# Patient Record
Sex: Male | Born: 1950
Health system: Southern US, Community
[De-identification: ages and names within clinical notes are randomized; demographics above are authoritative.]

## PROBLEM LIST (undated history)

## (undated) DIAGNOSIS — C801 Malignant (primary) neoplasm, unspecified: Secondary | ICD-10-CM

## (undated) DIAGNOSIS — K6289 Other specified diseases of anus and rectum: Secondary | ICD-10-CM

## (undated) DIAGNOSIS — IMO0001 Reserved for inherently not codable concepts without codable children: Secondary | ICD-10-CM

## (undated) HISTORY — DX: Malignant (primary) neoplasm, unspecified: C80.1

## (undated) HISTORY — DX: Other specified diseases of anus and rectum: K62.89

## (undated) HISTORY — PX: PROSTATECTOMY: SHX69

## (undated) HISTORY — DX: Reserved for inherently not codable concepts without codable children: IMO0001

## (undated) HISTORY — PX: COLONOSCOPY: SHX174

---

## 2001-06-25 ENCOUNTER — Ambulatory Visit (HOSPITAL_COMMUNITY): Admission: RE | Admit: 2001-06-25 | Discharge: 2001-06-25 | Payer: Self-pay | Admitting: Internal Medicine

## 2001-06-25 ENCOUNTER — Encounter: Payer: Self-pay | Admitting: Internal Medicine

## 2001-08-06 ENCOUNTER — Encounter: Admission: RE | Admit: 2001-08-06 | Discharge: 2001-09-24 | Payer: Self-pay | Admitting: Orthopaedic Surgery

## 2003-05-07 ENCOUNTER — Emergency Department (HOSPITAL_COMMUNITY): Admission: EM | Admit: 2003-05-07 | Discharge: 2003-05-07 | Payer: Self-pay | Admitting: Emergency Medicine

## 2003-08-09 ENCOUNTER — Emergency Department (HOSPITAL_COMMUNITY): Admission: EM | Admit: 2003-08-09 | Discharge: 2003-08-09 | Payer: Self-pay | Admitting: Emergency Medicine

## 2005-05-05 ENCOUNTER — Ambulatory Visit: Payer: Self-pay | Admitting: Internal Medicine

## 2005-05-24 ENCOUNTER — Encounter (INDEPENDENT_AMBULATORY_CARE_PROVIDER_SITE_OTHER): Payer: Self-pay | Admitting: Internal Medicine

## 2005-06-02 ENCOUNTER — Ambulatory Visit: Payer: Self-pay | Admitting: Internal Medicine

## 2005-06-02 ENCOUNTER — Ambulatory Visit (HOSPITAL_COMMUNITY): Admission: RE | Admit: 2005-06-02 | Discharge: 2005-06-02 | Payer: Self-pay | Admitting: Internal Medicine

## 2006-09-05 ENCOUNTER — Ambulatory Visit: Payer: Self-pay | Admitting: Family Medicine

## 2006-10-23 ENCOUNTER — Ambulatory Visit: Payer: Self-pay | Admitting: Family Medicine

## 2007-06-11 ENCOUNTER — Ambulatory Visit (HOSPITAL_COMMUNITY): Admission: RE | Admit: 2007-06-11 | Discharge: 2007-06-11 | Payer: Self-pay | Admitting: Family Medicine

## 2007-10-28 ENCOUNTER — Ambulatory Visit: Payer: Self-pay | Admitting: Gastroenterology

## 2007-10-28 DIAGNOSIS — R1310 Dysphagia, unspecified: Secondary | ICD-10-CM | POA: Insufficient documentation

## 2007-10-29 ENCOUNTER — Ambulatory Visit: Payer: Self-pay | Admitting: Gastroenterology

## 2007-11-29 ENCOUNTER — Ambulatory Visit: Payer: Self-pay | Admitting: Gastroenterology

## 2007-11-29 DIAGNOSIS — K219 Gastro-esophageal reflux disease without esophagitis: Secondary | ICD-10-CM | POA: Insufficient documentation

## 2008-01-02 ENCOUNTER — Telehealth: Payer: Self-pay | Admitting: Gastroenterology

## 2008-03-24 ENCOUNTER — Telehealth: Payer: Self-pay | Admitting: Gastroenterology

## 2008-03-24 ENCOUNTER — Ambulatory Visit: Payer: Self-pay | Admitting: Gastroenterology

## 2008-03-24 ENCOUNTER — Ambulatory Visit (HOSPITAL_COMMUNITY): Admission: RE | Admit: 2008-03-24 | Discharge: 2008-03-24 | Payer: Self-pay | Admitting: Gastroenterology

## 2008-03-25 ENCOUNTER — Telehealth: Payer: Self-pay | Admitting: Gastroenterology

## 2008-03-31 ENCOUNTER — Ambulatory Visit (HOSPITAL_COMMUNITY): Admission: RE | Admit: 2008-03-31 | Discharge: 2008-03-31 | Payer: Self-pay | Admitting: Gastroenterology

## 2008-04-13 ENCOUNTER — Ambulatory Visit: Payer: Self-pay | Admitting: Gastroenterology

## 2008-06-01 ENCOUNTER — Ambulatory Visit: Payer: Self-pay | Admitting: Internal Medicine

## 2008-06-01 DIAGNOSIS — D126 Benign neoplasm of colon, unspecified: Secondary | ICD-10-CM | POA: Insufficient documentation

## 2008-06-01 DIAGNOSIS — M1990 Unspecified osteoarthritis, unspecified site: Secondary | ICD-10-CM | POA: Insufficient documentation

## 2008-06-01 DIAGNOSIS — J309 Allergic rhinitis, unspecified: Secondary | ICD-10-CM | POA: Insufficient documentation

## 2008-06-01 DIAGNOSIS — J189 Pneumonia, unspecified organism: Secondary | ICD-10-CM | POA: Insufficient documentation

## 2008-06-01 DIAGNOSIS — H9319 Tinnitus, unspecified ear: Secondary | ICD-10-CM | POA: Insufficient documentation

## 2008-06-01 LAB — CONVERTED CEMR LAB
Basophils Absolute: 0 10*3/uL (ref 0.0–0.1)
Basophils Relative: 0.6 % (ref 0.0–3.0)
CO2: 31 meq/L (ref 19–32)
Calcium: 8.9 mg/dL (ref 8.4–10.5)
Cholesterol: 159 mg/dL (ref 0–200)
Creatinine, Ser: 0.8 mg/dL (ref 0.4–1.5)
Eosinophils Absolute: 0.1 10*3/uL (ref 0.0–0.7)
GFR calc non Af Amer: 106 mL/min
HCT: 41.6 % (ref 39.0–52.0)
Hemoglobin: 14.1 g/dL (ref 13.0–17.0)
MCHC: 33.9 g/dL (ref 30.0–36.0)
MCV: 90.1 fL (ref 78.0–100.0)
Neutro Abs: 2.2 10*3/uL (ref 1.4–7.7)
PSA: 4.3 ng/mL — ABNORMAL HIGH (ref 0.10–4.00)
RBC: 4.62 M/uL (ref 4.22–5.81)

## 2008-06-03 ENCOUNTER — Encounter: Payer: Self-pay | Admitting: Internal Medicine

## 2008-07-06 ENCOUNTER — Ambulatory Visit: Payer: Self-pay | Admitting: Internal Medicine

## 2008-07-06 DIAGNOSIS — R972 Elevated prostate specific antigen [PSA]: Secondary | ICD-10-CM | POA: Insufficient documentation

## 2008-09-04 ENCOUNTER — Ambulatory Visit: Payer: Self-pay | Admitting: Endocrinology

## 2008-09-10 ENCOUNTER — Telehealth: Payer: Self-pay | Admitting: Endocrinology

## 2008-09-10 ENCOUNTER — Ambulatory Visit (HOSPITAL_COMMUNITY): Admission: RE | Admit: 2008-09-10 | Discharge: 2008-09-10 | Payer: Self-pay | Admitting: Endocrinology

## 2008-09-10 ENCOUNTER — Ambulatory Visit: Payer: Self-pay | Admitting: Endocrinology

## 2008-09-10 DIAGNOSIS — M549 Dorsalgia, unspecified: Secondary | ICD-10-CM | POA: Insufficient documentation

## 2009-03-22 ENCOUNTER — Ambulatory Visit: Payer: Self-pay | Admitting: Internal Medicine

## 2009-03-22 LAB — CONVERTED CEMR LAB
Anti Nuclear Antibody(ANA): NEGATIVE
PSA: 5.14 ng/mL — ABNORMAL HIGH (ref 0.10–4.00)
TSH: 1.48 microintl units/mL (ref 0.35–5.50)

## 2009-03-23 ENCOUNTER — Telehealth: Payer: Self-pay | Admitting: Internal Medicine

## 2009-05-07 ENCOUNTER — Encounter: Payer: Self-pay | Admitting: Internal Medicine

## 2009-06-14 ENCOUNTER — Encounter: Payer: Self-pay | Admitting: Internal Medicine

## 2009-07-14 ENCOUNTER — Encounter: Payer: Self-pay | Admitting: Internal Medicine

## 2009-08-05 ENCOUNTER — Encounter: Payer: Self-pay | Admitting: Internal Medicine

## 2009-09-21 DIAGNOSIS — C801 Malignant (primary) neoplasm, unspecified: Secondary | ICD-10-CM

## 2009-09-21 HISTORY — DX: Malignant (primary) neoplasm, unspecified: C80.1

## 2009-10-07 ENCOUNTER — Emergency Department (HOSPITAL_COMMUNITY)
Admission: EM | Admit: 2009-10-07 | Discharge: 2009-10-07 | Payer: Self-pay | Source: Home / Self Care | Admitting: Emergency Medicine

## 2010-05-19 ENCOUNTER — Encounter (INDEPENDENT_AMBULATORY_CARE_PROVIDER_SITE_OTHER): Payer: Self-pay | Admitting: *Deleted

## 2010-07-06 ENCOUNTER — Ambulatory Visit: Payer: Self-pay | Admitting: Internal Medicine

## 2010-08-04 ENCOUNTER — Ambulatory Visit (HOSPITAL_COMMUNITY)
Admission: RE | Admit: 2010-08-04 | Discharge: 2010-08-04 | Payer: Self-pay | Source: Home / Self Care | Attending: Internal Medicine | Admitting: Internal Medicine

## 2010-08-04 ENCOUNTER — Ambulatory Visit: Admit: 2010-08-04 | Payer: Self-pay | Admitting: Internal Medicine

## 2010-08-04 HISTORY — PX: COLONOSCOPY: SHX174

## 2010-08-14 ENCOUNTER — Encounter: Payer: Self-pay | Admitting: Endocrinology

## 2010-08-22 NOTE — Op Note (Signed)
  NAMEKERIN, CECCHI              ACCOUNT NO.:  [id number]  MEDICAL RECORD NO.:  [id number]          PATIENT TYPE:  AMB  LOCATION:  DAY                           FACILITY:  [facility]  PHYSICIAN:  Lionel December, M.D.    DATE OF BIRTH:  1951-06-03  DATE OF PROCEDURE:  08/04/2010 DATE OF DISCHARGE:  08/04/2010                              OPERATIVE REPORT   PROCEDURE:  Colonoscopy.  INDICATION:  David Pham is a 60 year old Caucasian male who has recurrent hematochezia felt to be secondary to hemorrhoid.  He denies diarrhea and/or constipation.  His last colonoscopy was about 6 years ago.  FAMILY HISTORY:  Negative for colorectal carcinoma.  PERSONAL HISTORY:  Positive for prostate CA for which he had radical prostatectomy in March of last year and is doing well.  Procedure risks were reviewed with the patient and informed consent was obtained.  MEDICATIONS FOR CONSCIOUS SEDATION: 1. Demerol 50 mg IV. 2. Versed 6 mg IV in divided dose.FINDINGS:  Procedure performed in endoscopy suite.  The patient's vital signs and O2 sats were monitored during the procedure and remained stable.  The patient was placed in left lateral recumbent position and rectal examination performed.  No abnormality noted on external or digital exam.  Pentax videoscope was placed through rectum and advanced under vision into sigmoid colon beyond.  Preparation was excellent. Scope was passed into cecum which was identified by appendiceal orifice and ileocecal valve.  Pictures were taken for the record.  As the scope was withdrawn, colonic mucosa was carefully examined and was normal throughout.  Rectal mucosa similarly was normal.  Scope was retroflexed to examine anorectal junction and small to moderate size hemorrhoids noted below the dentate line.  Endoscope was straightened and withdrawn. Withdrawal time was 9 minutes.  The patient tolerated the procedure well.  FINAL DIAGNOSIS:  External hemorrhoids, otherwise  normal colonoscopy.  RECOMMENDATIONS: 1. High-fiber diet plus fiber supplement 3-4 g daily. 2. Mycolog II cream to be applied to perianal area/anal canal twice a     day for 2 weeks thereafter p.r.n.  Prescription given for 60 g.  If     patient's hematochezia persist, he may need further intervention.  Thank you very much.     Lionel December, M.D.     NR/MEDQ  D:  08/04/2010  T:  08/05/2010  Job:  [id number]  cc:   Lorin Picket A. Gerda Diss, MD Fax: [phone]  Electronically Signed by Lionel December M.D. on 08/22/2010 07:18:49 AM

## 2010-08-23 NOTE — Letter (Signed)
Summary: Recall, Screening Colonoscopy Only  [facility]  [address]   [place], [place] [address]   Phone: [phone]  Fax: [phone]    May 19, 2010  CAEDON BOND [address] [place], [place]  [address] 1950-08-08   Dear Mr. GEIMAN,   Our records indicate it is time to schedule your colonoscopy.    Please call our office at [phone] and ask for the nurse.   Thank you,    Hendricks Limes, LPN Cloria Spring, LPN  [facility] Ph: [phone]   Fax: [phone]   Appended Document: Recall, Screening Colonoscopy Only Patient called an said he will be following Dr Karilyn Cota

## 2010-08-23 NOTE — Letter (Signed)
Summary: [facility]   Imported By: Sherian Rein 08/09/2009 13:53:11  _____________________________________________________________________  External Attachment:    Type:   Image     Comment:   External Document

## 2010-08-23 NOTE — Letter (Signed)
Summary: Office Visit / [facility]   Office Visit / [facility]   Imported By: Lennie Odor 07/26/2009 14:10:58  _____________________________________________________________________  External Attachment:    Type:   Image     Comment:   External Document

## 2010-12-06 NOTE — Assessment & Plan Note (Signed)
[facility]                         GASTROENTEROLOGY OFFICE NOTE   JOSPEH, MANGEL                     MRN:          [id number]  DATE:10/28/2007                            DOB:          1950/11/05    REASON FOR CONSULTATION:  Cough and dysphagia.   Mr. Birdwell is a 60 year old white male referred through the courtesy of  Dr. Andi Devon for evaluation.  Mr. Liew complains with a constant need  to clear his throat.  He has had burning and indigestion which is  greatly improved with Nexium.  He does have dysphagia to solids and  liquids where he describes solids taking a long time to go down.  He  also apparently has a history of colon polyps and was last examined 3  years ago.   PAST MEDICAL HISTORY:  Unremarkable.   FAMILY HISTORY:  Noncontributory.   MEDICATIONS:  Include Nexium, baby aspirin, Afrin spray.    He is allergic to PENICILLIN.   He does not smoke.  He drinks rarely.  He is single, is a Naval architect.   REVIEW OF SYSTEMS:  Positive for joint pains and some tinnitus and  muscle cramps.   PHYSICAL EXAMINATION:  Pulse 72.  Blood pressure 122/74.  Weight 167.  HEENT:  EOMI.  PERRLA.  Sclerae are anicteric.  Conjunctivae are pink.  NECK:  Supple without thyromegaly, adenopathy or carotid bruits.  CHEST:  Clear to auscultation and percussion without adventitious  sounds.  CARDIAC:  Regular rhythm; normal S1 S2.  There are no murmurs, gallops  or rubs.  ABDOMEN:  Bowel sounds are normoactive.  Abdomen is soft, nontender and  nondistended.  There are no abdominal masses, tenderness, splenic  enlargement or hepatomegaly.  EXTREMITIES:  Full range of motion.  No cyanosis, clubbing or edema.  RECTAL:  Deferred.   IMPRESSION:  1. Gastroesophageal reflux disease.  2. Throat disturbances.  This certainly could be related to      gastroesophageal reflux disease.  Postnasal drip is also a concern.  3. Dysphagia - rule out esophageal  stricture.  4. History of colon polyps.   RECOMMENDATION:  1. Continue Nexium.  2. Upper endoscopy with dilation as indicated.  3. A followup colonoscopy in approximately 2 years.     Barbette Hair. Arlyce Dice, MD,FACG  Electronically Signed    RDK/MedQ  DD: 10/28/2007  DT: 10/28/2007  Job #: [id number]   cc:   Gabriel Earing, M.D.

## 2010-12-06 NOTE — Assessment & Plan Note (Signed)
[facility]                                 ON-CALL NOTE   MALIKIAH, DEBARR                       MRN:          [id number]  DATE:03/25/2008                            DOB:          Aug 21, 1950    Mr. Pruss called tonight stating he has abdominal bloating.  He  underwent a esophageal dilatation by myself yesterday.  He has no  abdominal pain.  He has minimal dysphagia.   I instructed him to take Mylanta-II or Maalox plus, and to call the  office in 3-4 days if he is not improved.     Barbette Hair. Arlyce Dice, MD,FACG  Electronically Signed    RDK/MedQ  DD: 03/25/2008  DT: 03/26/2008  Job #: [id number]

## 2010-12-06 NOTE — Letter (Signed)
October 28, 2007    David Pham   RE:  ZAMERE, PASTERNAK  MRN:  [id number]  /  DOB:  12/02/1950   Dear [name]:   It is my pleasure to have treated you recently as a new patient in my  office.  I appreciate your confidence and the opportunity to participate  in your care.   Since I do have a busy inpatient endoscopy schedule and office schedule,  my office hours vary weekly.  I am, however, available for emergency  calls every day through my office.  If I cannot promptly meet an urgent  office appointment, another one of our gastroenterologists will be able  to assist you.   My well-trained staff are prepared to help you at all times.  For  emergencies after office hours, a physician from our gastroenterology  section is always available through my 24-hour answering service.   While you are under my care, I encourage discussion of your questions  and concerns, and I will be happy to return your calls as soon as I am  available.   Once again, I welcome you as a new patient and I look forward to a happy  and healthy relationship.    Sincerely,      Barbette Hair. Arlyce Dice, MD,FACG  Electronically Signed   RDK/MedQ  DD: 10/28/2007  DT: 10/28/2007  Job #: [id number]

## 2010-12-06 NOTE — Letter (Signed)
October 28, 2007    Isaiah Serge   RE:  SAMAJ, WESSELLS  MRN:  [id number]  /  DOB:  03-05-1951   Dear Mr. Hart Rochester:   It is my pleasure to have treated you recently as a new patient in my  office.  I appreciate your confidence and the opportunity to participate  in your care.   Since I do have a busy inpatient endoscopy schedule and office schedule,  my office hours vary weekly.  I am, however, available for emergency  calls every day through my office.  If I cannot promptly meet an urgent  office appointment, another one of our gastroenterologists will be able  to assist you.   My well-trained staff are prepared to help you at all times.  For  emergencies after office hours, a physician from our gastroenterology  section is always available through my 24-hour answering service.   While you are under my care, I encourage discussion of your questions  and concerns, and I will be happy to return your calls as soon as I am  available.   Once again, I welcome you as a new patient and I look forward to a happy  and healthy relationship.    Sincerely,      Barbette Hair. Arlyce Dice, MD,FACG  Electronically Signed   RDK/MedQ  DD: 10/28/2007  DT: 10/28/2007  Job #: [id number]

## 2010-12-06 NOTE — Letter (Signed)
October 28, 2007    Gabriel Earing, M.D.  [address]  [place], [place] [address]   RE:  David Pham, David Pham  MRN:  [id number]  /  DOB:  1951/02/15   Dear Dr. Andi Devon:   Upon your kind referral, I had the pleasure of evaluating your patient  and I am pleased to offer my findings.  I saw David Pham in the  office today.  Enclosed is a copy of my progress note that details my  findings and recommendations.   Thank you for the opportunity to participate in your patient's care.    Sincerely,      Barbette Hair. Arlyce Dice, MD,FACG  Electronically Signed    RDK/MedQ  DD: 10/28/2007  DT: 10/28/2007  Job #: [id number]

## 2010-12-09 NOTE — Op Note (Signed)
David Pham, David Pham              ACCOUNT NO.:  [id number]   MEDICAL RECORD NO.:  [id number]          PATIENT TYPE:  AMB   LOCATION:  DAY                           FACILITY:  [facility]   PHYSICIAN:  Lionel December, M.D.    DATE OF BIRTH:  03-Jul-1951   DATE OF PROCEDURE:  06/02/2005  DATE OF DISCHARGE:                                 OPERATIVE REPORT   PROCEDURE:  Colonoscopy.   INDICATIONS:  David Pham is a 60 year old Caucasian male with intermittent  rectal pain who also has history of colonic adenomas. Last exam was in July  2001. He is undergoing both surveillance and diagnostic colonoscopy.  Procedure risks were reviewed with the patient, and informed consent was  obtained.   PREMEDICATION:  Demerol 25 mg IV, Versed 4 mg IV.   FINDINGS:  Procedure performed in endoscopy suite. The patient's vital signs  and O2 saturation were monitored during the procedure and remained stable.  The patient was placed in left lateral position and rectal examination  performed. No abnormality noted on external or digital exam. Olympus  videoscope was placed in rectum and advanced under vision into sigmoid colon  and beyond. Preparation was satisfactory. She had stool in some areas which  had to be washed away with sterile water. Scope was passed into cecum which  was identified by ileocecal valve and appendiceal orifice. Pictures taken  for the record. As the scope was withdrawn, colonic mucosa was carefully  examined. There was a small polyp at rectosigmoid junction which was ablated  via cold biopsy. Rectal mucosa was normal. Scope was retroflexed to examine  anorectal junction, and small hemorrhoids were noted below the dentate line.  Endoscope was straightened and withdrawn. The patient tolerated the  procedure well.   FINAL DIAGNOSES:  1.  Small polyp ablated via cold biopsy from rectosigmoid junction.  2.  Small external hemorrhoids.  3.  I suspect he may be experiencing rectal spasms.   RECOMMENDATIONS:  1.  High-fiber diet.  2.  FiberChoice chew 2 tablets q.d.  3.  Levsin SL t.i.d. p.r.n. pain.  4.  I will be contacting the patient with biopsy results and further      recommendations.      Lionel December, M.D.  Electronically Signed     NR/MEDQ  D:  06/02/2005  T:  06/02/2005  Job:  [id number]   cc:   Wende Crease, M.D.  [place], [place]

## 2010-12-09 NOTE — Consult Note (Signed)
David Pham, David Pham              ACCOUNT NO.:  [id number]   MEDICAL RECORD NO.:  [id number]          PATIENT TYPE:  AMB   LOCATION:                                FACILITY:  [facility]   PHYSICIAN:  Lionel December, M.D.    DATE OF BIRTH:  06/14/1951   DATE OF CONSULTATION:  05/05/2005  DATE OF DISCHARGE:                                   CONSULTATION   CHIEF COMPLAINT:  Rectal pain, history of colon polyps.   REQUESTING PHYSICIAN:  Dr. Doyne Keel.   HISTORY OF PRESENT ILLNESS:  The patient is a 60 year old Caucasian  gentleman who presents today for further evaluation of the above stated  symptoms. He has a history of small colonic adenomas removed by Dr. Karilyn Cota  in July 2001. At that time, he also had an EGD which revealed mild changes  of reflux esophagitis and gastritis. Helicobacter pylori was positive, and  he was treated. He also had a normal terminal ileoscopy. Over the last  couple of months, he has had some problems with rectal pain. He was treated  for prostatitis, and the patient seemed to get some better, but he continues  to have mild symptoms. He has had some hematochezia which he feels is  related to hemorrhoids. His bowel movements are regular. No abdominal pain,  nausea or vomiting. Heartburn controlled on Nexium. No dysphagia,  odynophagia.   CURRENT MEDICATIONS:  1.  Tylenol 1 to 2 p.r.n.  2.  Multivitamin.  3.  Vitamin E daily.  4.  Aspirin 81 mg daily.  5.  Nexium 40 mg daily.   ALLERGIES:  PENICILLIN.   PAST MEDICAL HISTORY:  1.  Sinusitis.  2.  Gastroesophageal reflux disease. He gives a history of ulcers. Last EGD      and colonoscopy as outlined above in 2001.   FAMILY HISTORY:  Negative for colorectal cancer or chronic GI illnesses.   SOCIAL HISTORY:  He is divorced and has two sons. He is employed with [employer] services. He does not smoke, but he does chew tobacco. He drinks beer  every couple of days, usually one or two at a time. He does have a  history  of heavy alcohol use in the past.   REVIEW OF SYSTEMS:  GASTROINTESTINAL:  See HPI for GI. CONSTITUTIONAL:  No  weight loss. CARDIOPULMONARY:  No chest pain or shortness of breath.   PHYSICAL EXAMINATION:  VITAL SIGNS:  Weight 164, height 5 feet 11.  Temperature 97.7, blood pressure 124/82, pulse 84.  GENERAL:  Pleasant, well-developed, well-nourished, Caucasian male in no  acute distress.  SKIN:  Warm and dry. No jaundice.  HEENT:  Pupils are equal, round, and reactive to light. Conjunctivae are  pink. Sclerae are nonicteric. Oropharyngeal mucosa moist and pink. No  lesions, erythema, or exudate. No lymphadenopathy or thyromegaly.  LUNGS:  Clear to auscultation.  CARDIAC:  Reveals regular rate and rhythm. Normal S1 and S2. No murmurs,  rubs, or gallops.  ABDOMEN:  Positive bowel sounds, soft, nontender, nondistended. No  organomegaly or masses. No rebound tenderness or guarding. No abdominal  bruits  or hernias.  EXTREMITIES:  No edema.  RECTAL:  Deferred.   IMPRESSION:  The patient is a 60 year old Caucasian gentleman with history  of rectal pain, intermittent hematochezia possibly due to benign anorectal  source, history of colonic adenomas who presents today for possible  colonoscopy. It has been five years since his last exam, and it would be  reasonable at this time given his rectal pain to proceed with colonoscopy  for further evaluation as well surveillance purposes. Gastroesophageal  reflux disease, well controlled on Nexium.   PLAN:  1.  Colonoscopy in the near future.  2.  He is to continue Nexium for his acid reflux disease.   I would like to thank Dr. Doyne Keel for allowing [place] to take part in the care  of this patient.      Tana Coast, P.A.      Lionel December, M.D.  Electronically Signed    LL/MEDQ  D:  05/05/2005  T:  05/05/2005  Job:  [id number]

## 2011-03-15 ENCOUNTER — Encounter (INDEPENDENT_AMBULATORY_CARE_PROVIDER_SITE_OTHER): Payer: Self-pay

## 2011-05-15 ENCOUNTER — Ambulatory Visit (HOSPITAL_COMMUNITY)
Admission: RE | Admit: 2011-05-15 | Discharge: 2011-05-15 | Disposition: A | Discharge status: Home / Self Care | Payer: Managed Care, Other (non HMO) | Source: Ambulatory Visit | Attending: Family Medicine | Admitting: Family Medicine

## 2011-05-15 ENCOUNTER — Other Ambulatory Visit: Payer: Self-pay | Admitting: Family Medicine

## 2011-05-15 DIAGNOSIS — R059 Cough, unspecified: Secondary | ICD-10-CM

## 2011-05-15 DIAGNOSIS — R05 Cough: Secondary | ICD-10-CM | POA: Insufficient documentation

## 2013-05-24 ENCOUNTER — Encounter: Payer: Self-pay | Admitting: *Deleted

## 2013-05-27 ENCOUNTER — Ambulatory Visit (INDEPENDENT_AMBULATORY_CARE_PROVIDER_SITE_OTHER): Payer: Managed Care, Other (non HMO) | Admitting: Family Medicine

## 2013-05-27 ENCOUNTER — Encounter: Payer: Self-pay | Admitting: Family Medicine

## 2013-05-27 VITALS — BP 120/80 | Ht 68.0 in | Wt 172.0 lb

## 2013-05-27 DIAGNOSIS — D2360 Other benign neoplasm of skin of unspecified upper limb, including shoulder: Secondary | ICD-10-CM

## 2013-05-27 DIAGNOSIS — D2362 Other benign neoplasm of skin of left upper limb, including shoulder: Secondary | ICD-10-CM

## 2013-05-27 DIAGNOSIS — R5381 Other malaise: Secondary | ICD-10-CM

## 2013-05-27 DIAGNOSIS — M79609 Pain in unspecified limb: Secondary | ICD-10-CM

## 2013-05-27 DIAGNOSIS — M79605 Pain in left leg: Secondary | ICD-10-CM

## 2013-05-27 DIAGNOSIS — M79604 Pain in right leg: Secondary | ICD-10-CM

## 2013-05-27 DIAGNOSIS — Z79899 Other long term (current) drug therapy: Secondary | ICD-10-CM

## 2013-05-27 DIAGNOSIS — E785 Hyperlipidemia, unspecified: Secondary | ICD-10-CM

## 2013-05-27 MED ORDER — MELOXICAM 15 MG PO TABS: 15.0000 mg | ORAL_TABLET | Freq: Every day | ORAL | Status: DC

## 2013-05-27 NOTE — Progress Notes (Signed)
  Subjective:    Patient ID: David Pham, male    DOB: 1951-01-25, 62 y.o.   MRN: [id number]  Leg Pain  The incident occurred more than 1 week ago. There was no injury mechanism. The pain is present in the left leg and right leg. The quality of the pain is described as aching. The pain is at a severity of 4/10. The pain is moderate. The pain has been intermittent since onset. He reports no foreign bodies present. Nothing aggravates the symptoms. He has tried acetaminophen for the symptoms. The treatment provided no relief.   Patient states that he has a wart on his left arm. It has been present for about 1-2 months.   Patient also has had surgery for kidney stones in May or June of this year.   Patient denies any injury. Nothing he knows his caused it.  Review of Systems He relates bilateral lower leg pain around the lower calf around the ankles around the feet denies any injury denies any other particular troubles.    Objective:   Physical Exam Lungs are clear hearts regular lower spine normal pulses present in both feet calves nontender knees are normal       Assessment & Plan:  I doubt claudication. I believe there is a possibility that there could be some underlying spinal stenosis present that could be causing some of this issue. I do not feel that the patient needs any type of intervention currently other than trying anti-inflammatory for the next month if he's not doing better with that then consider MRI of the lumbar spine especially if symptoms are worse  Labs ordered followup in 3-4 weeks  Patient also has a abnormal area on his arm that I believe isa acanthoma, needs dermatology referral

## 2013-06-17 LAB — CBC WITH DIFFERENTIAL/PLATELET
Basophils Absolute: 0 10*3/uL (ref 0.0–0.1)
Lymphocytes Relative: 26 % (ref 12–46)
Lymphs Abs: 1.5 10*3/uL (ref 0.7–4.0)
Neutro Abs: 3.6 10*3/uL (ref 1.7–7.7)
Neutrophils Relative %: 64 % (ref 43–77)
Platelets: 197 10*3/uL (ref 150–400)
RBC: 4.5 MIL/uL (ref 4.22–5.81)
RDW: 14 % (ref 11.5–15.5)
WBC: 5.7 10*3/uL (ref 4.0–10.5)

## 2013-06-18 LAB — LIPID PANEL
HDL: 35 mg/dL — ABNORMAL LOW (ref >39)
LDL Cholesterol: 81 mg/dL (ref 0–99)
Total CHOL/HDL Ratio: 4.6 Ratio

## 2013-06-18 LAB — BASIC METABOLIC PANEL
CO2: 27 mEq/L (ref 19–32)
Calcium: 9 mg/dL (ref 8.4–10.5)
Glucose, Bld: 87 mg/dL (ref 70–99)
Potassium: 3.7 mEq/L (ref 3.5–5.3)
Sodium: 141 mEq/L (ref 135–145)

## 2013-06-18 LAB — TSH: TSH: 1.649 u[IU]/mL (ref 0.350–4.500)

## 2013-06-24 ENCOUNTER — Encounter: Payer: Self-pay | Admitting: Family Medicine

## 2013-06-24 ENCOUNTER — Ambulatory Visit (INDEPENDENT_AMBULATORY_CARE_PROVIDER_SITE_OTHER): Payer: Managed Care, Other (non HMO) | Admitting: Family Medicine

## 2013-06-24 VITALS — BP 132/82 | Ht 68.0 in | Wt 173.0 lb

## 2013-06-24 DIAGNOSIS — E785 Hyperlipidemia, unspecified: Secondary | ICD-10-CM

## 2013-06-24 DIAGNOSIS — M79605 Pain in left leg: Secondary | ICD-10-CM

## 2013-06-24 DIAGNOSIS — M79609 Pain in unspecified limb: Secondary | ICD-10-CM

## 2013-06-24 NOTE — Progress Notes (Signed)
   Subjective:    Patient ID: David Pham, male    DOB: 09-15-1950, 62 y.o.   MRN: [id number]  HPI Patient arrives for a follow up on leg pain and to discuss lab results. Patient stated he was not fasting when he had cholesterol test drawn.   25 minutes spent discussing cholesterol. Also has leg pain comes and goes and that he could go ahead with the Mobic to help out with with. He also had his lab work discussed with him today in detail. Patient denies rectal bleeding chest tightness pressure pain shortness breath. Review of Systems      Objective:   Physical Exam Patient had a small spot on the left tonsil he states he is seen in ENT in the past for this he wanted to make sure it still looked okay. It looks sebaceous but no sign of any type of tumor. It's approximately 3 mm x 3 mm on the left tonsillar region. I told him that we would recheck this again when he follows up in 6 months if he gets bigger or causes any pain followup sooner.  Lungs clear heart regular pulse normal blood pressure good abdomen soft       Assessment & Plan:  Tonsillar area will recheck in 6 months Triglycerides somewhat elevated because he was not fasting when he had this checked otherwise cholesterol looked good Metabolic 7 lipid kidney functions look good blood pressure good Followup 6 months He may use MOBIC for leg pain prepped a 3-4 weeks if any systemic effects from the Mobic stop it

## 2013-10-27 ENCOUNTER — Ambulatory Visit (INDEPENDENT_AMBULATORY_CARE_PROVIDER_SITE_OTHER): Payer: Managed Care, Other (non HMO) | Admitting: Family Medicine

## 2013-10-27 ENCOUNTER — Encounter: Payer: Self-pay | Admitting: Family Medicine

## 2013-10-27 VITALS — BP 132/92 | Ht 68.0 in | Wt 172.0 lb

## 2013-10-27 DIAGNOSIS — D229 Melanocytic nevi, unspecified: Secondary | ICD-10-CM

## 2013-10-27 DIAGNOSIS — D239 Other benign neoplasm of skin, unspecified: Secondary | ICD-10-CM

## 2013-10-27 DIAGNOSIS — Z0000 Encounter for general adult medical examination without abnormal findings: Secondary | ICD-10-CM

## 2013-10-27 DIAGNOSIS — J309 Allergic rhinitis, unspecified: Secondary | ICD-10-CM

## 2013-10-27 NOTE — Progress Notes (Signed)
   Subjective:    Patient ID: David Pham, male    DOB: 10-22-1950, 63 y.o.   MRN: [id number]  HPI Patient is here today for a wellness exam.   Patient has been dealing with sinus issues as well. The patient comes in today for a wellness visit.  A review of their health history was completed.  A review of medications was also completed. Any necessary refills were discussed. Sensible healthy diet was discussed. Importance of minimizing excessive salt and carbohydrates was also discussed. Safety was stressed including driving, activities at work and at home where applicable. Importance of regular physical activity for overall health was discussed. Preventative measures appropriate for age were discussed. Time was spent with the patient discussing any concerns they have about their well-being.    Review of Systems  Constitutional: Negative for fever, activity change and appetite change.  HENT: Negative for congestion and rhinorrhea.   Eyes: Negative for discharge.  Respiratory: Positive for cough. Negative for wheezing.   Cardiovascular: Negative for chest pain.  Gastrointestinal: Negative for vomiting, abdominal pain and blood in stool.  Genitourinary: Negative for frequency and difficulty urinating.  Musculoskeletal: Negative for neck pain.  Skin: Negative for rash.  Allergic/Immunologic: Negative for environmental allergies and food allergies.  Neurological: Negative for weakness and headaches.  Psychiatric/Behavioral: Negative for agitation.       Objective:   Physical Exam  Constitutional: He appears well-developed and well-nourished.  HENT:  Head: Normocephalic and atraumatic.  Right Ear: External ear normal.  Left Ear: External ear normal.  Nose: Nose normal.  Mouth/Throat: Oropharynx is clear and moist.  Eyes: EOM are normal. Pupils are equal, round, and reactive to light.  Neck: Normal range of motion. Neck supple. No thyromegaly present.  Cardiovascular:  Normal rate, regular rhythm and normal heart sounds.   No murmur heard. Pulmonary/Chest: Effort normal and breath sounds normal. No respiratory distress. He has no wheezes.  Abdominal: Soft. Bowel sounds are normal. He exhibits no distension and no mass. There is no tenderness.  Genitourinary: Penis normal.  Musculoskeletal: Normal range of motion. He exhibits no edema.  Lymphadenopathy:    He has no cervical adenopathy.  Neurological: He is alert. He exhibits normal muscle tone.  Skin: Skin is warm and dry. No erythema.  Psychiatric: He has a normal mood and affect. His behavior is normal. Judgment normal.   Oral exam thoroughly done no masses or growths sore eructations were seen patient has been having some postnasal drip with some coughing. He will be using allergy medicines along with omeprazole to see feeding keep it under control. No need for any tests currently.  Patient also has abnormal mole on the right posterior back I recommend that he sees dermatology to have this removed.     Assessment & Plan:  Abnormal mole on the right side of his back I recommend he sees Dr. Nevada Crane for removal of this. Wellness exam completed dietary measures safety measures all discussed. Laboratory work ordered awaiting results Up-to-date on colonoscopy currently Followed by urologist had prostatectomy in 2011.

## 2013-10-30 ENCOUNTER — Encounter: Payer: Self-pay | Admitting: Family Medicine

## 2013-10-30 LAB — HEPATIC FUNCTION PANEL
ALT: 17 U/L (ref 0–53)
AST: 16 U/L (ref 0–37)
Albumin: 3.8 g/dL (ref 3.5–5.2)
Alkaline Phosphatase: 59 U/L (ref 39–117)
BILIRUBIN DIRECT: 0.1 mg/dL (ref 0.0–0.3)
Indirect Bilirubin: 0.6 mg/dL (ref 0.2–1.2)
Total Bilirubin: 0.7 mg/dL (ref 0.2–1.2)
Total Protein: 6.3 g/dL (ref 6.0–8.3)

## 2013-10-30 LAB — LIPID PANEL
Cholesterol: 154 mg/dL (ref 0–200)
HDL: 37 mg/dL — ABNORMAL LOW (ref >39)
LDL Cholesterol: 95 mg/dL (ref 0–99)
Total CHOL/HDL Ratio: 4.2 Ratio
Triglycerides: 109 mg/dL (ref <150)
VLDL: 22 mg/dL (ref 0–40)

## 2013-10-30 LAB — BASIC METABOLIC PANEL
BUN: 11 mg/dL (ref 6–23)
CHLORIDE: 104 meq/L (ref 96–112)
CO2: 27 mEq/L (ref 19–32)
Calcium: 8.9 mg/dL (ref 8.4–10.5)
Creat: 0.85 mg/dL (ref 0.50–1.35)
Glucose, Bld: 87 mg/dL (ref 70–99)
POTASSIUM: 4 meq/L (ref 3.5–5.3)
Sodium: 141 mEq/L (ref 135–145)

## 2013-11-04 ENCOUNTER — Encounter: Payer: Self-pay | Admitting: Family Medicine

## 2014-04-07 ENCOUNTER — Ambulatory Visit: Payer: Managed Care, Other (non HMO) | Admitting: Family Medicine

## 2014-05-11 ENCOUNTER — Encounter: Payer: Self-pay | Admitting: Family Medicine

## 2014-05-11 ENCOUNTER — Ambulatory Visit (INDEPENDENT_AMBULATORY_CARE_PROVIDER_SITE_OTHER): Payer: Managed Care, Other (non HMO) | Admitting: Family Medicine

## 2014-05-11 VITALS — BP 140/90 | Ht 68.0 in | Wt 167.0 lb

## 2014-05-11 DIAGNOSIS — M545 Low back pain, unspecified: Secondary | ICD-10-CM

## 2014-05-11 DIAGNOSIS — Z23 Encounter for immunization: Secondary | ICD-10-CM

## 2014-05-11 DIAGNOSIS — Z9079 Acquired absence of other genital organ(s): Secondary | ICD-10-CM

## 2014-05-11 DIAGNOSIS — Z9889 Other specified postprocedural states: Secondary | ICD-10-CM

## 2014-05-11 NOTE — Progress Notes (Signed)
   Subjective:    Patient ID: David Pham, male    DOB: Oct 28, 1950, 63 y.o.   MRN: [id number]  HPI Patient is here today for chronic low back pain. 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 Patient states he has intermittent stiffness in his lower back. Patient denies radiation down the legs.  Patient would also like the flu shot.   Having sinus issues as well.  Denies high fever chills sweats denies severe headaches nausea or vomiting   Review of Systems  Constitutional: Negative for fever and activity change.  HENT: Positive for congestion and rhinorrhea. Negative for ear pain.   Eyes: Negative for discharge.  Respiratory: Positive for cough. Negative for wheezing.   Cardiovascular: Negative for chest pain.       Objective:   Physical Exam Lungs are clear heart reveals normal neck no masses eardrums normal throat normal sinus normal lumbar area no tenderness negative straight leg raise       Assessment & Plan:  Back pain-musculoskeletal-I find no evidence of any type of herniated disc I don't recommend imaging. I recommended this patient do stretching exercises may use anti-inflammatory when necessary I recommended Aleve when necessary  Mild sinus issues viral no antibiotics necessary flu shot today

## 2014-10-22 ENCOUNTER — Ambulatory Visit (INDEPENDENT_AMBULATORY_CARE_PROVIDER_SITE_OTHER): Payer: Managed Care, Other (non HMO) | Admitting: Family Medicine

## 2014-10-22 ENCOUNTER — Encounter: Payer: Self-pay | Admitting: Family Medicine

## 2014-10-22 VITALS — BP 132/86 | Ht 68.0 in | Wt 167.8 lb

## 2014-10-22 DIAGNOSIS — M7542 Impingement syndrome of left shoulder: Secondary | ICD-10-CM | POA: Diagnosis not present

## 2014-10-22 MED ORDER — METHYLPREDNISOLONE ACETATE 40 MG/ML IJ SUSP: 40.0000 mg | Freq: Once | INTRAMUSCULAR | Status: DC

## 2014-10-22 NOTE — Progress Notes (Signed)
   Subjective:    Patient ID: David Pham, male    DOB: 1950-10-16, 64 y.o.   MRN: [id number]  HPI Patient arrives with complaint of left shoulder pain for 3 weeks.  Does a lot of pulling and lifting with job  Recalls no injury  grts a bad deep ache  Hurts with certain motions radiated to mid arm  Hx of cortisone injec in shoulder   Used tylenol prn  mostlly right handed   Review of Systems No headache no chest pain no back pain    Objective:   Physical Exam  Alert vital stable no acute distress lungs clear heart rhythm H&T normal left shoulder positive impingement sign. Positive pain with rotation  Patient was prepped draped injected with 1 mL Depo-Medrol and 2 mL Xylocaine left shoulder      Assessment & Plan:  Impression subacute shoulder pain. Patient cannot tolerate anti-inflammatories. 1 and trial of injection. To follow-up soon for checkup with Dr. Nicki Reaper

## 2014-10-29 ENCOUNTER — Encounter: Payer: Self-pay | Admitting: Family Medicine

## 2014-10-29 ENCOUNTER — Ambulatory Visit (INDEPENDENT_AMBULATORY_CARE_PROVIDER_SITE_OTHER): Payer: Managed Care, Other (non HMO) | Admitting: Family Medicine

## 2014-10-29 VITALS — BP 128/84 | Ht 68.0 in | Wt 164.0 lb

## 2014-10-29 DIAGNOSIS — Z0000 Encounter for general adult medical examination without abnormal findings: Secondary | ICD-10-CM | POA: Diagnosis not present

## 2014-10-29 DIAGNOSIS — R5383 Other fatigue: Secondary | ICD-10-CM

## 2014-10-29 DIAGNOSIS — E785 Hyperlipidemia, unspecified: Secondary | ICD-10-CM

## 2014-10-29 MED ORDER — KETOCONAZOLE 2 % EX CREA: 1.0000 "application " | TOPICAL_CREAM | Freq: Two times a day (BID) | CUTANEOUS | Status: DC | PRN

## 2014-10-29 NOTE — Progress Notes (Signed)
   Subjective:    Patient ID: David Pham, male    DOB: Aug 04, 1950, 64 y.o.   MRN: [id number]  HPI The patient comes in today for a wellness visit.  A review of their health history was completed.  A review of medications was also completed.  Any needed refills: No  Eating habits: Health conscious  Falls/  MVA accidents in past few months: No  Regular exercise: No  Specialist pt sees on regular basis: Prostate doctor  Preventative health issues were discussed.   Additional concerns: Pain, body aches  Patient is up-to-date on colonoscopy. Patient had prostate removed because prostate cancer.  Review of Systems  Constitutional: Negative for fever, activity change and appetite change.  HENT: Negative for congestion and rhinorrhea.   Eyes: Negative for discharge.  Respiratory: Negative for cough and wheezing.   Cardiovascular: Negative for chest pain.  Gastrointestinal: Negative for vomiting, abdominal pain and blood in stool.  Genitourinary: Negative for frequency and difficulty urinating.  Musculoskeletal: Negative for neck pain.  Skin: Negative for rash.  Allergic/Immunologic: Negative for environmental allergies and food allergies.  Neurological: Negative for weakness and headaches.  Psychiatric/Behavioral: Negative for agitation.       Objective:   Physical Exam  Constitutional: He appears well-developed and well-nourished.  HENT:  Head: Normocephalic and atraumatic.  Right Ear: External ear normal.  Left Ear: External ear normal.  Nose: Nose normal.  Mouth/Throat: Oropharynx is clear and moist.  Eyes: EOM are normal. Pupils are equal, round, and reactive to light.  Neck: Normal range of motion. Neck supple. No thyromegaly present.  Cardiovascular: Normal rate, regular rhythm and normal heart sounds.   No murmur heard. Pulmonary/Chest: Effort normal and breath sounds normal. No respiratory distress. He has no wheezes.  Abdominal: Soft. Bowel sounds are  normal. He exhibits no distension and no mass. There is no tenderness.  Genitourinary: Penis normal.  Musculoskeletal: Normal range of motion. He exhibits no edema.  Lymphadenopathy:    He has no cervical adenopathy.  Neurological: He is alert. He exhibits normal muscle tone.  Skin: Skin is warm and dry. No erythema.  Psychiatric: He has a normal mood and affect. His behavior is normal. Judgment normal.          Assessment & Plan:  Wellness exam-safety dietary all discussed. Importance of regular eating. Patient not having any, patients currently  He is followed by specialist for his history of prostate cancer routine lab work ordered patient to follow-up in 6 months time

## 2014-10-30 ENCOUNTER — Encounter: Payer: Self-pay | Admitting: Family Medicine

## 2014-10-30 LAB — LIPID PANEL
Chol/HDL Ratio: 3.6 ratio units (ref 0.0–5.0)
Cholesterol, Total: 162 mg/dL (ref 100–199)
HDL: 45 mg/dL (ref >39)
LDL Calculated: 99 mg/dL (ref 0–99)
Triglycerides: 92 mg/dL (ref 0–149)
VLDL CHOLESTEROL CAL: 18 mg/dL (ref 5–40)

## 2014-10-30 LAB — CBC WITH DIFFERENTIAL/PLATELET
BASOS ABS: 0 10*3/uL (ref 0.0–0.2)
Basos: 0 %
EOS ABS: 0.1 10*3/uL (ref 0.0–0.4)
Eos: 1 %
HEMATOCRIT: 42.8 % (ref 37.5–51.0)
HEMOGLOBIN: 14.7 g/dL (ref 12.6–17.7)
IMMATURE GRANS (ABS): 0 10*3/uL (ref 0.0–0.1)
IMMATURE GRANULOCYTES: 0 %
LYMPHS ABS: 1.7 10*3/uL (ref 0.7–3.1)
Lymphs: 32 %
MCH: 29.3 pg (ref 26.6–33.0)
MCHC: 34.3 g/dL (ref 31.5–35.7)
MCV: 85 fL (ref 79–97)
Monocytes Absolute: 0.5 10*3/uL (ref 0.1–0.9)
Monocytes: 10 %
NEUTROS ABS: 2.9 10*3/uL (ref 1.4–7.0)
Neutrophils Relative %: 57 %
Platelets: 222 10*3/uL (ref 150–379)
RBC: 5.02 x10E6/uL (ref 4.14–5.80)
RDW: 13.2 % (ref 12.3–15.4)
WBC: 5.2 10*3/uL (ref 3.4–10.8)

## 2014-10-30 LAB — BASIC METABOLIC PANEL
BUN/Creatinine Ratio: 12 (ref 10–22)
BUN: 11 mg/dL (ref 8–27)
CALCIUM: 9.2 mg/dL (ref 8.6–10.2)
CO2: 25 mmol/L (ref 18–29)
Chloride: 102 mmol/L (ref 97–108)
Creatinine, Ser: 0.9 mg/dL (ref 0.76–1.27)
GFR calc Af Amer: 104 mL/min/{1.73_m2} (ref >59)
GFR calc non Af Amer: 90 mL/min/{1.73_m2} (ref >59)
GLUCOSE: 94 mg/dL (ref 65–99)
POTASSIUM: 4.3 mmol/L (ref 3.5–5.2)
Sodium: 142 mmol/L (ref 134–144)

## 2014-10-30 LAB — HEPATIC FUNCTION PANEL
ALBUMIN: 4.5 g/dL (ref 3.6–4.8)
ALT: 14 IU/L (ref 0–44)
AST: 16 IU/L (ref 0–40)
Alkaline Phosphatase: 73 IU/L (ref 39–117)
Bilirubin Total: 0.9 mg/dL (ref 0.0–1.2)
Bilirubin, Direct: 0.2 mg/dL (ref 0.00–0.40)
TOTAL PROTEIN: 6.7 g/dL (ref 6.0–8.5)

## 2014-11-19 ENCOUNTER — Encounter: Payer: Self-pay | Admitting: Gastroenterology

## 2015-03-05 ENCOUNTER — Telehealth: Payer: Self-pay | Admitting: Family Medicine

## 2015-03-05 NOTE — Telephone Encounter (Signed)
Pt is requesting a cortisone shot is his shoulder. Pt was wanting it done today however we are booked. Please advise.

## 2015-03-05 NOTE — Telephone Encounter (Signed)
Patient can have a same day appointment next week with Dr. Nicki Reaper. Per Dr. Nicki Reaper

## 2015-03-08 ENCOUNTER — Encounter: Payer: Self-pay | Admitting: Family Medicine

## 2015-03-08 ENCOUNTER — Ambulatory Visit (INDEPENDENT_AMBULATORY_CARE_PROVIDER_SITE_OTHER): Payer: Managed Care, Other (non HMO) | Admitting: Family Medicine

## 2015-03-08 VITALS — BP 132/90 | Ht 68.0 in | Wt 168.0 lb

## 2015-03-08 DIAGNOSIS — S46012A Strain of muscle(s) and tendon(s) of the rotator cuff of left shoulder, initial encounter: Secondary | ICD-10-CM | POA: Diagnosis not present

## 2015-03-08 NOTE — Progress Notes (Signed)
   Subjective:    Patient ID: David Pham, male    DOB: 11-May-1951, 64 y.o.   MRN: [id number]  Shoulder Pain  The pain is present in the left shoulder. This is a new problem. Episode onset: 4 days ago. The treatment provided mild relief.  pt was mowing last Thursday and felt something pop in shoulder. Has had trouble with this shoulder in the past and has injection in it. Taking tylenol, ibuprofen and using icy hot.   Denies any obvious issues other than what happened last Thursday  Review of Systems Patient relates shoulder pain discomfort denies any numbness or tingling.    Objective:   Physical Exam  Patient with difficulty in left shoulder. Some tenderness. No atrophy is noted. Patient is able to put his arm over his head but has difficult time putting arm behind the back difficult time generating strength apprehension test negative but has difficult time raising up on left arm when directly in front of him.  With consent injection given anterior approach care was taken to carefully place in the shoulder joint aspiration done no blood, 1 mL Depo-Medrol, 1-1/2 mL lidocaine.    Assessment & Plan:  I believe this is a rotator cuff strain I doubt that it is a tear but if he is not significantly better over the next few weeks then he needs referral to orthopedist some possible MRI

## 2015-05-12 ENCOUNTER — Ambulatory Visit (INDEPENDENT_AMBULATORY_CARE_PROVIDER_SITE_OTHER): Payer: Managed Care, Other (non HMO) | Admitting: *Deleted

## 2015-05-12 DIAGNOSIS — Z23 Encounter for immunization: Secondary | ICD-10-CM

## 2015-08-13 ENCOUNTER — Encounter: Payer: Self-pay | Admitting: Family Medicine

## 2015-08-13 ENCOUNTER — Ambulatory Visit (INDEPENDENT_AMBULATORY_CARE_PROVIDER_SITE_OTHER): Payer: Managed Care, Other (non HMO) | Admitting: Family Medicine

## 2015-08-13 VITALS — BP 142/96 | Temp 98.0°F | Ht 68.0 in | Wt 166.4 lb

## 2015-08-13 DIAGNOSIS — M533 Sacrococcygeal disorders, not elsewhere classified: Secondary | ICD-10-CM

## 2015-08-13 MED ORDER — MELOXICAM 15 MG PO TABS: 15.0000 mg | ORAL_TABLET | Freq: Every day | ORAL | Status: DC

## 2015-08-13 NOTE — Patient Instructions (Signed)
If not better within 2 weeks please follow up   meloxicam one daily for the next 3 days

## 2015-08-13 NOTE — Progress Notes (Signed)
   Subjective:    Patient ID: David Pham, male    DOB: 02/05/1951, 65 y.o.   MRN: [id number]  Back Pain This is a new problem. The current episode started 1 to 4 weeks ago. The pain is present in the gluteal and lumbar spine. Quality: Dull pain. Treatments tried: Tylenol.   he relates pain in the lower sacrum area radiates to the tailbone region worse when he is sitting not as bad when he is standing relates no pain down the legs denies rectal bleeding denies hematuria denies fever chills sweats denies constipation  Patient states no other concerns this visit.  Review of Systems  Musculoskeletal: Positive for back pain.   See above    Objective:   Physical Exam  Constitutional: He appears well-nourished. No distress.  Cardiovascular: Normal rate, regular rhythm and normal heart sounds.   No murmur heard. Pulmonary/Chest: Effort normal and breath sounds normal. No respiratory distress.  Musculoskeletal: He exhibits no edema.  Lymphadenopathy:    He has no cervical adenopathy.  Neurological: He is alert.  Psychiatric: His behavior is normal.  Vitals reviewed.  prostate not present rectal exam normal no masses no growth negative straight leg raise  It is possibility that patient is having some coccygeal pain related to sitting he has slight tenderness of the coccyx we will treat with anti-inflammatory    Assessment & Plan:  Patient with interesting issue of low back pain with some radiation into the rectal area denies bleeding denies weight loss he has had prostatectomy in his PSA stays undetectable I find no evidence of any type of cancer currently. I recommend x-rays of the lumbar spine and the coccyx. In addition to this we will try anti-inflammatory on a regular basis patient will follow-up in a few weeks for a wellness exam if he is having ongoing trouble may need referral

## 2015-08-16 ENCOUNTER — Ambulatory Visit (HOSPITAL_COMMUNITY)
Admission: RE | Admit: 2015-08-16 | Discharge: 2015-08-16 | Disposition: A | Discharge status: Home / Self Care | Payer: Managed Care, Other (non HMO) | Source: Ambulatory Visit | Attending: Family Medicine | Admitting: Family Medicine

## 2015-08-16 DIAGNOSIS — M4186 Other forms of scoliosis, lumbar region: Secondary | ICD-10-CM | POA: Insufficient documentation

## 2015-08-16 DIAGNOSIS — M47897 Other spondylosis, lumbosacral region: Secondary | ICD-10-CM | POA: Insufficient documentation

## 2015-08-16 DIAGNOSIS — M533 Sacrococcygeal disorders, not elsewhere classified: Secondary | ICD-10-CM | POA: Diagnosis present

## 2015-08-16 DIAGNOSIS — M545 Low back pain: Secondary | ICD-10-CM | POA: Insufficient documentation

## 2015-08-16 DIAGNOSIS — M47896 Other spondylosis, lumbar region: Secondary | ICD-10-CM | POA: Diagnosis not present

## 2015-08-16 DIAGNOSIS — M5136 Other intervertebral disc degeneration, lumbar region: Secondary | ICD-10-CM | POA: Diagnosis not present

## 2015-09-23 ENCOUNTER — Encounter: Payer: Self-pay | Admitting: Family Medicine

## 2015-09-23 ENCOUNTER — Ambulatory Visit (INDEPENDENT_AMBULATORY_CARE_PROVIDER_SITE_OTHER): Payer: Managed Care, Other (non HMO) | Admitting: Family Medicine

## 2015-09-23 VITALS — BP 132/86 | Ht 68.0 in | Wt 164.0 lb

## 2015-09-23 DIAGNOSIS — M545 Low back pain: Secondary | ICD-10-CM | POA: Diagnosis not present

## 2015-09-23 DIAGNOSIS — Z79899 Other long term (current) drug therapy: Secondary | ICD-10-CM

## 2015-09-23 DIAGNOSIS — R5383 Other fatigue: Secondary | ICD-10-CM | POA: Diagnosis not present

## 2015-09-23 DIAGNOSIS — Z13220 Encounter for screening for lipoid disorders: Secondary | ICD-10-CM | POA: Diagnosis not present

## 2015-09-23 DIAGNOSIS — M4317 Spondylolisthesis, lumbosacral region: Secondary | ICD-10-CM

## 2015-09-23 MED ORDER — MELOXICAM 15 MG PO TABS: 15.0000 mg | ORAL_TABLET | Freq: Every day | ORAL | Status: DC

## 2015-09-23 MED ORDER — OMEPRAZOLE 20 MG PO CPDR: 20.0000 mg | DELAYED_RELEASE_CAPSULE | Freq: Every day | ORAL | Status: DC

## 2015-09-23 NOTE — Progress Notes (Signed)
   Subjective:    Patient ID: David Pham, male    DOB: 11/19/1950, 65 y.o.   MRN: [id number]  HPI Patient playing some golf intermittent low back pain with certain movements. Also was swinging the club cause some pain no radiation down the leg denied any other particular troubles. Does not wake him up at night no affect on urination or bowel movements denies any rectal bleeding denies fever chills. Up-to-date on colonoscopy. Patient arrives with c/o returning back pain after playing golf. Patient has a hx of back pain that flares at times.  Review of Systems See above    Objective:   Physical Exam Lungs clear heart regular blood pressure good Low back some mild tenderness. Lumbar region.       Assessment & Plan:  Low back pain-x-ray shows spondylolisthesis. Patient relates pain was worse with golf with the twisting he denies any pain waking him up at night no rectal bleeding.  Screening lab work and Hemoccult cards 3 for a wellness exam in early April

## 2015-09-23 NOTE — Patient Instructions (Signed)
DASH Eating Plan  DASH stands for "Dietary Approaches to Stop Hypertension." The DASH eating plan is a healthy eating plan that has been shown to reduce high blood pressure (hypertension). Additional health benefits may include reducing the risk of type 2 diabetes mellitus, heart disease, and stroke. The DASH eating plan may also help with weight loss.  WHAT DO I NEED TO KNOW ABOUT THE DASH EATING PLAN?  For the DASH eating plan, you will follow these general guidelines:  · Choose foods with a percent daily value for sodium of less than 5% (as listed on the food label).  · Use salt-free seasonings or herbs instead of table salt or sea salt.  · Check with your health care provider or pharmacist before using salt substitutes.  · Eat lower-sodium products, often labeled as "lower sodium" or "no salt added."  · Eat fresh foods.  · Eat more vegetables, fruits, and low-fat dairy products.  · Choose whole grains. Look for the word "whole" as the first word in the ingredient list.  · Choose fish and skinless chicken or turkey more often than red meat. Limit fish, poultry, and meat to 6 oz (170 g) each day.  · Limit sweets, desserts, sugars, and sugary drinks.  · Choose heart-healthy fats.  · Limit cheese to 1 oz (28 g) per day.  · Eat more home-cooked food and less restaurant, buffet, and fast food.  · Limit fried foods.  · Cook foods using methods other than frying.  · Limit canned vegetables. If you do use them, rinse them well to decrease the sodium.  · When eating at a restaurant, ask that your food be prepared with less salt, or no salt if possible.  WHAT FOODS CAN I EAT?  Seek help from a dietitian for individual calorie needs.  Grains  Whole grain or whole wheat bread. Brown rice. Whole grain or whole wheat pasta. Quinoa, bulgur, and whole grain cereals. Low-sodium cereals. Corn or whole wheat flour tortillas. Whole grain cornbread. Whole grain crackers. Low-sodium crackers.  Vegetables  Fresh or frozen vegetables  (raw, steamed, roasted, or grilled). Low-sodium or reduced-sodium tomato and vegetable juices. Low-sodium or reduced-sodium tomato sauce and paste. Low-sodium or reduced-sodium canned vegetables.   Fruits  All fresh, canned (in natural juice), or frozen fruits.  Meat and Other Protein Products  Ground beef (85% or leaner), grass-fed beef, or beef trimmed of fat. Skinless chicken or turkey. Ground chicken or turkey. Pork trimmed of fat. All fish and seafood. Eggs. Dried beans, peas, or lentils. Unsalted nuts and seeds. Unsalted canned beans.  Dairy  Low-fat dairy products, such as skim or 1% milk, 2% or reduced-fat cheeses, low-fat ricotta or cottage cheese, or plain low-fat yogurt. Low-sodium or reduced-sodium cheeses.  Fats and Oils  Tub margarines without trans fats. Light or reduced-fat mayonnaise and salad dressings (reduced sodium). Avocado. Safflower, olive, or canola oils. Natural peanut or almond butter.  Other  Unsalted popcorn and pretzels.  The items listed above may not be a complete list of recommended foods or beverages. Contact your dietitian for more options.  WHAT FOODS ARE NOT RECOMMENDED?  Grains  White bread. White pasta. White rice. Refined cornbread. Bagels and croissants. Crackers that contain trans fat.  Vegetables  Creamed or fried vegetables. Vegetables in a cheese sauce. Regular canned vegetables. Regular canned tomato sauce and paste. Regular tomato and vegetable juices.  Fruits  Dried fruits. Canned fruit in light or heavy syrup. Fruit juice.  Meat and Other Protein   Products  Fatty cuts of meat. Ribs, chicken wings, bacon, sausage, bologna, salami, chitterlings, fatback, hot dogs, bratwurst, and packaged luncheon meats. Salted nuts and seeds. Canned beans with salt.  Dairy  Whole or 2% milk, cream, half-and-half, and cream cheese. Whole-fat or sweetened yogurt. Full-fat cheeses or blue cheese. Nondairy creamers and whipped toppings. Processed cheese, cheese spreads, or cheese  curds.  Condiments  Onion and garlic salt, seasoned salt, table salt, and sea salt. Canned and packaged gravies. Worcestershire sauce. Tartar sauce. Barbecue sauce. Teriyaki sauce. Soy sauce, including reduced sodium. Steak sauce. Fish sauce. Oyster sauce. Cocktail sauce. Horseradish. Ketchup and mustard. Meat flavorings and tenderizers. Bouillon cubes. Hot sauce. Tabasco sauce. Marinades. Taco seasonings. Relishes.  Fats and Oils  Butter, stick margarine, lard, shortening, ghee, and bacon fat. Coconut, palm kernel, or palm oils. Regular salad dressings.  Other  Pickles and olives. Salted popcorn and pretzels.  The items listed above may not be a complete list of foods and beverages to avoid. Contact your dietitian for more information.  WHERE CAN I FIND MORE INFORMATION?  National Heart, [employer], [employer] Institute: www.nhlbi.nih.gov/health/health-topics/topics/dash/     This information is not intended to replace advice given to you by your health care provider. Make sure you discuss any questions you have with your health care provider.     Document Released: 06/29/2011 Document Revised: 07/31/2014 Document Reviewed: 05/14/2013  Elsevier Interactive Patient Education ©2016 Else[employer] Inc[employer]

## 2015-10-24 LAB — BASIC METABOLIC PANEL
BUN/Creatinine Ratio: 10 (ref 10–22)
BUN: 9 mg/dL (ref 8–27)
CALCIUM: 8.9 mg/dL (ref 8.6–10.2)
CHLORIDE: 103 mmol/L (ref 96–106)
CO2: 25 mmol/L (ref 18–29)
Creatinine, Ser: 0.86 mg/dL (ref 0.76–1.27)
GFR calc Af Amer: 105 mL/min/{1.73_m2} (ref >59)
GFR calc non Af Amer: 91 mL/min/{1.73_m2} (ref >59)
GLUCOSE: 93 mg/dL (ref 65–99)
Potassium: 4.3 mmol/L (ref 3.5–5.2)
Sodium: 141 mmol/L (ref 134–144)

## 2015-10-24 LAB — HEPATIC FUNCTION PANEL
ALT: 13 IU/L (ref 0–44)
AST: 20 IU/L (ref 0–40)
Albumin: 4 g/dL (ref 3.6–4.8)
Alkaline Phosphatase: 62 IU/L (ref 39–117)
Bilirubin Total: 0.9 mg/dL (ref 0.0–1.2)
Bilirubin, Direct: 0.22 mg/dL (ref 0.00–0.40)
Total Protein: 6.3 g/dL (ref 6.0–8.5)

## 2015-10-24 LAB — CBC WITH DIFFERENTIAL/PLATELET
BASOS: 0 %
Basophils Absolute: 0 10*3/uL (ref 0.0–0.2)
EOS (ABSOLUTE): 0.1 10*3/uL (ref 0.0–0.4)
EOS: 1 %
HEMATOCRIT: 42 % (ref 37.5–51.0)
HEMOGLOBIN: 14.1 g/dL (ref 12.6–17.7)
IMMATURE GRANULOCYTES: 0 %
Immature Grans (Abs): 0 10*3/uL (ref 0.0–0.1)
LYMPHS ABS: 2 10*3/uL (ref 0.7–3.1)
Lymphs: 41 %
MCH: 29.7 pg (ref 26.6–33.0)
MCHC: 33.6 g/dL (ref 31.5–35.7)
MCV: 89 fL (ref 79–97)
MONOCYTES: 10 %
Monocytes Absolute: 0.5 10*3/uL (ref 0.1–0.9)
Neutrophils Absolute: 2.3 10*3/uL (ref 1.4–7.0)
Neutrophils: 48 %
Platelets: 194 10*3/uL (ref 150–379)
RBC: 4.74 x10E6/uL (ref 4.14–5.80)
RDW: 14.1 % (ref 12.3–15.4)
WBC: 4.9 10*3/uL (ref 3.4–10.8)

## 2015-10-24 LAB — LIPID PANEL
CHOL/HDL RATIO: 3.6 ratio (ref 0.0–5.0)
Cholesterol, Total: 163 mg/dL (ref 100–199)
HDL: 45 mg/dL (ref >39)
LDL CALC: 103 mg/dL — AB (ref 0–99)
TRIGLYCERIDES: 75 mg/dL (ref 0–149)
VLDL Cholesterol Cal: 15 mg/dL (ref 5–40)

## 2015-10-24 LAB — TSH: TSH: 1.69 u[IU]/mL (ref 0.450–4.500)

## 2015-11-01 ENCOUNTER — Ambulatory Visit (INDEPENDENT_AMBULATORY_CARE_PROVIDER_SITE_OTHER): Payer: Managed Care, Other (non HMO) | Admitting: Family Medicine

## 2015-11-01 ENCOUNTER — Encounter: Payer: Self-pay | Admitting: Family Medicine

## 2015-11-01 VITALS — BP 134/80 | Ht 68.0 in | Wt 165.1 lb

## 2015-11-01 DIAGNOSIS — Z23 Encounter for immunization: Secondary | ICD-10-CM | POA: Diagnosis not present

## 2015-11-01 DIAGNOSIS — Z0000 Encounter for general adult medical examination without abnormal findings: Secondary | ICD-10-CM | POA: Diagnosis not present

## 2015-11-01 MED ORDER — PNEUMOCOCCAL 13-VAL CONJ VACC IM SUSP: 0.5000 mL | Freq: Once | INTRAMUSCULAR | Status: DC

## 2015-11-01 NOTE — Progress Notes (Signed)
   Subjective:    Patient ID: David Pham, male    DOB: 08-20-1950, 65 y.o.   MRN: [id number]  HPI AWV- Annual Wellness Visit  The patient was seen for their annual wellness visit. The patient's past medical history, surgical history, and family history were reviewed. Pertinent vaccines were reviewed ( tetanus, pneumonia, shingles, flu) The patient's medication list was reviewed and updated.  The height and weight were entered. The patient's current BMI is:25  Cognitive screening was completed. Outcome of Mini - Cog: Pass  Falls within the past 6 months:None   Current tobacco usage: Patient states uses dip sparingly. (All patients who use tobacco were given written and verbal information on quitting)  Recent listing of emergency department/hospitalizations over the past year were reviewed.  current specialist the patient sees on a regular basis: Patient states sees urologist once a year for prostate. See Chiropractor once a week.   Medicare annual wellness visit patient questionnaire was reviewed.  A written screening schedule for the patient for the next 5-10 years was given. Appropriate discussion of followup regarding next visit was discussed.  Patient has concerns of back pain. Urology- PSA less than 0.001 Recent labs drawn on 10/23/15 Dietary exercise all discussed. Safety discussed as well Lab work discussed in detail overall looks good Review of Systems  Constitutional: Negative for fever, activity change and appetite change.  HENT: Negative for congestion and rhinorrhea.   Eyes: Negative for discharge.  Respiratory: Negative for cough and wheezing.   Cardiovascular: Negative for chest pain.  Gastrointestinal: Negative for vomiting, abdominal pain and blood in stool.  Genitourinary: Negative for frequency and difficulty urinating.  Musculoskeletal: Negative for neck pain.  Skin: Negative for rash.  Allergic/Immunologic: Negative for environmental allergies and  food allergies.  Neurological: Negative for weakness and headaches.  Psychiatric/Behavioral: Negative for agitation.       Objective:   Physical Exam  Constitutional: He appears well-developed and well-nourished.  HENT:  Head: Normocephalic and atraumatic.  Right Ear: External ear normal.  Left Ear: External ear normal.  Nose: Nose normal.  Mouth/Throat: Oropharynx is clear and moist.  Eyes: EOM are normal. Pupils are equal, round, and reactive to light.  Neck: Normal range of motion. Neck supple. No thyromegaly present.  Cardiovascular: Normal rate, regular rhythm and normal heart sounds.   No murmur heard. Pulmonary/Chest: Effort normal and breath sounds normal. No respiratory distress. He has no wheezes.  Abdominal: Soft. Bowel sounds are normal. He exhibits no distension and no mass. There is no tenderness.  Genitourinary: Prostate normal and penis normal.  Musculoskeletal: Normal range of motion. He exhibits no edema.  Lymphadenopathy:    He has no cervical adenopathy.  Neurological: He is alert. He exhibits normal muscle tone.  Skin: Skin is warm and dry. No erythema.  Psychiatric: He has a normal mood and affect. His behavior is normal. Judgment normal.          Assessment & Plan:  Annual wellness exam up-to-date on colonoscopy. PSA followed by urologist. Healthy exercise watching diet discussed in detail. Patient sees chiropractor for his back pain which is perfectly fine. Stretching exercises discussed. Patient doing well on omeprazole will try every other day. Recommend follow-up within 6 months sooner if any problems

## 2015-11-01 NOTE — Patient Instructions (Addendum)
Regular walking  Watch diet  Recheck in 6 months    Aspirin and Your Heart  Aspirin is a medicine that affects the way blood clots. Aspirin can be used to help reduce the risk of blood clots, heart attacks, and other heart-related problems.  SHOULD I TAKE ASPIRIN? Your health care provider will help you determine whether it is safe and beneficial for you to take aspirin daily. Taking aspirin daily may be beneficial if you:  Have had a heart attack or chest pain.  Have undergone open heart surgery such as coronary artery bypass surgery (CABG).  Have had coronary angioplasty.  Have experienced a stroke or transient ischemic attack (TIA).  Have peripheral vascular disease (PVD).  Have chronic heart rhythm problems such as atrial fibrillation. ARE THERE ANY RISKS OF TAKING ASPIRIN DAILY? Daily use of aspirin can increase your risk of side effects. Some of these include:  Bleeding. Bleeding problems can be minor or serious. An example of a minor problem is a cut that does not stop bleeding. An example of a more serious problem is stomach bleeding or bleeding into the brain. Your risk of bleeding is increased if you are also taking non-steroidal anti-inflammatory medicine (NSAIDs).  Increased bruising.  Upset stomach.  An allergic reaction. People who have nasal polyps have an increased risk of developing an aspirin allergy. WHAT ARE SOME GUIDELINES I SHOULD FOLLOW WHEN TAKING ASPIRIN?   Take aspirin only as directed by your health care provider. Make sure you understand how much you should take and what form you should take. The two forms of aspirin are:  Non-enteric-coated. This type of aspirin does not have a coating and is absorbed quickly. Non-enteric-coated aspirin is usually recommended for people with chest pain. This type of aspirin also comes in a chewable form.  Enteric-coated. This type of aspirin has a special coating that releases the medicine very slowly.  Enteric-coated aspirin causes less stomach upset than non-enteric-coated aspirin. This type of aspirin should not be chewed or crushed.  Drink alcohol in moderation. Drinking alcohol increases your risk of bleeding. WHEN SHOULD I SEEK MEDICAL CARE?   You have unusual bleeding or bruising.  You have stomach pain.  You have an allergic reaction. Symptoms of an allergic reaction include:  Hives.  Itchy skin.  Swelling of the lips, tongue, or face.  You have ringing in your ears. WHEN SHOULD I SEEK IMMEDIATE MEDICAL CARE?   Your bowel movements are bloody, dark red, or black in color.  You vomit or cough up blood.  You have blood in your urine.  You cough, wheeze, or feel short of breath. If you have any of the following symptoms, this is an emergency. Do not wait to see if the pain will go away. Get medical help at once. Call your local emergency services (911 in the [place] Do not drive yourself to the hospital.  You have severe chest pain, especially if the pain is crushing or pressure-like and spreads to the arms, back, neck, or jaw.  You have stroke-like symptoms, such as:   Loss of vision.   Difficulty talking.   Numbness or weakness on one side of your body.   Numbness or weakness in your arm or leg.   Not thinking clearly or feeling confused.    This information is not intended to replace advice given to you by your health care provider. Make sure you discuss any questions you have with your health care provider.   Document Released:  06/22/2008 Document Revised: 07/31/2014 Document Reviewed: 10/15/2013 Elsevier Interactive Patient Education [employer].

## 2015-11-28 ENCOUNTER — Telehealth: Payer: Self-pay | Admitting: Family Medicine

## 2015-11-28 NOTE — Telephone Encounter (Signed)
Please let the patient know that I received a letter from his chiropractor Dr. Lloyd Huger to this letter the patient is not getting much relief from meloxicam. If he would like to try different anti-inflammatory I would recommend prescription strength naproxen 500 mg, 1 twice a day, #60, 4 refills. I also recommend stopping meloxicam at that point. If the medication bothers her patients stomach he needs to let us know. I also recommend if medication is not significantly assisting with this issue the next step would be referral to orthopedic back specialist to see if injections or other treatments would be beneficial. Thank you

## 2015-11-29 NOTE — Telephone Encounter (Signed)
Left message to return call 

## 2015-12-03 NOTE — Telephone Encounter (Signed)
Discussed with patient. Patient advised that Dr Nicki Reaper received a letter from his chiropractor Dr. Dabbs-according to this letter the patient is not getting much relief from meloxicam. If he would like to try different anti-inflammatory Dr Nicki Reaper would recommend prescription strength naproxen 500 mg, 1 twice a day, #60, 4 refills. Dr Nicki Reaper also recommends stopping meloxicam at that point. If the medication bothers her patients stomach he needs to let us know. Dr Nicki Reaper also recommends if medication is not significantly assisting with this issue the next step would be referral to orthopedic back specialist to see if injections or other treatments would be beneficial. Patient verbalized understanding and stated her just filled and restarted the mobic and wants to stick with that for now. Patient states he will call back if continued problems to get referral to orthopedic.

## 2015-12-16 ENCOUNTER — Encounter: Payer: Self-pay | Admitting: Family Medicine

## 2016-03-30 ENCOUNTER — Ambulatory Visit (INDEPENDENT_AMBULATORY_CARE_PROVIDER_SITE_OTHER): Payer: PPO | Admitting: Family Medicine

## 2016-03-30 ENCOUNTER — Encounter: Payer: Self-pay | Admitting: Family Medicine

## 2016-03-30 VITALS — BP 140/86 | Ht 68.0 in | Wt 166.0 lb

## 2016-03-30 DIAGNOSIS — M7582 Other shoulder lesions, left shoulder: Secondary | ICD-10-CM

## 2016-03-30 DIAGNOSIS — M778 Other enthesopathies, not elsewhere classified: Secondary | ICD-10-CM

## 2016-03-30 MED ORDER — NABUMETONE 500 MG PO TABS: 500.0000 mg | ORAL_TABLET | Freq: Every day | ORAL | 0 refills | Status: DC

## 2016-03-30 NOTE — Progress Notes (Signed)
   Subjective:    Patient ID: David Pham, male    DOB: 1950/10/21, 65 y.o.   MRN: [id number]  HPI Patient arrives with c/o left shoulder pain for a few days. Has had this also flare up in the past. This patient states he's been doing a lot of yard work. Using clippers a lot. Because of this he is having a lot of pain in his left shoulder radiates into the left mid deltoid region does not radiate down the arm denies a numbness or weakness in the arm no pain in the neck. No chest pain or tightness or shortness of breath. No nausea vomiting or diarrhea. No fevers. No prior history of this. Dr Cay Schillings-  Working with his back  Review of Systems    see above. Objective:   Physical Exam  Lungs are clear hearts regular pulse normal neck no masses there is some tenderness in the posterior upper left scapular region as well as left mid deltoid region. Reflexes good strength good. Range of motion fairly good. More than likely tendinitis and shoulder find no evidence of rotator cuff injury      Assessment & Plan:  Tendinitis, anti-inflammatory next couple weeks, and range of motion exercises sheet given as well as shown to the patient follow-up if progressive troubles follow-up of problems

## 2016-05-02 ENCOUNTER — Encounter: Payer: Self-pay | Admitting: Family Medicine

## 2016-05-02 ENCOUNTER — Ambulatory Visit (INDEPENDENT_AMBULATORY_CARE_PROVIDER_SITE_OTHER): Payer: PPO | Admitting: Family Medicine

## 2016-05-02 VITALS — BP 126/84 | Temp 98.1°F | Ht 68.0 in | Wt 166.0 lb

## 2016-05-02 DIAGNOSIS — Z23 Encounter for immunization: Secondary | ICD-10-CM

## 2016-05-02 DIAGNOSIS — K219 Gastro-esophageal reflux disease without esophagitis: Secondary | ICD-10-CM | POA: Diagnosis not present

## 2016-05-02 DIAGNOSIS — J301 Allergic rhinitis due to pollen: Secondary | ICD-10-CM

## 2016-05-02 MED ORDER — RANITIDINE HCL 300 MG PO TABS: 300.0000 mg | ORAL_TABLET | Freq: Every day | ORAL | 3 refills | Status: DC

## 2016-05-02 MED ORDER — FLUTICASONE PROPIONATE 50 MCG/ACT NA SUSP: 2.0000 | Freq: Every day | NASAL | 5 refills | Status: DC

## 2016-05-02 NOTE — Progress Notes (Signed)
   Subjective:    Patient ID: David Pham, male    DOB: 23-Feb-1951, 65 y.o.   MRN: [id number]  Sinusitis  This is a new problem. Episode onset: 2 weeks. Associated symptoms include congestion, coughing, headaches and sinus pressure. Pertinent negatives include no ear pain. Treatments tried: saline spray, loratadine.  Patient with significant head congestion drainage coughing sneezing drainage does not have any discoloration denies fever chills sweats. Wants rx for prescription nasal spray.   Needs updated refills on omeprazole. Wants a 90 day supply. Doing well on med.   Wants flu vaccine today.   Pt states BP has been running high on his last few visits. Pt has been walking a mile a day.   Pt wants to know if he should be taking asa 81mg .    Review of Systems  Constitutional: Negative for activity change and fever.  HENT: Positive for congestion, rhinorrhea and sinus pressure. Negative for ear pain.   Eyes: Negative for discharge.  Respiratory: Positive for cough. Negative for wheezing.   Cardiovascular: Negative for chest pain.  Neurological: Positive for headaches.       Objective:   Physical Exam  Constitutional: He appears well-developed.  HENT:  Head: Normocephalic.  Mouth/Throat: Oropharynx is clear and moist. No oropharyngeal exudate.  Neck: Normal range of motion.  Cardiovascular: Normal rate, regular rhythm and normal heart sounds.   No murmur heard. Pulmonary/Chest: Effort normal and breath sounds normal. He has no wheezes.  Lymphadenopathy:    He has no cervical adenopathy.  Neurological: He exhibits normal muscle tone.  Skin: Skin is warm and dry.  Nursing note and vitals reviewed.         Assessment & Plan:  Sinus related symptoms due to allergies medications recommended no antibiotics indicated if ongoing trouble notify us  Reflux under very good control stop omeprazole use Zantac instead follow-up in the spring for comprehensive checkup healthy  eating regular physical activity recommended

## 2016-07-28 ENCOUNTER — Telehealth: Payer: Self-pay | Admitting: Family Medicine

## 2016-08-01 NOTE — Telephone Encounter (Signed)
Returned patient's call - left voice mail.

## 2016-08-01 NOTE — Telephone Encounter (Signed)
Patient called back and scheduled for 08/21/16.

## 2016-08-03 ENCOUNTER — Ambulatory Visit (INDEPENDENT_AMBULATORY_CARE_PROVIDER_SITE_OTHER): Payer: Medicare HMO | Admitting: Family Medicine

## 2016-08-03 ENCOUNTER — Encounter: Payer: Self-pay | Admitting: Family Medicine

## 2016-08-03 VITALS — BP 148/96 | Ht 68.0 in | Wt 164.6 lb

## 2016-08-03 DIAGNOSIS — N529 Male erectile dysfunction, unspecified: Secondary | ICD-10-CM | POA: Diagnosis not present

## 2016-08-03 DIAGNOSIS — R030 Elevated blood-pressure reading, without diagnosis of hypertension: Secondary | ICD-10-CM

## 2016-08-03 DIAGNOSIS — Z8546 Personal history of malignant neoplasm of prostate: Secondary | ICD-10-CM | POA: Diagnosis not present

## 2016-08-03 NOTE — Patient Instructions (Addendum)
DASH Eating Plan DASH stands for "Dietary Approaches to Stop Hypertension." The DASH eating plan is a healthy eating plan that has been shown to reduce high blood pressure (hypertension). Additional health benefits may include reducing the risk of type 2 diabetes mellitus, heart disease, and stroke. The DASH eating plan may also help with weight loss. What do I need to know about the DASH eating plan? For the DASH eating plan, you will follow these general guidelines:  Choose foods with less than 150 milligrams of sodium per serving (as listed on the food label).  Use salt-free seasonings or herbs instead of table salt or sea salt.  Check with your health care provider or pharmacist before using salt substitutes.  Eat lower-sodium products. These are often labeled as "low-sodium" or "no salt added."  Eat fresh foods. Avoid eating a lot of canned foods.  Eat more vegetables, fruits, and low-fat dairy products.  Choose whole grains. Look for the word "whole" as the first word in the ingredient list.  Choose fish and skinless chicken or turkey more often than red meat. Limit fish, poultry, and meat to 6 oz (170 g) each day.  Limit sweets, desserts, sugars, and sugary drinks.  Choose heart-healthy fats.  Eat more home-cooked food and less restaurant, buffet, and fast food.  Limit fried foods.  Do not fry foods. Cook foods using methods such as baking, boiling, grilling, and broiling instead.  When eating at a restaurant, ask that your food be prepared with less salt, or no salt if possible. What foods can I eat? Seek help from a dietitian for individual calorie needs. Grains  Whole grain or whole wheat bread. Brown rice. Whole grain or whole wheat pasta. Quinoa, bulgur, and whole grain cereals. Low-sodium cereals. Corn or whole wheat flour tortillas. Whole grain cornbread. Whole grain crackers. Low-sodium crackers. Vegetables  Fresh or frozen vegetables (raw, steamed, roasted, or  grilled). Low-sodium or reduced-sodium tomato and vegetable juices. Low-sodium or reduced-sodium tomato sauce and paste. Low-sodium or reduced-sodium canned vegetables. Fruits  All fresh, canned (in natural juice), or frozen fruits. Meat and Other Protein Products  Ground beef (85% or leaner), grass-fed beef, or beef trimmed of fat. Skinless chicken or turkey. Ground chicken or turkey. Pork trimmed of fat. All fish and seafood. Eggs. Dried beans, peas, or lentils. Unsalted nuts and seeds. Unsalted canned beans. Dairy  Low-fat dairy products, such as skim or 1% milk, 2% or reduced-fat cheeses, low-fat ricotta or cottage cheese, or plain low-fat yogurt. Low-sodium or reduced-sodium cheeses. Fats and Oils  Tub margarines without trans fats. Light or reduced-fat mayonnaise and salad dressings (reduced sodium). Avocado. Safflower, olive, or canola oils. Natural peanut or almond butter. Other  Unsalted popcorn and pretzels. The items listed above may not be a complete list of recommended foods or beverages. Contact your dietitian for more options.  What foods are not recommended? Grains  White bread. White pasta. White rice. Refined cornbread. Bagels and croissants. Crackers that contain trans fat. Vegetables  Creamed or fried vegetables. Vegetables in a cheese sauce. Regular canned vegetables. Regular canned tomato sauce and paste. Regular tomato and vegetable juices. Fruits  Canned fruit in light or heavy syrup. Fruit juice. Meat and Other Protein Products  Fatty cuts of meat. Ribs, chicken wings, bacon, sausage, bologna, salami, chitterlings, fatback, hot dogs, bratwurst, and packaged luncheon meats. Salted nuts and seeds. Canned beans with salt. Dairy  Whole or 2% milk, cream, half-and-half, and cream cheese. Whole-fat or sweetened yogurt. Full-fat cheeses   or blue cheese. Nondairy creamers and whipped toppings. Processed cheese, cheese spreads, or cheese curds. Condiments  Onion and garlic  salt, seasoned salt, table salt, and sea salt. Canned and packaged gravies. Worcestershire sauce. Tartar sauce. Barbecue sauce. Teriyaki sauce. Soy sauce, including reduced sodium. Steak sauce. Fish sauce. Oyster sauce. Cocktail sauce. Horseradish. Ketchup and mustard. Meat flavorings and tenderizers. Bouillon cubes. Hot sauce. Tabasco sauce. Marinades. Taco seasonings. Relishes. Fats and Oils  Butter, stick margarine, lard, shortening, ghee, and bacon fat. Coconut, palm kernel, or palm oils. Regular salad dressings. Other  Pickles and olives. Salted popcorn and pretzels. The items listed above may not be a complete list of foods and beverages to avoid. Contact your dietitian for more information.  Where can I find more information? National Heart, Lung, and Blood Institute: travelstabloid.com This information is not intended to replace advice given to you by your health care provider. Make sure you discuss any questions you have with your health care provider. Document Released: 06/29/2011 Document Revised: 12/16/2015 Document Reviewed: 05/14/2013 Elsevier Interactive Patient Education  2017 [place]  May use Tylenol Avoid anti inflammatories

## 2016-08-03 NOTE — Progress Notes (Signed)
   Subjective:    Patient ID: David Pham, male    DOB: 1950/10/02, 66 y.o.   MRN: [id number]  HPI  Patient arrives with c/o elevated blood pressure at specialist office today. Patient also with sore neck for a week. Patient was at specialist office had blood pressure taken was high the specialist told him is very dangerous patient not having any chest pressure tightness or pain no headache no nausea or vomiting no fever chills or sweats. Has not had any problem with blood pressure before in the past does have strong family history of blood pressure. Patient does not smoke. He tries to eat somewhat healthy. Patient states he got very nervous after he was told his blood pressure was up. Review of Systems Please see above.    Objective:   Physical Exam  Lungs are clear there is no crackles heart is regular pulse normal extremities no edema skin warm dry neurologic grossly normal blood pressure is moderately elevated in both arms      Assessment & Plan:  Long discussion held regarding elevated blood pressure in the diagnosis of hypertension ideally it would be on 2 separate visits since he is asymptomatic I recommend that he does a low-salt diet walking gently for exercise plus a follow-up office visit next week if persistent elevated blood pressure then will need to go on medications  This patient relates a lot of anxiety relating to the elevated blood pressure. He is worried if it might cause any problems we discussed the possibility of starting medications but then he was even more worried that the medication would cause problems. After long discussion patient agreed that it would be reasonable to recheck him again next week for him to follow a low-salt diet in to stay mildly active he will follow-up sooner problems

## 2016-08-09 ENCOUNTER — Ambulatory Visit: Payer: Medicare HMO | Admitting: Family Medicine

## 2016-08-15 ENCOUNTER — Encounter: Payer: Self-pay | Admitting: Family Medicine

## 2016-08-15 ENCOUNTER — Ambulatory Visit (INDEPENDENT_AMBULATORY_CARE_PROVIDER_SITE_OTHER): Payer: Medicare HMO | Admitting: Family Medicine

## 2016-08-15 VITALS — BP 142/96 | Ht 68.0 in | Wt 160.2 lb

## 2016-08-15 DIAGNOSIS — I10 Essential (primary) hypertension: Secondary | ICD-10-CM | POA: Diagnosis not present

## 2016-08-15 MED ORDER — AMLODIPINE BESYLATE 2.5 MG PO TABS: 2.5000 mg | ORAL_TABLET | Freq: Every day | ORAL | 3 refills | Status: DC

## 2016-08-15 NOTE — Progress Notes (Signed)
   Subjective:    Patient ID: David Pham, male    DOB: [birth year], 66 y.o.   MRN: [id number]  HPI Patient arrives for a follow up on elevated blood pressure. Patient presents follow-up regarding elevated blood pressure he gone to a specialist office diagnosed with a very high blood pressure it concerned him sit therefore he immediately was sent to our office we checked it wasn't quite as high but still elevated we talked about lifestyle changes in activities been doing that to some degree denies any chest tightness pressure pain shortness breath  Review of Systems  Constitutional: Negative for activity change, fatigue and fever.  Respiratory: Negative for cough and shortness of breath.   Cardiovascular: Negative for chest pain and leg swelling.  Neurological: Negative for headaches.       Objective:   Physical Exam  Constitutional: He appears well-nourished. No distress.  Cardiovascular: Normal rate, regular rhythm and normal heart sounds.   No murmur heard. Pulmonary/Chest: Effort normal and breath sounds normal. No respiratory distress.  Musculoskeletal: He exhibits no edema.  Lymphadenopathy:    He has no cervical adenopathy.  Neurological: He is alert.  Psychiatric: His behavior is normal.  Vitals reviewed.    EKG has a wavering baseline but overall appears to be good     Assessment & Plan:  Chest muscle and upper back soreness stretching exercises recommended  HTN subpar control start amlodipine 2.5 mg daily patient nervous about any medicines but also very nervous about elevated blood pressure we had a long discussion he is willing to try the medication he will notify us if any problems follow-up within 4-6 weeks

## 2016-08-21 ENCOUNTER — Encounter: Payer: Self-pay | Admitting: Family Medicine

## 2016-08-22 ENCOUNTER — Encounter: Payer: Self-pay | Admitting: Family Medicine

## 2016-08-23 DIAGNOSIS — R69 Illness, unspecified: Secondary | ICD-10-CM | POA: Diagnosis not present

## 2016-09-05 ENCOUNTER — Encounter: Payer: Self-pay | Admitting: Family Medicine

## 2016-09-05 ENCOUNTER — Ambulatory Visit (INDEPENDENT_AMBULATORY_CARE_PROVIDER_SITE_OTHER): Payer: Medicare HMO | Admitting: Family Medicine

## 2016-09-05 VITALS — BP 124/86 | Ht 68.0 in | Wt 162.2 lb

## 2016-09-05 DIAGNOSIS — I10 Essential (primary) hypertension: Secondary | ICD-10-CM | POA: Diagnosis not present

## 2016-09-05 DIAGNOSIS — Z79899 Other long term (current) drug therapy: Secondary | ICD-10-CM

## 2016-09-05 MED ORDER — OMEPRAZOLE 20 MG PO CPDR: 20.0000 mg | DELAYED_RELEASE_CAPSULE | Freq: Every day | ORAL | 1 refills | Status: DC

## 2016-09-05 NOTE — Progress Notes (Signed)
   Subjective:    Patient ID: David Pham, male    DOB: 1950/08/10, 66 y.o.   MRN: [id number]  Hypertension  This is a new problem. The current episode started more than 1 month ago. There are no compliance problems.    He relates intermittent back and neck pain he denies radiation down the arms or legs. He is trying do some stretching  He has taken his blood pressure medicine regular basis denies any problem try to limit the amount of salt intake. Denies chest pressure tightness pain shortness of breath. Patient has concerns of neck, and back pain.  Review of Systems Please see above.    Objective:   Physical Exam Lungs clear no crackles heart regular pulse normal BP good extremities no edema skin warm dry  Blood pressure good but will be better once he gets into regular physical exercise and watching diet     Assessment & Plan:  Healthy diet regular physical activity plus also continue taking blood pressure medicine. If on any ongoing troubles or problems to notify us follow-up in a couple months time lab work at that time for a wellness exam

## 2016-09-28 DIAGNOSIS — Z0100 Encounter for examination of eyes and vision without abnormal findings: Secondary | ICD-10-CM | POA: Diagnosis not present

## 2016-09-28 DIAGNOSIS — H2510 Age-related nuclear cataract, unspecified eye: Secondary | ICD-10-CM | POA: Diagnosis not present

## 2016-09-28 DIAGNOSIS — I10 Essential (primary) hypertension: Secondary | ICD-10-CM | POA: Diagnosis not present

## 2016-09-28 DIAGNOSIS — H52229 Regular astigmatism, unspecified eye: Secondary | ICD-10-CM | POA: Diagnosis not present

## 2016-10-10 DIAGNOSIS — R69 Illness, unspecified: Secondary | ICD-10-CM | POA: Diagnosis not present

## 2016-10-27 ENCOUNTER — Other Ambulatory Visit: Payer: Self-pay | Admitting: Family Medicine

## 2016-10-27 DIAGNOSIS — Z79899 Other long term (current) drug therapy: Secondary | ICD-10-CM | POA: Diagnosis not present

## 2016-10-27 DIAGNOSIS — I10 Essential (primary) hypertension: Secondary | ICD-10-CM | POA: Diagnosis not present

## 2016-10-28 LAB — CBC WITH DIFFERENTIAL/PLATELET
BASOS ABS: 0 10*3/uL (ref 0.0–0.2)
Basos: 0 %
EOS (ABSOLUTE): 0.1 10*3/uL (ref 0.0–0.4)
Eos: 2 %
Hematocrit: 42.7 % (ref 37.5–51.0)
Hemoglobin: 14.3 g/dL (ref 13.0–17.7)
IMMATURE GRANS (ABS): 0 10*3/uL (ref 0.0–0.1)
Immature Granulocytes: 0 %
LYMPHS: 46 %
Lymphocytes Absolute: 2.8 10*3/uL (ref 0.7–3.1)
MCH: 29.7 pg (ref 26.6–33.0)
MCHC: 33.5 g/dL (ref 31.5–35.7)
MCV: 89 fL (ref 79–97)
MONOS ABS: 0.5 10*3/uL (ref 0.1–0.9)
Monocytes: 9 %
NEUTROS PCT: 43 %
Neutrophils Absolute: 2.7 10*3/uL (ref 1.4–7.0)
PLATELETS: 211 10*3/uL (ref 150–379)
RBC: 4.81 x10E6/uL (ref 4.14–5.80)
RDW: 14.3 % (ref 12.3–15.4)
WBC: 6.2 10*3/uL (ref 3.4–10.8)

## 2016-10-28 LAB — LIPID PANEL
Chol/HDL Ratio: 3.9 ratio (ref 0.0–5.0)
Cholesterol, Total: 165 mg/dL (ref 100–199)
HDL: 42 mg/dL (ref >39)
LDL Calculated: 101 mg/dL — ABNORMAL HIGH (ref 0–99)
TRIGLYCERIDES: 112 mg/dL (ref 0–149)
VLDL CHOLESTEROL CAL: 22 mg/dL (ref 5–40)

## 2016-10-28 LAB — BASIC METABOLIC PANEL
BUN/Creatinine Ratio: 12 (ref 10–24)
BUN: 9 mg/dL (ref 8–27)
CO2: 25 mmol/L (ref 18–29)
CREATININE: 0.77 mg/dL (ref 0.76–1.27)
Calcium: 9.3 mg/dL (ref 8.6–10.2)
Chloride: 102 mmol/L (ref 96–106)
GFR calc non Af Amer: 95 mL/min/{1.73_m2} (ref >59)
GFR, EST AFRICAN AMERICAN: 109 mL/min/{1.73_m2} (ref >59)
Glucose: 103 mg/dL — ABNORMAL HIGH (ref 65–99)
POTASSIUM: 4.9 mmol/L (ref 3.5–5.2)
SODIUM: 142 mmol/L (ref 134–144)

## 2016-10-28 LAB — HEPATIC FUNCTION PANEL
ALK PHOS: 72 IU/L (ref 39–117)
ALT: 17 IU/L (ref 0–44)
AST: 18 IU/L (ref 0–40)
Albumin: 4.4 g/dL (ref 3.6–4.8)
Bilirubin Total: 0.8 mg/dL (ref 0.0–1.2)
Bilirubin, Direct: 0.19 mg/dL (ref 0.00–0.40)
Total Protein: 6.7 g/dL (ref 6.0–8.5)

## 2016-11-01 ENCOUNTER — Encounter: Payer: PPO | Admitting: Family Medicine

## 2016-11-06 ENCOUNTER — Ambulatory Visit (INDEPENDENT_AMBULATORY_CARE_PROVIDER_SITE_OTHER): Payer: Medicare HMO | Admitting: Family Medicine

## 2016-11-06 ENCOUNTER — Encounter: Payer: Self-pay | Admitting: Family Medicine

## 2016-11-06 VITALS — BP 122/80 | Ht 68.0 in | Wt 161.8 lb

## 2016-11-06 DIAGNOSIS — Z0000 Encounter for general adult medical examination without abnormal findings: Secondary | ICD-10-CM

## 2016-11-06 DIAGNOSIS — I10 Essential (primary) hypertension: Secondary | ICD-10-CM | POA: Insufficient documentation

## 2016-11-06 DIAGNOSIS — M778 Other enthesopathies, not elsewhere classified: Secondary | ICD-10-CM | POA: Insufficient documentation

## 2016-11-06 DIAGNOSIS — M7582 Other shoulder lesions, left shoulder: Secondary | ICD-10-CM | POA: Diagnosis not present

## 2016-11-06 DIAGNOSIS — J301 Allergic rhinitis due to pollen: Secondary | ICD-10-CM | POA: Diagnosis not present

## 2016-11-06 DIAGNOSIS — Z23 Encounter for immunization: Secondary | ICD-10-CM

## 2016-11-06 MED ORDER — AMLODIPINE BESYLATE 2.5 MG PO TABS: 2.5000 mg | ORAL_TABLET | Freq: Every day | ORAL | 5 refills | Status: DC

## 2016-11-06 MED ORDER — TRIAMCINOLONE ACETONIDE 55 MCG/ACT NA AERO: 2.0000 | INHALATION_SPRAY | Freq: Every day | NASAL | 5 refills | Status: DC

## 2016-11-06 NOTE — Patient Instructions (Addendum)
Next colonoscopy 2022.

## 2016-11-06 NOTE — Progress Notes (Signed)
   Subjective:    Patient ID: David Pham, male    DOB: [birth year], 66 y.o.   MRN: [id number]  HPI  The patient comes in today for a wellness visit.    A review of their health history was completed.  A review of medications was also completed.  Any needed refills; yes  Eating habits: eating healthy  Falls/  MVA accidents in past few months: none  Regular exercise: walking  Specialist pt sees on regular basis: urologist one a year  Preventative health issues were discussed.   Additional concerns: left shoulder pain   Review of Systems  Constitutional: Negative for activity change, appetite change and fatigue.  HENT: Negative for congestion.   Respiratory: Negative for cough.   Cardiovascular: Negative for chest pain.  Gastrointestinal: Negative for abdominal pain.  Endocrine: Negative for polydipsia and polyphagia.  Neurological: Negative for weakness.  Psychiatric/Behavioral: Negative for confusion.       Objective:   Physical Exam  Constitutional: He appears well-nourished. No distress.  Cardiovascular: Normal rate, regular rhythm and normal heart sounds.   No murmur heard. Pulmonary/Chest: Effort normal and breath sounds normal. No respiratory distress.  Musculoskeletal: He exhibits no edema.  Lymphadenopathy:    He has no cervical adenopathy.  Neurological: He is alert.  Psychiatric: His behavior is normal.  Vitals reviewed.  Patient with some mild crepitus noted in the left shoulder. No laxity no weakness.  Patient with multiple skin tags under his arms he was concerned about he can have these removed at a future visit  Blood pressure doing very well with current medication tolerating it well  Complains of allergy related issues we went over treatments he could do for these.     Assessment & Plan:  Adult wellness-complete.wellness physical was conducted today. Importance of diet and exercise were discussed in detail. In addition to this a  discussion regarding safety was also covered. We also reviewed over immunizations and gave recommendations regarding current immunization needed for age. In addition to this additional areas were also touched on including: Preventative health exams needed: Colonoscopy up-to-date on colonoscopy 2022  Patient was advised yearly wellness exam  Blood pressure good control watch diet continue medication  Reflux good control may reduce Prilosec to every other day  Allergic rhinitis loratadine and Nasacort as directed  Left shoulder crepitus exercises shown and given if ongoing trouble may need orthopedic

## 2016-11-07 ENCOUNTER — Telehealth: Payer: Self-pay | Admitting: Family Medicine

## 2016-11-07 NOTE — Telephone Encounter (Signed)
Some people can have localized reactions. If it's itching intensely he can take a half a Benadryl in the evening. Cool compresses can help as well. I will be happy to see him as a courtesy visit tomorrow-he could come by so the nurse could look at it then show it to me.-There is no need to be on any specific medicines for this

## 2016-11-07 NOTE — Telephone Encounter (Signed)
Got a Pneumonia shot yesterday and says it's raised with a rash today and wanted to know if this is normal?

## 2016-11-07 NOTE — Telephone Encounter (Signed)
Spoke with patient and informed him per Dr.Scott Luking- Some people can have localized reactions. If it's itching intensely he can take a half a Benadryl in the evening. Cool compresses can help as well. Dr.Scott will be happy to see you  as a courtesy visit tomorrow. Patient verbalized understanding.

## 2017-01-01 DIAGNOSIS — R69 Illness, unspecified: Secondary | ICD-10-CM | POA: Diagnosis not present

## 2017-01-02 ENCOUNTER — Ambulatory Visit (INDEPENDENT_AMBULATORY_CARE_PROVIDER_SITE_OTHER): Payer: Medicare HMO | Admitting: Family Medicine

## 2017-01-02 ENCOUNTER — Encounter: Payer: Self-pay | Admitting: Family Medicine

## 2017-01-02 VITALS — BP 132/86 | Ht 68.0 in | Wt 161.0 lb

## 2017-01-02 DIAGNOSIS — M7542 Impingement syndrome of left shoulder: Secondary | ICD-10-CM | POA: Diagnosis not present

## 2017-01-02 NOTE — Progress Notes (Signed)
   Subjective:    Patient ID: David Pham, male    DOB: 01-05-1951, 66 y.o.   MRN: [id number]  Shoulder Pain   Pain location: left shoulder. This is a new problem. Episode onset: 3 - 4 days. He has tried acetaminophen for the symptoms. The treatment provided no relief.   Shoulder pain off and on,,no sur g after injur  Long standing Pain, hx of  injec, definitely helped in the past     Deep in the shoulder, worse with certain motions   Review of Systems No headache, no major weight loss or weight gain, no chest pain no back pain abdominal pain no change in bowel habits complete ROS otherwise negative     Objective:   Physical Exam Alert vitals stable, NAD. Blood pressure good on repeat. HEENT normal. Lungs clear. Heart regular rate and rhythm.  Positive impingement sign  Patient was prepped draped injected 1 mL Depo-Medrol 2 mL Xylocaine posterior lateral approach       Assessment & Plan:  Impression impingement left shoulder discussed, pluses and minuses an injection discussed. Patient like to proceed plan range of motion exercises discussed in encourage

## 2017-01-06 MED ORDER — METHYLPREDNISOLONE ACETATE 40 MG/ML IJ SUSP: 40.0000 mg | Freq: Once | INTRAMUSCULAR | Status: DC

## 2017-02-09 ENCOUNTER — Ambulatory Visit (INDEPENDENT_AMBULATORY_CARE_PROVIDER_SITE_OTHER): Payer: Medicare HMO | Admitting: Family Medicine

## 2017-02-09 ENCOUNTER — Encounter: Payer: Self-pay | Admitting: Family Medicine

## 2017-02-09 VITALS — BP 128/84 | Ht 68.0 in | Wt 162.4 lb

## 2017-02-09 DIAGNOSIS — R229 Localized swelling, mass and lump, unspecified: Secondary | ICD-10-CM

## 2017-02-09 DIAGNOSIS — IMO0002 Reserved for concepts with insufficient information to code with codable children: Secondary | ICD-10-CM

## 2017-02-09 NOTE — Progress Notes (Signed)
   Subjective:    Patient ID: David Pham, male    DOB: 1950-07-31, 66 y.o.   MRN: [id number]  HPI  Patient arrives with c/o knot on right leg for a few weeks.  No particular tenderness.  Recalls no injury.  Feels a nodule on his calf concerned about it. No difficulty walking  Review of Systems No headache, no major weight loss or weight gain, no chest pain no back pain abdominal pain no change in bowel habits complete ROS otherwise negative     Objective:   Physical Exam Alert vitals stable, NAD. Blood pressure good on repeat. HEENT normal. Lungs clear. Heart regular rate and rhythm. Right lateral calf small firm papular nodular homogenous 2 cm nodule superior aspect of lateral gastrocnemius       Assessment & Plan:  Impression nodule question ganglion cyst versus other etiology hoping to try to avoid major imaging expense plan ultrasound further recommendations based results

## 2017-02-12 ENCOUNTER — Ambulatory Visit (HOSPITAL_COMMUNITY)
Admission: RE | Admit: 2017-02-12 | Discharge: 2017-02-12 | Disposition: A | Discharge status: Home / Self Care | Payer: Medicare HMO | Source: Ambulatory Visit | Attending: Family Medicine | Admitting: Family Medicine

## 2017-02-12 DIAGNOSIS — M7122 Synovial cyst of popliteal space [Baker], left knee: Secondary | ICD-10-CM | POA: Diagnosis not present

## 2017-02-12 DIAGNOSIS — R229 Localized swelling, mass and lump, unspecified: Secondary | ICD-10-CM | POA: Insufficient documentation

## 2017-02-13 ENCOUNTER — Telehealth: Payer: Self-pay | Admitting: *Deleted

## 2017-02-13 NOTE — Telephone Encounter (Signed)
Patient called to get the results of his MRI. Please advise [phone]

## 2017-02-14 ENCOUNTER — Encounter: Payer: Self-pay | Admitting: Family Medicine

## 2017-02-14 NOTE — Telephone Encounter (Signed)
See result note.  

## 2017-02-14 NOTE — Addendum Note (Signed)
Addended by: Launa Grill on: 02/14/2017 08:37 AM   Modules accepted: Orders

## 2017-02-19 ENCOUNTER — Telehealth: Payer: Self-pay | Admitting: Family Medicine

## 2017-02-19 NOTE — Telephone Encounter (Signed)
Sister advised that per Dr Richardson Landry: Ultrasound reveals probable atypical Baker's cysts. As large as it is Dr Richardson Landry recommends orthopedic referral. They can assess in the and see whether he needs a very costly MRI or if they can simply just drain it. Or observe it. Sister verbalized understanding.

## 2017-02-19 NOTE — Telephone Encounter (Signed)
Notes recorded by Mikey Kirschner, MD on 02/14/2017 at 7:17 AM EDT Ultrasound reveals probable atypical Baker's cysts. As large as it is I recommend orthopedic referral. They can assess in the and see whether he needs a very costly MRI or if they can simply just drain it. Or observe it.

## 2017-02-19 NOTE — Telephone Encounter (Signed)
Patients sister is requesting a nurse to call her back in reference to the results to his recent test.  We have DPR signed.

## 2017-02-21 ENCOUNTER — Ambulatory Visit (INDEPENDENT_AMBULATORY_CARE_PROVIDER_SITE_OTHER): Payer: Medicare HMO | Admitting: Orthopaedic Surgery

## 2017-02-21 DIAGNOSIS — M25861 Other specified joint disorders, right knee: Secondary | ICD-10-CM | POA: Diagnosis not present

## 2017-02-21 DIAGNOSIS — R2241 Localized swelling, mass and lump, right lower limb: Secondary | ICD-10-CM | POA: Insufficient documentation

## 2017-02-21 MED ORDER — LIDOCAINE HCL 1 % IJ SOLN
3.0000 mL | INTRAMUSCULAR | Status: AC | PRN
  Administered 2017-02-21: 3 mL

## 2017-02-21 NOTE — Progress Notes (Signed)
Office Visit Note   Patient: David Pham           Date of Birth: [birth year]           MRN: [id number] Visit Date: 02/21/2017              Requested by: Kathyrn Drown, MD [place], [place] [address] PCP: Kathyrn Drown, MD   Assessment & Plan: Visit Diagnoses:  1. Mass of right knee   ganglion cyst  Plan: Aspirate cyst lateral aspect right knee-consistent with ganglion cyst. Asymptomatic. Office 1 month. I aspirated the distal to 2 cyst with thickened ganglionic fluid. I wouldn't be surprised if he has a lateral meniscal cyst alone is asymptomatic in the cyst tracked along the lateral aspect of the knee. I agree that these would be an unusual presentation of a popliteal cyst. It may be worthwhile to consider an MRI scan if he is at all symptomatic. He'll check back in one month.  Follow-Up Instructions: Return in about 1 month (around 03/24/2017).   Orders:  No orders of the defined types were placed in this encounter.  No orders of the defined types were placed in this encounter.     Procedures: Large Joint Inj Date/Time: 02/21/2017 2:50 PM Performed by: Garald Balding Authorized by: Garald Balding   Consent Given by:  Patient Timeout: prior to procedure the correct patient, procedure, and site was verified   Indications:  Joint swelling Location:  Knee Site:  R knee Prep: patient was prepped and draped in usual sterile fashion   Needle Size:  18 G Needle Length:  1.5 inches Approach:  Anterolateral Ultrasound Guidance: No   Fluoroscopic Guidance: No   Arthrogram: No   Medications:  3 mL lidocaine 1 % Aspiration Attempted: Yes   Aspirate amount (mL):  3 Aspirate:  Clear Patient tolerance:  Patient tolerated the procedure well with no immediate complications     Clinical Data: No additional findings.   Subjective: No chief complaint on file. David Pham 66 years old and is seen at the request of Dr. Sallee Lange for  evaluation of several masses along the lateral aspect of his right knee. David Pham relates he is not having "any pain". He likes to walk a mile or so every day and plays golf but does remember any injury or trauma. He's been having the masses for approximately 2-3 weeks.Dr Wolfgang Phoenix appropriately ordered an ultrasound demonstrating 2 complex cystic areas along the proximal  left calf. The larger appeared that it could be a Baker's cyst along was located much further lateral typical cyst. There is no compromise of his activities. He denies any specific knee pain.  HPI  Review of Systems   Objective: Vital Signs: There were no vitals taken for this visit.  Physical Exam  Ortho Exam right knee without effusion. No localized areas of tenderness. Full flexion and extension. No pain with weightbearing. 2 cystic masses are identified along the lateral aspect of the knee 1 is located between the lateral hamstring and the iliotibial band measuring about 2-3 x 3 cm a second one is little bit more posterior and distal along the fibular head. There is no neurologic deficit no tingling. There is no popliteal mass. There is absolutely no pain redness or ecchymosis.  Specialty Comments:  No specialty comments available.  Imaging: No results found.   PMFS History: Patient Active Problem List   Diagnosis Date Noted  . Mass  of right knee 02/21/2017  . HTN (hypertension) 11/06/2016  . Left shoulder tendinitis 11/06/2016  . Spondylolisthesis at L5-S1 level 09/23/2015  . History of prostatectomy 05/11/2014  . BACK PAIN 09/10/2008  . ELEVATED PROSTATE SPECIFIC ANTIGEN 07/06/2008  . COLONIC POLYPS 06/01/2008  . TINNITUS, CHRONIC, BILATERAL 06/01/2008  . Allergic rhinitis 06/01/2008  . PNEUMONIA, LEFT 06/01/2008  . DEGENERATIVE JOINT DISEASE 06/01/2008  . ESOPHAGEAL REFLUX 11/29/2007  . DYSPHAGIA UNSPECIFIED 10/28/2007   Past Medical History:  Diagnosis Date  . Cancer ([facility]) 09/2009   prostate  .  Hemorrhoids   . Incontinence of feces   . Rectal pain   . White coat hypertension     Family History  Problem Relation Age of Onset  . Cancer Sister        breast  . Hypertension Brother   . Diabetes Brother     Past Surgical History:  Procedure Laterality Date  . COLONOSCOPY  2006 and 2012  . COLONOSCOPY  08/04/2010  . PROSTATECTOMY     2011   Social History   Occupational History  . Not on file.   Social History Pham Topics  . Smoking status: Never Smoker  . Smokeless tobacco: Never Used  . Alcohol use Not on file  . Drug use: Unknown  . Sexual activity: Not on file     Garald Balding, MD   Note - This record has been created using [employer].  Chart creation errors have been sought, but may not always  have been located. Such creation errors do not reflect on  the standard of medical care.

## 2017-03-28 ENCOUNTER — Ambulatory Visit (INDEPENDENT_AMBULATORY_CARE_PROVIDER_SITE_OTHER): Payer: Medicare HMO | Admitting: Orthopaedic Surgery

## 2017-03-28 ENCOUNTER — Encounter (INDEPENDENT_AMBULATORY_CARE_PROVIDER_SITE_OTHER): Payer: Self-pay | Admitting: Orthopaedic Surgery

## 2017-03-28 VITALS — BP 138/85 | HR 71 | Resp 14 | Ht 72.0 in | Wt 165.0 lb

## 2017-03-28 DIAGNOSIS — M25861 Other specified joint disorders, right knee: Secondary | ICD-10-CM | POA: Diagnosis not present

## 2017-03-28 DIAGNOSIS — R2241 Localized swelling, mass and lump, right lower limb: Secondary | ICD-10-CM

## 2017-03-28 NOTE — Progress Notes (Signed)
Office Visit Note   Patient: David Pham           Date of Birth: April 21, 1951           MRN: [id number] Visit Date: 03/28/2017              Requested by: Kathyrn Drown, MD [address], [place] [id number] PCP: Kathyrn Drown, MD   Assessment & Plan: Visit Diagnoses:  1. Mass of right knee   ganglion cysts-asymptomatic  Plan: long discussion re benign cysts right knee as previously diagnosed. Consider MRI . After further discussion pt wants to wait  Follow-Up Instructions: Return if symptoms worsen or fail to improve.   Orders:  No orders of the defined types were placed in this encounter.  No orders of the defined types were placed in this encounter.     Procedures: No procedures performed   Clinical Data: No additional findings.   Subjective: Chief Complaint  Patient presents with  . Right Lower Leg - Mass    David Pham is a 66 y o that is here for a follow up of the Right lower leg mass. He relates he has been walking more but does not think the mass has grown.he still plays golf and other activities   Able to perform all activities without restriction. No pain. Masses right knee smaller since aspiration last month. HPI  Review of Systems  Constitutional: Negative for fatigue.  HENT: Negative for hearing loss.   Respiratory: Negative for apnea, chest tightness and shortness of breath.   Cardiovascular: Positive for leg swelling. Negative for chest pain and palpitations.  Gastrointestinal: Negative for blood in stool, constipation and diarrhea.  Genitourinary: Negative for difficulty urinating.  Musculoskeletal: Negative for arthralgias, back pain, joint swelling, myalgias, neck pain and neck stiffness.  Neurological: Negative for weakness, numbness and headaches.  Hematological: Does not bruise/bleed easily.  Psychiatric/Behavioral: Negative for sleep disturbance. The patient is not nervous/anxious.      Objective: Vital Signs: BP  138/85   Pulse 71   Resp 14   Ht 6' (1.829 m)   Wt 165 lb (74.8 kg)   BMI 22.38 kg/m   Physical Exam  Ortho Examright knee cysts still present later and posterior. No pain. No redness or erythema. Positive popliteal cyst as well. Also smaller and similar cysts left knee-asymptomatic. N/v intact. walks without limp  Specialty Comments:  No specialty comments available.  Imaging: No results found.   PMFS History: Patient Active Problem List   Diagnosis Date Noted  . Mass of right knee 02/21/2017  . HTN (hypertension) 11/06/2016  . Left shoulder tendinitis 11/06/2016  . Spondylolisthesis at L5-S1 level 09/23/2015  . History of prostatectomy 05/11/2014  . BACK PAIN 09/10/2008  . ELEVATED PROSTATE SPECIFIC ANTIGEN 07/06/2008  . COLONIC POLYPS 06/01/2008  . TINNITUS, CHRONIC, BILATERAL 06/01/2008  . Allergic rhinitis 06/01/2008  . PNEUMONIA, LEFT 06/01/2008  . DEGENERATIVE JOINT DISEASE 06/01/2008  . ESOPHAGEAL REFLUX 11/29/2007  . DYSPHAGIA UNSPECIFIED 10/28/2007   Past Medical History:  Diagnosis Date  . Cancer ([facility]) 09/2009   prostate  . Hemorrhoids   . Incontinence of feces   . Rectal pain   . White coat hypertension     Family History  Problem Relation Age of Onset  . Cancer Sister        breast  . Hypertension Brother   . Diabetes Brother     Past Surgical History:  Procedure Laterality Date  .  COLONOSCOPY  2006 and 2012  . COLONOSCOPY  08/04/2010  . PROSTATECTOMY     2011   Social History   Occupational History  . Not on file.   Social History Main Topics  . Smoking status: Never Smoker  . Smokeless tobacco: Never Used  . Alcohol use Not on file  . Drug use: Unknown  . Sexual activity: Not on file

## 2017-04-10 DIAGNOSIS — H04123 Dry eye syndrome of bilateral lacrimal glands: Secondary | ICD-10-CM | POA: Diagnosis not present

## 2017-04-10 DIAGNOSIS — H353131 Nonexudative age-related macular degeneration, bilateral, early dry stage: Secondary | ICD-10-CM | POA: Diagnosis not present

## 2017-04-10 DIAGNOSIS — H2513 Age-related nuclear cataract, bilateral: Secondary | ICD-10-CM | POA: Diagnosis not present

## 2017-05-05 ENCOUNTER — Other Ambulatory Visit: Payer: Self-pay | Admitting: Family Medicine

## 2017-05-06 ENCOUNTER — Other Ambulatory Visit: Payer: Self-pay | Admitting: Family Medicine

## 2017-05-07 NOTE — Telephone Encounter (Signed)
Last seen 03/04/17

## 2017-05-08 ENCOUNTER — Encounter: Payer: Self-pay | Admitting: Family Medicine

## 2017-05-08 ENCOUNTER — Ambulatory Visit (INDEPENDENT_AMBULATORY_CARE_PROVIDER_SITE_OTHER): Payer: Medicare HMO | Admitting: Family Medicine

## 2017-05-08 VITALS — BP 112/70 | Ht 68.0 in | Wt 162.0 lb

## 2017-05-08 DIAGNOSIS — I10 Essential (primary) hypertension: Secondary | ICD-10-CM

## 2017-05-08 DIAGNOSIS — E785 Hyperlipidemia, unspecified: Secondary | ICD-10-CM | POA: Diagnosis not present

## 2017-05-08 DIAGNOSIS — R69 Illness, unspecified: Secondary | ICD-10-CM | POA: Diagnosis not present

## 2017-05-08 DIAGNOSIS — M5431 Sciatica, right side: Secondary | ICD-10-CM

## 2017-05-08 DIAGNOSIS — K219 Gastro-esophageal reflux disease without esophagitis: Secondary | ICD-10-CM

## 2017-05-08 MED ORDER — OMEPRAZOLE 20 MG PO CPDR: 20.0000 mg | DELAYED_RELEASE_CAPSULE | Freq: Every day | ORAL | 1 refills | Status: DC

## 2017-05-08 MED ORDER — AMLODIPINE BESYLATE 2.5 MG PO TABS: 2.5000 mg | ORAL_TABLET | Freq: Every day | ORAL | 1 refills | Status: DC

## 2017-05-08 NOTE — Patient Instructions (Addendum)
Back Exercises If you have pain in your back, do these exercises 2-3 times each day or as told by your doctor. When the pain goes away, do the exercises once each day, but repeat the steps more times for each exercise (do more repetitions). If you do not have pain in your back, do these exercises once each day or as told by your doctor. Exercises Single Knee to Chest  Do these steps 3-5 times in a row for each leg: 1. Lie on your back on a firm bed or the floor with your legs stretched out. 2. Bring one knee to your chest. 3. Hold your knee to your chest by grabbing your knee or thigh. 4. Pull on your knee until you feel a gentle stretch in your lower back. 5. Keep doing the stretch for 10-30 seconds. 6. Slowly let go of your leg and straighten it.  Pelvic Tilt  Do these steps 5-10 times in a row: 1. Lie on your back on a firm bed or the floor with your legs stretched out. 2. Bend your knees so they point up to the ceiling. Your feet should be flat on the floor. 3. Tighten your lower belly (abdomen) muscles to press your lower back against the floor. This will make your tailbone point up to the ceiling instead of pointing down to your feet or the floor. 4. Stay in this position for 5-10 seconds while you gently tighten your muscles and breathe evenly.  Cat-Cow  Do these steps until your lower back bends more easily: 1. Get on your hands and knees on a firm surface. Keep your hands under your shoulders, and keep your knees under your hips. You may put padding under your knees. 2. Let your head hang down, and make your tailbone point down to the floor so your lower back is round like the back of a cat. 3. Stay in this position for 5 seconds. 4. Slowly lift your head and make your tailbone point up to the ceiling so your back hangs low (sags) like the back of a cow. 5. Stay in this position for 5 seconds.  Press-Ups  Do these steps 5-10 times in a row: 1. Lie on your belly (face-down)  on the floor. 2. Place your hands near your head, about shoulder-width apart. 3. While you keep your back relaxed and keep your hips on the floor, slowly straighten your arms to raise the top half of your body and lift your shoulders. Do not use your back muscles. To make yourself more comfortable, you may change where you place your hands. 4. Stay in this position for 5 seconds. 5. Slowly return to lying flat on the floor.  Bridges  Do these steps 10 times in a row: 1. Lie on your back on a firm surface. 2. Bend your knees so they point up to the ceiling. Your feet should be flat on the floor. 3. Tighten your butt muscles and lift your butt off of the floor until your waist is almost as high as your knees. If you do not feel the muscles working in your butt and the back of your thighs, slide your feet 1-2 inches farther away from your butt. 4. Stay in this position for 3-5 seconds. 5. Slowly lower your butt to the floor, and let your butt muscles relax.  If this exercise is too easy, try doing it with your arms crossed over your chest. Belly Crunches  Do these steps 5-10 times in   a row: 1. Lie on your back on a firm bed or the floor with your legs stretched out. 2. Bend your knees so they point up to the ceiling. Your feet should be flat on the floor. 3. Cross your arms over your chest. 4. Tip your chin a little bit toward your chest but do not bend your neck. 5. Tighten your belly muscles and slowly raise your chest just enough to lift your shoulder blades a tiny bit off of the floor. 6. Slowly lower your chest and your head to the floor.  Back Lifts Do these steps 5-10 times in a row: 1. Lie on your belly (face-down) with your arms at your sides, and rest your forehead on the floor. 2. Tighten the muscles in your legs and your butt. 3. Slowly lift your chest off of the floor while you keep your hips on the floor. Keep the back of your head in line with the curve in your back. Look at  the floor while you do this. 4. Stay in this position for 3-5 seconds. 5. Slowly lower your chest and your face to the floor.  Contact a doctor if:  Your back pain gets a lot worse when you do an exercise.  Your back pain does not lessen 2 hours after you exercise. If you have any of these problems, stop doing the exercises. Do not do them again unless your doctor says it is okay. Get help right away if:  You have sudden, very bad back pain. If this happens, stop doing the exercises. Do not do them again unless your doctor says it is okay. This information is not intended to replace advice given to you by your health care provider. Make sure you discuss any questions you have with your health care provider. Document Released: 08/12/2010 Document Revised: 12/16/2015 Document Reviewed: 09/03/2014 Elsevier Interactive Patient Education  2018 [place] Eating Plan DASH stands for "Dietary Approaches to Stop Hypertension." The DASH eating plan is a healthy eating plan that has been shown to reduce high blood pressure (hypertension). It may also reduce your risk for type 2 diabetes, heart disease, and stroke. The DASH eating plan may also help with weight loss. What are tips for following this plan? General guidelines  Avoid eating more than 2,300 mg (milligrams) of salt (sodium) a day. If you have hypertension, you may need to reduce your sodium intake to 1,500 mg a day.  Limit alcohol intake to no more than 1 drink a day for nonpregnant women and 2 drinks a day for men. One drink equals 12 oz of beer, 5 oz of wine, or 1 oz of hard liquor.  Work with your health care provider to maintain a healthy body weight or to lose weight. Ask what an ideal weight is for you.  Get at least 30 minutes of exercise that causes your heart to beat faster (aerobic exercise) most days of the week. Activities may include walking, swimming, or biking.  Work with your health care provider or diet and  nutrition specialist (dietitian) to adjust your eating plan to your individual calorie needs. Reading food labels  Check food labels for the amount of sodium per serving. Choose foods with less than 5 percent of the Daily Value of sodium. Generally, foods with less than 300 mg of sodium per serving fit into this eating plan.  To find whole grains, look for the word "whole" as the first word in the ingredient list. Shopping  Buy products labeled as "low-sodium" or "no salt added."  Buy fresh foods. Avoid canned foods and premade or frozen meals. Cooking  Avoid adding salt when cooking. Use salt-free seasonings or herbs instead of table salt or sea salt. Check with your health care provider or pharmacist before using salt substitutes.  Do not fry foods. Cook foods using healthy methods such as baking, boiling, grilling, and broiling instead.  Cook with heart-healthy oils, such as olive, canola, soybean, or sunflower oil. Meal planning   Eat a balanced diet that includes: ? 5 or more servings of fruits and vegetables each day. At each meal, try to fill half of your plate with fruits and vegetables. ? Up to 6-8 servings of whole grains each day. ? Less than 6 oz of lean meat, poultry, or fish each day. A 3-oz serving of meat is about the same size as a deck of cards. One egg equals 1 oz. ? 2 servings of low-fat dairy each day. ? A serving of nuts, seeds, or beans 5 times each week. ? Heart-healthy fats. Healthy fats called Omega-3 fatty acids are found in foods such as flaxseeds and coldwater fish, like sardines, salmon, and mackerel.  Limit how much you eat of the following: ? Canned or prepackaged foods. ? Food that is high in trans fat, such as fried foods. ? Food that is high in saturated fat, such as fatty meat. ? Sweets, desserts, sugary drinks, and other foods with added sugar. ? Full-fat dairy products.  Do not salt foods before eating.  Try to eat at least 2 vegetarian  meals each week.  Eat more home-cooked food and less restaurant, buffet, and fast food.  When eating at a restaurant, ask that your food be prepared with less salt or no salt, if possible. What foods are recommended? The items listed may not be a complete list. Talk with your dietitian about what dietary choices are best for you. Grains Whole-grain or whole-wheat bread. Whole-grain or whole-wheat pasta. Brown rice. [place]. Bulgur. Whole-grain and low-sodium cereals. Pita bread. Low-fat, low-sodium crackers. Whole-wheat flour tortillas. Vegetables Fresh or frozen vegetables (raw, steamed, roasted, or grilled). Low-sodium or reduced-sodium tomato and vegetable juice. Low-sodium or reduced-sodium tomato sauce and tomato paste. Low-sodium or reduced-sodium canned vegetables. Fruits All fresh, dried, or frozen fruit. Canned fruit in natural juice (without added sugar). Meat and other protein foods Skinless chicken or [place]. Ground chicken or [place]. Pork with fat trimmed off. Fish and seafood. Egg whites. Dried beans, peas, or lentils. Unsalted nuts, nut butters, and seeds. Unsalted canned beans. Lean cuts of beef with fat trimmed off. Low-sodium, lean deli meat. Dairy Low-fat (1%) or fat-free (skim) milk. Fat-free, low-fat, or reduced-fat cheeses. Nonfat, low-sodium ricotta or cottage cheese. Low-fat or nonfat yogurt. Low-fat, low-sodium cheese. Fats and oils Soft margarine without trans fats. Vegetable oil. Low-fat, reduced-fat, or light mayonnaise and salad dressings (reduced-sodium). Canola, safflower, olive, soybean, and sunflower oils. Avocado. Seasoning and other foods Herbs. Spices. Seasoning mixes without salt. Unsalted popcorn and pretzels. Fat-free sweets. What foods are not recommended? The items listed may not be a complete list. Talk with your dietitian about what dietary choices are best for you. Grains Baked goods made with fat, such as croissants, muffins, or some  breads. Dry pasta or rice meal packs. Vegetables Creamed or fried vegetables. Vegetables in a cheese sauce. Regular canned vegetables (not low-sodium or reduced-sodium). Regular canned tomato sauce and paste (not low-sodium or reduced-sodium). Regular tomato and vegetable juice (  not low-sodium or reduced-sodium). Angie Fava. Olives. Fruits Canned fruit in a light or heavy syrup. Fried fruit. Fruit in cream or butter sauce. Meat and other protein foods Fatty cuts of meat. Ribs. Fried meat. Berniece Salines. Sausage. Bologna and other processed lunch meats. Salami. Fatback. Hotdogs. Bratwurst. Salted nuts and seeds. Canned beans with added salt. Canned or smoked fish. Whole eggs or egg yolks. Chicken or [place] with skin. Dairy Whole or 2% milk, cream, and half-and-half. Whole or full-fat cream cheese. Whole-fat or sweetened yogurt. Full-fat cheese. Nondairy creamers. Whipped toppings. Processed cheese and cheese spreads. Fats and oils Butter. Stick margarine. Lard. Shortening. Ghee. Bacon fat. Tropical oils, such as coconut, palm kernel, or palm oil. Seasoning and other foods Salted popcorn and pretzels. Onion salt, garlic salt, seasoned salt, table salt, and sea salt. Worcestershire sauce. Tartar sauce. Barbecue sauce. Teriyaki sauce. Soy sauce, including reduced-sodium. Steak sauce. Canned and packaged gravies. Fish sauce. Oyster sauce. Cocktail sauce. Horseradish that you find on the shelf. Ketchup. Mustard. Meat flavorings and tenderizers. Bouillon cubes. Hot sauce and Tabasco sauce. Premade or packaged marinades. Premade or packaged taco seasonings. Relishes. Regular salad dressings. Where to find more information:  National Heart, Lung, and [place]: https://wilson-eaton.com/  American Heart Association: www.heart.org Summary  The DASH eating plan is a healthy eating plan that has been shown to reduce high blood pressure (hypertension). It may also reduce your risk for type 2 diabetes, heart disease, and  stroke.  With the DASH eating plan, you should limit salt (sodium) intake to 2,300 mg a day. If you have hypertension, you may need to reduce your sodium intake to 1,500 mg a day.  When on the DASH eating plan, aim to eat more fresh fruits and vegetables, whole grains, lean proteins, low-fat dairy, and heart-healthy fats.  Work with your health care provider or diet and nutrition specialist (dietitian) to adjust your eating plan to your individual calorie needs. This information is not intended to replace advice given to you by your health care provider. Make sure you discuss any questions you have with your health care provider. Document Released: 06/29/2011 Document Revised: 07/03/2016 Document Reviewed: 07/03/2016 Elsevier Interactive Patient Education  2017 [employer].

## 2017-05-08 NOTE — Progress Notes (Signed)
   Subjective:    Patient ID: David Pham, male    DOB: April 21, 1951, 66 y.o.   MRN: [id number]  Hypertension  This is a chronic problem. The current episode started more than 1 year ago. The problem has been gradually improving since onset. Pertinent negatives include no chest pain, headaches or shortness of breath.   On Norvasc 2.5 mg 1 tablet daily.Eats healthy and get exercise. Needs refills, would like the flu shot. Has lower back pain, muscular in nature. Patient states he is concerned about his cholesterol he does watch his diet He does have reflux disease when he does not take omeprazole he has significant troubles He is interested in trying some exercises to help his back he has sciatica which goes down his right leg does not cause any limping. Does play golf some Patient does have a history of systems the right lower leg he was reassured that this cyst does not cause his sciatica or involved with his sciatica Review of Systems  Constitutional: Negative for activity change, fatigue and fever.  Respiratory: Negative for cough and shortness of breath.   Cardiovascular: Negative for chest pain and leg swelling.  Neurological: Negative for headaches.       Objective:   Physical Exam  Constitutional: He appears well-nourished. No distress.  Cardiovascular: Normal rate, regular rhythm and normal heart sounds.   No murmur heard. Pulmonary/Chest: Effort normal and breath sounds normal. No respiratory distress.  Musculoskeletal: He exhibits no edema.  Lymphadenopathy:    He has no cervical adenopathy.  Neurological: He is alert.  Psychiatric: His behavior is normal.  Vitals reviewed. Cyst on the right lower leg see previous ultrasound he has had this drained by specialist if it gets larger may need to go back to specialist  25 minutes was spent with the patient. Greater than half the time was spent in discussion and answering questions and counseling regarding the issues that the  patient came in for today.      Assessment & Plan:  HTN-initially blood pressure elevated but upon follow-up later in the visit blood pressure was normal continue current medications watch diet  History hyperlipidemia patient is interested in finding out how he is doing repeat lab work await the results healthy diet recommended  Reflux disease overall in good control as long as he takes omeprazole continue current medicine  Sciatica stretching exercises recommended I do not feel patient needs MRI at this time I would not recommend injections or surgery at this time may use Tylenol when necessary  The patient will follow-up if there is any additional issues

## 2017-05-09 ENCOUNTER — Encounter: Payer: Self-pay | Admitting: Family Medicine

## 2017-05-09 LAB — BASIC METABOLIC PANEL
BUN/Creatinine Ratio: 11 (ref 10–24)
BUN: 10 mg/dL (ref 8–27)
CO2: 24 mmol/L (ref 20–29)
CREATININE: 0.87 mg/dL (ref 0.76–1.27)
Calcium: 9.1 mg/dL (ref 8.6–10.2)
Chloride: 105 mmol/L (ref 96–106)
GFR calc Af Amer: 104 mL/min/{1.73_m2} (ref >59)
GFR, EST NON AFRICAN AMERICAN: 90 mL/min/{1.73_m2} (ref >59)
Glucose: 97 mg/dL (ref 65–99)
Potassium: 5 mmol/L (ref 3.5–5.2)
SODIUM: 143 mmol/L (ref 134–144)

## 2017-05-09 LAB — LIPID PANEL
CHOL/HDL RATIO: 3.7 ratio (ref 0.0–5.0)
Cholesterol, Total: 161 mg/dL (ref 100–199)
HDL: 43 mg/dL (ref >39)
LDL CALC: 100 mg/dL — AB (ref 0–99)
TRIGLYCERIDES: 90 mg/dL (ref 0–149)
VLDL CHOLESTEROL CAL: 18 mg/dL (ref 5–40)

## 2017-05-21 DIAGNOSIS — R69 Illness, unspecified: Secondary | ICD-10-CM | POA: Diagnosis not present

## 2017-06-19 DIAGNOSIS — M546 Pain in thoracic spine: Secondary | ICD-10-CM | POA: Diagnosis not present

## 2017-06-19 DIAGNOSIS — M9902 Segmental and somatic dysfunction of thoracic region: Secondary | ICD-10-CM | POA: Diagnosis not present

## 2017-06-19 DIAGNOSIS — M47812 Spondylosis without myelopathy or radiculopathy, cervical region: Secondary | ICD-10-CM | POA: Diagnosis not present

## 2017-06-19 DIAGNOSIS — M47816 Spondylosis without myelopathy or radiculopathy, lumbar region: Secondary | ICD-10-CM | POA: Diagnosis not present

## 2017-06-19 DIAGNOSIS — M9901 Segmental and somatic dysfunction of cervical region: Secondary | ICD-10-CM | POA: Diagnosis not present

## 2017-06-19 DIAGNOSIS — M9903 Segmental and somatic dysfunction of lumbar region: Secondary | ICD-10-CM | POA: Diagnosis not present

## 2017-06-21 DIAGNOSIS — M9901 Segmental and somatic dysfunction of cervical region: Secondary | ICD-10-CM | POA: Diagnosis not present

## 2017-06-21 DIAGNOSIS — M9902 Segmental and somatic dysfunction of thoracic region: Secondary | ICD-10-CM | POA: Diagnosis not present

## 2017-06-21 DIAGNOSIS — M47812 Spondylosis without myelopathy or radiculopathy, cervical region: Secondary | ICD-10-CM | POA: Diagnosis not present

## 2017-06-21 DIAGNOSIS — M546 Pain in thoracic spine: Secondary | ICD-10-CM | POA: Diagnosis not present

## 2017-06-21 DIAGNOSIS — M9903 Segmental and somatic dysfunction of lumbar region: Secondary | ICD-10-CM | POA: Diagnosis not present

## 2017-06-21 DIAGNOSIS — M47816 Spondylosis without myelopathy or radiculopathy, lumbar region: Secondary | ICD-10-CM | POA: Diagnosis not present

## 2017-06-27 DIAGNOSIS — M546 Pain in thoracic spine: Secondary | ICD-10-CM | POA: Diagnosis not present

## 2017-06-27 DIAGNOSIS — M47812 Spondylosis without myelopathy or radiculopathy, cervical region: Secondary | ICD-10-CM | POA: Diagnosis not present

## 2017-06-27 DIAGNOSIS — M47816 Spondylosis without myelopathy or radiculopathy, lumbar region: Secondary | ICD-10-CM | POA: Diagnosis not present

## 2017-06-27 DIAGNOSIS — M9901 Segmental and somatic dysfunction of cervical region: Secondary | ICD-10-CM | POA: Diagnosis not present

## 2017-06-27 DIAGNOSIS — M9902 Segmental and somatic dysfunction of thoracic region: Secondary | ICD-10-CM | POA: Diagnosis not present

## 2017-06-27 DIAGNOSIS — M9903 Segmental and somatic dysfunction of lumbar region: Secondary | ICD-10-CM | POA: Diagnosis not present

## 2017-06-29 DIAGNOSIS — M9902 Segmental and somatic dysfunction of thoracic region: Secondary | ICD-10-CM | POA: Diagnosis not present

## 2017-06-29 DIAGNOSIS — M47812 Spondylosis without myelopathy or radiculopathy, cervical region: Secondary | ICD-10-CM | POA: Diagnosis not present

## 2017-06-29 DIAGNOSIS — M9903 Segmental and somatic dysfunction of lumbar region: Secondary | ICD-10-CM | POA: Diagnosis not present

## 2017-06-29 DIAGNOSIS — M546 Pain in thoracic spine: Secondary | ICD-10-CM | POA: Diagnosis not present

## 2017-06-29 DIAGNOSIS — M9901 Segmental and somatic dysfunction of cervical region: Secondary | ICD-10-CM | POA: Diagnosis not present

## 2017-06-29 DIAGNOSIS — M47816 Spondylosis without myelopathy or radiculopathy, lumbar region: Secondary | ICD-10-CM | POA: Diagnosis not present

## 2017-07-05 DIAGNOSIS — M546 Pain in thoracic spine: Secondary | ICD-10-CM | POA: Diagnosis not present

## 2017-07-05 DIAGNOSIS — M9903 Segmental and somatic dysfunction of lumbar region: Secondary | ICD-10-CM | POA: Diagnosis not present

## 2017-07-05 DIAGNOSIS — M47816 Spondylosis without myelopathy or radiculopathy, lumbar region: Secondary | ICD-10-CM | POA: Diagnosis not present

## 2017-07-05 DIAGNOSIS — M47812 Spondylosis without myelopathy or radiculopathy, cervical region: Secondary | ICD-10-CM | POA: Diagnosis not present

## 2017-07-05 DIAGNOSIS — M9901 Segmental and somatic dysfunction of cervical region: Secondary | ICD-10-CM | POA: Diagnosis not present

## 2017-07-05 DIAGNOSIS — M9902 Segmental and somatic dysfunction of thoracic region: Secondary | ICD-10-CM | POA: Diagnosis not present

## 2017-07-09 DIAGNOSIS — M47816 Spondylosis without myelopathy or radiculopathy, lumbar region: Secondary | ICD-10-CM | POA: Diagnosis not present

## 2017-07-09 DIAGNOSIS — M9902 Segmental and somatic dysfunction of thoracic region: Secondary | ICD-10-CM | POA: Diagnosis not present

## 2017-07-09 DIAGNOSIS — M47812 Spondylosis without myelopathy or radiculopathy, cervical region: Secondary | ICD-10-CM | POA: Diagnosis not present

## 2017-07-09 DIAGNOSIS — M9901 Segmental and somatic dysfunction of cervical region: Secondary | ICD-10-CM | POA: Diagnosis not present

## 2017-07-09 DIAGNOSIS — M9903 Segmental and somatic dysfunction of lumbar region: Secondary | ICD-10-CM | POA: Diagnosis not present

## 2017-07-09 DIAGNOSIS — M546 Pain in thoracic spine: Secondary | ICD-10-CM | POA: Diagnosis not present

## 2017-07-19 DIAGNOSIS — M9903 Segmental and somatic dysfunction of lumbar region: Secondary | ICD-10-CM | POA: Diagnosis not present

## 2017-07-19 DIAGNOSIS — M9901 Segmental and somatic dysfunction of cervical region: Secondary | ICD-10-CM | POA: Diagnosis not present

## 2017-07-19 DIAGNOSIS — M546 Pain in thoracic spine: Secondary | ICD-10-CM | POA: Diagnosis not present

## 2017-07-19 DIAGNOSIS — M47812 Spondylosis without myelopathy or radiculopathy, cervical region: Secondary | ICD-10-CM | POA: Diagnosis not present

## 2017-07-19 DIAGNOSIS — M9902 Segmental and somatic dysfunction of thoracic region: Secondary | ICD-10-CM | POA: Diagnosis not present

## 2017-07-19 DIAGNOSIS — M47816 Spondylosis without myelopathy or radiculopathy, lumbar region: Secondary | ICD-10-CM | POA: Diagnosis not present

## 2017-07-26 DIAGNOSIS — M47812 Spondylosis without myelopathy or radiculopathy, cervical region: Secondary | ICD-10-CM | POA: Diagnosis not present

## 2017-07-26 DIAGNOSIS — M47816 Spondylosis without myelopathy or radiculopathy, lumbar region: Secondary | ICD-10-CM | POA: Diagnosis not present

## 2017-07-26 DIAGNOSIS — M546 Pain in thoracic spine: Secondary | ICD-10-CM | POA: Diagnosis not present

## 2017-07-26 DIAGNOSIS — M9901 Segmental and somatic dysfunction of cervical region: Secondary | ICD-10-CM | POA: Diagnosis not present

## 2017-07-26 DIAGNOSIS — M9902 Segmental and somatic dysfunction of thoracic region: Secondary | ICD-10-CM | POA: Diagnosis not present

## 2017-07-26 DIAGNOSIS — M9903 Segmental and somatic dysfunction of lumbar region: Secondary | ICD-10-CM | POA: Diagnosis not present

## 2017-07-30 DIAGNOSIS — M9903 Segmental and somatic dysfunction of lumbar region: Secondary | ICD-10-CM | POA: Diagnosis not present

## 2017-07-30 DIAGNOSIS — M546 Pain in thoracic spine: Secondary | ICD-10-CM | POA: Diagnosis not present

## 2017-07-30 DIAGNOSIS — M47816 Spondylosis without myelopathy or radiculopathy, lumbar region: Secondary | ICD-10-CM | POA: Diagnosis not present

## 2017-07-30 DIAGNOSIS — M47812 Spondylosis without myelopathy or radiculopathy, cervical region: Secondary | ICD-10-CM | POA: Diagnosis not present

## 2017-07-30 DIAGNOSIS — M9902 Segmental and somatic dysfunction of thoracic region: Secondary | ICD-10-CM | POA: Diagnosis not present

## 2017-07-30 DIAGNOSIS — M9901 Segmental and somatic dysfunction of cervical region: Secondary | ICD-10-CM | POA: Diagnosis not present

## 2017-08-02 DIAGNOSIS — Z8546 Personal history of malignant neoplasm of prostate: Secondary | ICD-10-CM | POA: Diagnosis not present

## 2017-08-14 DIAGNOSIS — M47816 Spondylosis without myelopathy or radiculopathy, lumbar region: Secondary | ICD-10-CM | POA: Diagnosis not present

## 2017-08-14 DIAGNOSIS — M546 Pain in thoracic spine: Secondary | ICD-10-CM | POA: Diagnosis not present

## 2017-08-14 DIAGNOSIS — M9902 Segmental and somatic dysfunction of thoracic region: Secondary | ICD-10-CM | POA: Diagnosis not present

## 2017-08-14 DIAGNOSIS — M9903 Segmental and somatic dysfunction of lumbar region: Secondary | ICD-10-CM | POA: Diagnosis not present

## 2017-08-14 DIAGNOSIS — M47812 Spondylosis without myelopathy or radiculopathy, cervical region: Secondary | ICD-10-CM | POA: Diagnosis not present

## 2017-08-14 DIAGNOSIS — M9901 Segmental and somatic dysfunction of cervical region: Secondary | ICD-10-CM | POA: Diagnosis not present

## 2017-08-24 ENCOUNTER — Encounter: Payer: Self-pay | Admitting: Family Medicine

## 2017-08-24 ENCOUNTER — Ambulatory Visit (INDEPENDENT_AMBULATORY_CARE_PROVIDER_SITE_OTHER): Payer: Medicare HMO | Admitting: Family Medicine

## 2017-08-24 VITALS — BP 118/78 | Ht 68.0 in | Wt 165.2 lb

## 2017-08-24 DIAGNOSIS — M5431 Sciatica, right side: Secondary | ICD-10-CM

## 2017-08-24 MED ORDER — PREDNISONE 20 MG PO TABS: ORAL_TABLET | ORAL | 0 refills | Status: DC

## 2017-08-24 NOTE — Progress Notes (Signed)
   Subjective:    Patient ID: David Pham, male    DOB: June 13, 1951, 67 y.o.   MRN: [id number]  HPI Patient arrives for a follow up on back pain. Low back pain as well as sciatica symptoms into the right leg hurts when he has been walking for several minutes describes aching burning sensation sometimes radiates on the front side of the thigh sometimes into the groin sometimes into the right knee the patient does have a soft tissue growth for which he did ultrasound and a needle aspiration with orthopedics in he wonders if this could be bothering him he denies any weakness or numbness in the leg he states it does seem to happen every time he walks he is reluctant to be on any type of anti-inflammatories because he is worried that he could develop an ulcer  Review of Systems Denies bowel or bladder issues denies rectal issues or diarrhea denies abdominal pain chest pain shortness of breath    Objective:   Physical Exam Lungs clear no respiratory distress heart regular pulse normal BP good Low back mild tenderness in the right lower back tight hamstrings negative straight leg raise reflexes good strength good     Assessment & Plan:  Right leg sciatica-Short course of steroids regular exercises follow-up 6 weeks if not significantly better within 6 weeks next that would be imaging.  Also discussed with patient the possibility referral but he states he does not want to have surgery he is hoping this will get better on its own he knows to notify us if it is getting worse

## 2017-09-07 DIAGNOSIS — M47816 Spondylosis without myelopathy or radiculopathy, lumbar region: Secondary | ICD-10-CM | POA: Diagnosis not present

## 2017-09-07 DIAGNOSIS — M9903 Segmental and somatic dysfunction of lumbar region: Secondary | ICD-10-CM | POA: Diagnosis not present

## 2017-09-07 DIAGNOSIS — M9901 Segmental and somatic dysfunction of cervical region: Secondary | ICD-10-CM | POA: Diagnosis not present

## 2017-09-07 DIAGNOSIS — M47812 Spondylosis without myelopathy or radiculopathy, cervical region: Secondary | ICD-10-CM | POA: Diagnosis not present

## 2017-09-07 DIAGNOSIS — M9902 Segmental and somatic dysfunction of thoracic region: Secondary | ICD-10-CM | POA: Diagnosis not present

## 2017-09-07 DIAGNOSIS — M546 Pain in thoracic spine: Secondary | ICD-10-CM | POA: Diagnosis not present

## 2017-09-14 DIAGNOSIS — M9903 Segmental and somatic dysfunction of lumbar region: Secondary | ICD-10-CM | POA: Diagnosis not present

## 2017-09-14 DIAGNOSIS — M47816 Spondylosis without myelopathy or radiculopathy, lumbar region: Secondary | ICD-10-CM | POA: Diagnosis not present

## 2017-09-14 DIAGNOSIS — M47812 Spondylosis without myelopathy or radiculopathy, cervical region: Secondary | ICD-10-CM | POA: Diagnosis not present

## 2017-09-14 DIAGNOSIS — M9901 Segmental and somatic dysfunction of cervical region: Secondary | ICD-10-CM | POA: Diagnosis not present

## 2017-09-14 DIAGNOSIS — M546 Pain in thoracic spine: Secondary | ICD-10-CM | POA: Diagnosis not present

## 2017-09-14 DIAGNOSIS — M9902 Segmental and somatic dysfunction of thoracic region: Secondary | ICD-10-CM | POA: Diagnosis not present

## 2017-09-21 DIAGNOSIS — R69 Illness, unspecified: Secondary | ICD-10-CM | POA: Diagnosis not present

## 2017-10-03 DIAGNOSIS — M5136 Other intervertebral disc degeneration, lumbar region: Secondary | ICD-10-CM | POA: Diagnosis not present

## 2017-10-03 DIAGNOSIS — M5416 Radiculopathy, lumbar region: Secondary | ICD-10-CM | POA: Diagnosis not present

## 2017-10-03 DIAGNOSIS — M5380 Other specified dorsopathies, site unspecified: Secondary | ICD-10-CM | POA: Diagnosis not present

## 2017-10-04 ENCOUNTER — Ambulatory Visit: Payer: Medicare HMO | Admitting: Family Medicine

## 2017-10-09 ENCOUNTER — Encounter: Payer: Self-pay | Admitting: Family Medicine

## 2017-10-09 ENCOUNTER — Ambulatory Visit (INDEPENDENT_AMBULATORY_CARE_PROVIDER_SITE_OTHER): Payer: Medicare HMO | Admitting: Family Medicine

## 2017-10-09 VITALS — BP 136/86 | Ht 69.0 in | Wt 165.4 lb

## 2017-10-09 DIAGNOSIS — I10 Essential (primary) hypertension: Secondary | ICD-10-CM

## 2017-10-09 DIAGNOSIS — M5431 Sciatica, right side: Secondary | ICD-10-CM

## 2017-10-09 NOTE — Progress Notes (Signed)
   Subjective:    Patient ID: David Pham, male    DOB: 1950/07/28, 67 y.o.   MRN: [id number]  HPI Patient arrives for a follow up on back pain and sciatica. Significant pain right lower back some pain down the leg this been getting somewhat better compared to where it has been doing stretching exercises patient also concerned about his blood pressure states blood pressure overall doing fairly well but sometimes is higher than what he would like to see if by his readings at home are typically 114/70 denies any chest tightness pressure pain  Review of Systems  Constitutional: Negative for activity change, fatigue and fever.  HENT: Negative for congestion and rhinorrhea.   Respiratory: Negative for cough and shortness of breath.   Cardiovascular: Negative for chest pain and leg swelling.  Gastrointestinal: Negative for abdominal pain, diarrhea and nausea.  Genitourinary: Negative for dysuria and hematuria.  Neurological: Negative for weakness and headaches.  Psychiatric/Behavioral: Negative for behavioral problems.       Objective:   Physical Exam Respiratory rate normal heart regular no murmurs no crackles pulses are normal extremities no edema neurologic grossly normal low back subjective tenderness on the right side negative straight leg raise no weakness   Blood pressure was checked multiple times 5 minutes apart best reading 122/86    Assessment & Plan:  Patient with low back pain discomfort radiating into the right leg intermittently he has an appointment for an MRI coming up with a follow-up appointment with Dr. Herma Mering to discuss possibility of injections hopefully will not need to have surgery  Blood pressure within reasonable limits here at the office it is better at home I would not recommend increasing dose of medication

## 2017-10-25 DIAGNOSIS — M5136 Other intervertebral disc degeneration, lumbar region: Secondary | ICD-10-CM | POA: Diagnosis not present

## 2017-10-30 ENCOUNTER — Other Ambulatory Visit: Payer: Self-pay | Admitting: Family Medicine

## 2017-11-07 ENCOUNTER — Encounter: Payer: Medicare HMO | Admitting: Family Medicine

## 2017-11-09 ENCOUNTER — Encounter: Payer: Self-pay | Admitting: Family Medicine

## 2017-11-09 ENCOUNTER — Ambulatory Visit (INDEPENDENT_AMBULATORY_CARE_PROVIDER_SITE_OTHER): Payer: Medicare HMO | Admitting: Family Medicine

## 2017-11-09 VITALS — BP 130/76 | Ht 69.0 in | Wt 165.0 lb

## 2017-11-09 DIAGNOSIS — M545 Low back pain, unspecified: Secondary | ICD-10-CM

## 2017-11-09 DIAGNOSIS — Z8546 Personal history of malignant neoplasm of prostate: Secondary | ICD-10-CM

## 2017-11-09 DIAGNOSIS — I10 Essential (primary) hypertension: Secondary | ICD-10-CM

## 2017-11-09 DIAGNOSIS — E785 Hyperlipidemia, unspecified: Secondary | ICD-10-CM

## 2017-11-09 DIAGNOSIS — Z0000 Encounter for general adult medical examination without abnormal findings: Secondary | ICD-10-CM

## 2017-11-09 MED ORDER — AMLODIPINE BESYLATE 2.5 MG PO TABS: 2.5000 mg | ORAL_TABLET | Freq: Every day | ORAL | 1 refills | Status: DC

## 2017-11-09 MED ORDER — ZOSTER VAC RECOMB ADJUVANTED 50 MCG/0.5ML IM SUSR: 0.5000 mL | Freq: Once | INTRAMUSCULAR | 0 refills | Status: AC

## 2017-11-09 NOTE — Progress Notes (Signed)
   Subjective:    Patient ID: David Pham, male    DOB: 11-10-1950, 67 y.o.   MRN: [id number]  HPI AWV- Annual Wellness Visit  The patient was seen for their annual wellness visit. The patient's past medical history, surgical history, and family history were reviewed. Pertinent vaccines were reviewed ( tetanus, pneumonia, shingles, flu) The patient's medication list was reviewed and updated.  The height and weight were entered.  BMI recorded in electronic record elsewhere  Cognitive screening was completed. Outcome of Mini - Cog: pass   Falls /depression screening electronically recorded within record elsewhere  Current tobacco usage: yes (All patients who use tobacco were given written and verbal information on quitting)  Recent listing of emergency department/hospitalizations over the past year were reviewed.  current specialist the patient sees on a regular basis: chiropractor    Medicare annual wellness visit patient questionnaire was reviewed.  A written screening schedule for the patient for the next 5-10 years was given. Appropriate discussion of followup regarding next visit was discussed.      Review of Systems  Constitutional: Negative for activity change, appetite change and fever.  HENT: Negative for congestion and rhinorrhea.   Eyes: Negative for discharge.  Respiratory: Negative for cough and wheezing.   Cardiovascular: Negative for chest pain.  Gastrointestinal: Negative for abdominal pain, blood in stool and vomiting.  Genitourinary: Negative for difficulty urinating and frequency.  Musculoskeletal: Negative for neck pain.  Skin: Negative for rash.  Allergic/Immunologic: Negative for environmental allergies and food allergies.  Neurological: Negative for weakness and headaches.  Psychiatric/Behavioral: Negative for agitation.       Objective:   Physical Exam  Constitutional: He appears well-developed and well-nourished.  HENT:  Head:  Normocephalic and atraumatic.  Right Ear: External ear normal.  Left Ear: External ear normal.  Nose: Nose normal.  Mouth/Throat: Oropharynx is clear and moist.  Eyes: Right eye exhibits no discharge. Left eye exhibits no discharge. No scleral icterus.  Neck: Normal range of motion. Neck supple. No thyromegaly present.  Cardiovascular: Normal rate, regular rhythm and normal heart sounds.  No murmur heard. Pulmonary/Chest: Effort normal and breath sounds normal. No respiratory distress. He has no wheezes.  Abdominal: Soft. Bowel sounds are normal. He exhibits no distension and no mass. There is no tenderness.  Genitourinary: Penis normal.  Musculoskeletal: Normal range of motion. He exhibits no edema.  Lymphadenopathy:    He has no cervical adenopathy.  Neurological: He is alert. He exhibits normal muscle tone. Coordination normal.  Skin: Skin is warm and dry. No erythema.  Psychiatric: He has a normal mood and affect. His behavior is normal. Judgment normal.     Patient with chronic low back pain being followed by specialist and they are planning on doing some injections and if that does not work they will plan on doing an MRI     Assessment & Plan:  Lab work ordered Previous labs reviewed Blood pressure good control continue current measures Prostate cancer in remission patient is being followed by specialist Adult wellness-complete.wellness physical was conducted today. Importance of diet and exercise were discussed in detail.  In addition to this a discussion regarding safety was also covered. We also reviewed over immunizations and gave recommendations regarding current immunization needed for age.  In addition to this additional areas were also touched on including: Preventative health exams needed: Colonoscopy up-to-date  Patient was advised yearly wellness exam

## 2017-11-15 DIAGNOSIS — M5126 Other intervertebral disc displacement, lumbar region: Secondary | ICD-10-CM | POA: Diagnosis not present

## 2017-11-15 DIAGNOSIS — M5416 Radiculopathy, lumbar region: Secondary | ICD-10-CM | POA: Diagnosis not present

## 2017-11-17 DIAGNOSIS — Z0000 Encounter for general adult medical examination without abnormal findings: Secondary | ICD-10-CM | POA: Diagnosis not present

## 2017-11-17 DIAGNOSIS — I10 Essential (primary) hypertension: Secondary | ICD-10-CM | POA: Diagnosis not present

## 2017-11-17 DIAGNOSIS — E785 Hyperlipidemia, unspecified: Secondary | ICD-10-CM | POA: Diagnosis not present

## 2017-11-17 DIAGNOSIS — Z8546 Personal history of malignant neoplasm of prostate: Secondary | ICD-10-CM | POA: Diagnosis not present

## 2017-11-18 LAB — BASIC METABOLIC PANEL
BUN / CREAT RATIO: 14 (ref 10–24)
BUN: 13 mg/dL (ref 8–27)
CO2: 27 mmol/L (ref 20–29)
Calcium: 9.1 mg/dL (ref 8.6–10.2)
Chloride: 106 mmol/L (ref 96–106)
Creatinine, Ser: 0.91 mg/dL (ref 0.76–1.27)
GFR calc Af Amer: 100 mL/min/{1.73_m2} (ref >59)
GFR, EST NON AFRICAN AMERICAN: 87 mL/min/{1.73_m2} (ref >59)
GLUCOSE: 81 mg/dL (ref 65–99)
Potassium: 3.6 mmol/L (ref 3.5–5.2)
SODIUM: 144 mmol/L (ref 134–144)

## 2017-11-18 LAB — CBC WITH DIFFERENTIAL/PLATELET
BASOS: 0 %
Basophils Absolute: 0 10*3/uL (ref 0.0–0.2)
EOS (ABSOLUTE): 0 10*3/uL (ref 0.0–0.4)
Eos: 0 %
HEMATOCRIT: 40.6 % (ref 37.5–51.0)
Hemoglobin: 13.8 g/dL (ref 13.0–17.7)
Immature Grans (Abs): 0 10*3/uL (ref 0.0–0.1)
Immature Granulocytes: 0 %
LYMPHS ABS: 5.9 10*3/uL — AB (ref 0.7–3.1)
Lymphs: 50 %
MCH: 30.7 pg (ref 26.6–33.0)
MCHC: 34 g/dL (ref 31.5–35.7)
MCV: 90 fL (ref 79–97)
MONOS ABS: 0.5 10*3/uL (ref 0.1–0.9)
Monocytes: 4 %
Neutrophils Absolute: 5.5 10*3/uL (ref 1.4–7.0)
Neutrophils: 46 %
Platelets: 228 10*3/uL (ref 150–379)
RBC: 4.5 x10E6/uL (ref 4.14–5.80)
RDW: 13.8 % (ref 12.3–15.4)
WBC: 11.9 10*3/uL — AB (ref 3.4–10.8)

## 2017-11-18 LAB — HEPATIC FUNCTION PANEL
ALT: 19 IU/L (ref 0–44)
AST: 21 IU/L (ref 0–40)
Albumin: 4.4 g/dL (ref 3.6–4.8)
Alkaline Phosphatase: 67 IU/L (ref 39–117)
Bilirubin Total: 0.5 mg/dL (ref 0.0–1.2)
Bilirubin, Direct: 0.14 mg/dL (ref 0.00–0.40)
Total Protein: 6.5 g/dL (ref 6.0–8.5)

## 2017-11-18 LAB — LIPID PANEL
CHOLESTEROL TOTAL: 172 mg/dL (ref 100–199)
Chol/HDL Ratio: 3.9 ratio (ref 0.0–5.0)
HDL: 44 mg/dL (ref >39)
LDL Calculated: 108 mg/dL — ABNORMAL HIGH (ref 0–99)
TRIGLYCERIDES: 100 mg/dL (ref 0–149)
VLDL Cholesterol Cal: 20 mg/dL (ref 5–40)

## 2017-11-30 DIAGNOSIS — M5416 Radiculopathy, lumbar region: Secondary | ICD-10-CM | POA: Diagnosis not present

## 2017-12-01 DIAGNOSIS — H5213 Myopia, bilateral: Secondary | ICD-10-CM | POA: Diagnosis not present

## 2017-12-01 DIAGNOSIS — H524 Presbyopia: Secondary | ICD-10-CM | POA: Diagnosis not present

## 2017-12-01 DIAGNOSIS — H5203 Hypermetropia, bilateral: Secondary | ICD-10-CM | POA: Diagnosis not present

## 2017-12-01 DIAGNOSIS — H2510 Age-related nuclear cataract, unspecified eye: Secondary | ICD-10-CM | POA: Diagnosis not present

## 2017-12-01 DIAGNOSIS — H52223 Regular astigmatism, bilateral: Secondary | ICD-10-CM | POA: Diagnosis not present

## 2017-12-01 DIAGNOSIS — E119 Type 2 diabetes mellitus without complications: Secondary | ICD-10-CM | POA: Diagnosis not present

## 2017-12-13 DIAGNOSIS — M5416 Radiculopathy, lumbar region: Secondary | ICD-10-CM | POA: Diagnosis not present

## 2017-12-14 ENCOUNTER — Ambulatory Visit (INDEPENDENT_AMBULATORY_CARE_PROVIDER_SITE_OTHER): Payer: Medicare HMO | Admitting: Family Medicine

## 2017-12-14 ENCOUNTER — Encounter: Payer: Self-pay | Admitting: Family Medicine

## 2017-12-14 VITALS — BP 114/72 | Ht 69.0 in | Wt 156.0 lb

## 2017-12-14 DIAGNOSIS — D72829 Elevated white blood cell count, unspecified: Secondary | ICD-10-CM

## 2017-12-14 DIAGNOSIS — E785 Hyperlipidemia, unspecified: Secondary | ICD-10-CM | POA: Diagnosis not present

## 2017-12-14 NOTE — Patient Instructions (Addendum)
Cholesterol Cholesterol is a white, waxy, fat-like substance that is needed by the human body in small amounts. The liver makes all the cholesterol we need. Cholesterol is carried from the liver by the blood through the blood vessels. Deposits of cholesterol (plaques) may build up on blood vessel (artery) walls. Plaques make the arteries narrower and stiffer. Cholesterol plaques increase the risk for heart attack and stroke. You cannot feel your cholesterol level even if it is very high. The only way to know that it is high is to have a blood test. Once you know your cholesterol levels, you should keep a record of the test results. Work with your health care provider to keep your levels in the desired range. What do the results mean?  Total cholesterol is a rough measure of all the cholesterol in your blood.  LDL (low-density lipoprotein) is the "bad" cholesterol. This is the type that causes plaque to build up on the artery walls. You want this level to be low.  HDL (high-density lipoprotein) is the "good" cholesterol because it cleans the arteries and carries the LDL away. You want this level to be high.  Triglycerides are fat that the body can either burn for energy or store. High levels are closely linked to heart disease. What are the desired levels of cholesterol?  Total cholesterol below 200.  LDL below 100 for people who are at risk, below 70 for people at very high risk.  HDL above 40 is good. A level of 60 or higher is considered to be protective against heart disease.  Triglycerides below 150. How can I lower my cholesterol? Diet Follow your diet program as told by your health care provider.  Choose fish or white meat chicken and [place], roasted or baked. Limit fatty cuts of red meat, fried foods, and processed meats, such as sausage and lunch meats.  Eat lots of fresh fruits and vegetables.  Choose whole grains, beans, pasta, potatoes, and cereals.  Choose olive oil, corn  oil, or canola oil, and use only small amounts.  Avoid butter, mayonnaise, shortening, or palm kernel oils.  Avoid foods with trans fats.  Drink skim or nonfat milk and eat low-fat or nonfat yogurt and cheeses. Avoid whole milk, cream, ice cream, egg yolks, and full-fat cheeses.  Healthier desserts include angel food cake, ginger snaps, animal crackers, hard candy, popsicles, and low-fat or nonfat frozen yogurt. Avoid pastries, cakes, pies, and cookies.  Exercise  Follow your exercise program as told by your health care provider. A regular program: ? Helps to decrease LDL and raise HDL. ? Helps with weight control.  Do things that increase your activity level, such as gardening, walking, and taking the stairs.  Ask your health care provider about ways that you can be more active in your daily life.  Medicine  Take over-the-counter and prescription medicines only as told by your health care provider. ? Medicine may be prescribed by your health care provider to help lower cholesterol and decrease the risk for heart disease. This is usually done if diet and exercise have failed to bring down cholesterol levels. ? If you have several risk factors, you may need medicine even if your levels are normal.  This information is not intended to replace advice given to you by your health care provider. Make sure you discuss any questions you have with your health care provider. Document Released: 04/04/2001 Document Revised: 02/05/2016 Document Reviewed: 01/08/2016 Elsevier Interactive Patient Education  2018 [employer].  Neck  Exercises Neck exercises can be important for many reasons:  They can help you to improve and maintain flexibility in your neck. This can be especially important as you age.  They can help to make your neck stronger. This can make movement easier.  They can reduce or prevent neck pain.  They may help your upper back.  Ask your health care provider which neck  exercises would be best for you. Exercises Neck Press Repeat this exercise 10 times. Do it first thing in the morning and right before bed or as told by your health care provider. 1. Lie on your back on a firm bed or on the floor with a pillow under your head. 2. Use your neck muscles to push your head down on the pillow and straighten your spine. 3. Hold the position as well as you can. Keep your head facing up and your chin tucked. 4. Slowly count to 5 while holding this position. 5. Relax for a few seconds. Then repeat.  Isometric Strengthening Do a full set of these exercises 2 times a day or as told by your health care provider. 1. Sit in a supportive chair and place your hand on your forehead. 2. Push forward with your head and neck while pushing back with your hand. Hold for 10 seconds. 3. Relax. Then repeat the exercise 3 times. 4. Next, do thesequence again, this time putting your hand against the back of your head. Use your head and neck to push backward against the hand pressure. 5. Finally, do the same exercise on either side of your head, pushing sideways against the pressure of your hand.  Prone Head Lifts Repeat this exercise 5 times. Do this 2 times a day or as told by your health care provider. 1. Lie face-down, resting on your elbows so that your chest and upper back are raised. 2. Start with your head facing downward, near your chest. Position your chin either on or near your chest. 3. Slowly lift your head upward. Lift until you are looking straight ahead. Then continue lifting your head as far back as you can stretch. 4. Hold your head up for 5 seconds. Then slowly lower it to your starting position.  Supine Head Lifts Repeat this exercise 8-10 times. Do this 2 times a day or as told by your health care provider. 1. Lie on your back, bending your knees to point to the ceiling and keeping your feet flat on the floor. 2. Lift your head slowly off the floor, raising  your chin toward your chest. 3. Hold for 5 seconds. 4. Relax and repeat.  Scapular Retraction Repeat this exercise 5 times. Do this 2 times a day or as told by your health care provider. 1. Stand with your arms at your sides. Look straight ahead. 2. Slowly pull both shoulders backward and downward until you feel a stretch between your shoulder blades in your upper back. 3. Hold for 10-30 seconds. 4. Relax and repeat.  Contact a health care provider if:  Your neck pain or discomfort gets much worse when you do an exercise.  Your neck pain or discomfort does not improve within 2 hours after you exercise. If you have any of these problems, stop exercising right away. Do not do the exercises again unless your health care provider says that you can. Get help right away if:  You develop sudden, severe neck pain. If this happens, stop exercising right away. Do not do the exercises again unless your  health care provider says that you can. Exercises Neck Stretch  Repeat this exercise 3-5 times. 1. Do this exercise while standing or while sitting in a chair. 2. Place your feet flat on the floor, shoulder-width apart. 3. Slowly turn your head to the right. Turn it all the way to the right so you can look over your right shoulder. Do not tilt or tip your head. 4. Hold this position for 10-30 seconds. 5. Slowly turn your head to the left, to look over your left shoulder. 6. Hold this position for 10-30 seconds.  Neck Retraction Repeat this exercise 8-10 times. Do this 3-4 times a day or as told by your health care provider. 1. Do this exercise while standing or while sitting in a sturdy chair. 2. Look straight ahead. Do not bend your neck. 3. Use your fingers to push your chin backward. Do not bend your neck for this movement. Continue to face straight ahead. If you are doing the exercise properly, you will feel a slight sensation in your throat and a stretch at the back of your neck. 4. Hold  the stretch for 1-2 seconds. Relax and repeat.  This information is not intended to replace advice given to you by your health care provider. Make sure you discuss any questions you have with your health care provider. Document Released: 06/21/2015 Document Revised: 12/16/2015 Document Reviewed: 01/18/2015 Elsevier Interactive Patient Education  [employer].

## 2017-12-14 NOTE — Progress Notes (Signed)
   Subjective:    Patient ID: David Pham, male    DOB: 1951-05-03, 67 y.o.   MRN: [id number]  HPIfollow up on lab results. Elevated cholesterol.  Very nice gentleman 20 minutes spent with the patient discussing his cholesterol discussing risk factors discussing treatment also spent discussing slight elevation of white blood count he is not having fevers weight loss chills no illness of any sort he does have chronic low back pain seeing orthopedics for injections may have to get surgery   Had lumbar injection at [facility] yesterday   Review of Systems  Constitutional: Negative for activity change, fatigue and fever.  HENT: Negative for congestion and rhinorrhea.   Respiratory: Negative for cough and shortness of breath.   Cardiovascular: Negative for chest pain and leg swelling.  Gastrointestinal: Negative for abdominal pain, diarrhea and nausea.  Genitourinary: Negative for dysuria and hematuria.  Neurological: Negative for weakness and headaches.  Psychiatric/Behavioral: Negative for behavioral problems.       Objective:   Physical Exam  Constitutional: He appears well-nourished. No distress.  Cardiovascular: Normal rate, regular rhythm and normal heart sounds.  No murmur heard. Pulmonary/Chest: Effort normal and breath sounds normal. No respiratory distress.  Musculoskeletal: He exhibits no edema.  Lymphadenopathy:    He has no cervical adenopathy.  Neurological: He is alert.  Psychiatric: His behavior is normal.  Vitals reviewed.         Assessment & Plan:  Leukocytosis-minimal recheck CBC in 3 months  Hyperlipidemia patient wants to work hard on diet he will recheck this again in 3 months there is possibility we will need to start a statin depending on the results  Healthy diet regular physical activity recommended  Patient getting spinal injections this is helping some but if it does not help enough he is contemplating surgery  He does have a intermittent  catch in the room right side of his neck but is not severe no intervention necessary currently

## 2017-12-28 ENCOUNTER — Encounter: Payer: Self-pay | Admitting: Family Medicine

## 2017-12-28 DIAGNOSIS — M5416 Radiculopathy, lumbar region: Secondary | ICD-10-CM | POA: Diagnosis not present

## 2018-01-01 DIAGNOSIS — M48061 Spinal stenosis, lumbar region without neurogenic claudication: Secondary | ICD-10-CM | POA: Diagnosis not present

## 2018-01-21 DIAGNOSIS — M9902 Segmental and somatic dysfunction of thoracic region: Secondary | ICD-10-CM | POA: Diagnosis not present

## 2018-01-21 DIAGNOSIS — M47812 Spondylosis without myelopathy or radiculopathy, cervical region: Secondary | ICD-10-CM | POA: Diagnosis not present

## 2018-01-21 DIAGNOSIS — M546 Pain in thoracic spine: Secondary | ICD-10-CM | POA: Diagnosis not present

## 2018-01-21 DIAGNOSIS — M9901 Segmental and somatic dysfunction of cervical region: Secondary | ICD-10-CM | POA: Diagnosis not present

## 2018-01-21 DIAGNOSIS — M9903 Segmental and somatic dysfunction of lumbar region: Secondary | ICD-10-CM | POA: Diagnosis not present

## 2018-01-21 DIAGNOSIS — M47816 Spondylosis without myelopathy or radiculopathy, lumbar region: Secondary | ICD-10-CM | POA: Diagnosis not present

## 2018-02-08 ENCOUNTER — Ambulatory Visit (HOSPITAL_COMMUNITY)
Admission: RE | Admit: 2018-02-08 | Discharge: 2018-02-08 | Disposition: A | Discharge status: Home / Self Care | Payer: Medicare HMO | Source: Ambulatory Visit | Attending: Family Medicine | Admitting: Family Medicine

## 2018-02-08 ENCOUNTER — Encounter: Payer: Self-pay | Admitting: Family Medicine

## 2018-02-08 ENCOUNTER — Ambulatory Visit (INDEPENDENT_AMBULATORY_CARE_PROVIDER_SITE_OTHER): Payer: Medicare HMO | Admitting: Family Medicine

## 2018-02-08 VITALS — BP 128/82 | Ht 69.0 in | Wt 154.6 lb

## 2018-02-08 DIAGNOSIS — M5030 Other cervical disc degeneration, unspecified cervical region: Secondary | ICD-10-CM | POA: Insufficient documentation

## 2018-02-08 DIAGNOSIS — M542 Cervicalgia: Secondary | ICD-10-CM | POA: Diagnosis not present

## 2018-02-08 DIAGNOSIS — I998 Other disorder of circulatory system: Secondary | ICD-10-CM | POA: Diagnosis not present

## 2018-02-08 DIAGNOSIS — I10 Essential (primary) hypertension: Secondary | ICD-10-CM | POA: Diagnosis not present

## 2018-02-08 NOTE — Progress Notes (Signed)
   Subjective:    Patient ID: David Pham, male    DOB: 07/20/1951, 67 y.o.   MRN: [id number]  HPI  Patient arrives to discuss neck and shoulder pain.   Radiates into the right trapezius does not radiate to the arm-patient has had this problem ongoing for several weeks has tried various medications without much success does not know of any injury to it.  Does not cause weakness.  Has ongoing lumbar pain states he is tried a couple different injections which seem to help some he is done a little bit of exercises he states he might consider surgery  Patient for blood pressure check up.  The patient does have hypertension.  The patient is on medication.  Patient relates compliance with meds. Todays BP reviewed with the patient. Patient denies issues with medication. Patient relates reasonable diet. Patient tries to minimize salt. Patient aware of BP goals.   Review of Systems  Constitutional: Negative for activity change, appetite change and fatigue.  HENT: Negative for congestion and rhinorrhea.   Respiratory: Negative for cough, chest tightness and shortness of breath.   Cardiovascular: Negative for chest pain and leg swelling.  Gastrointestinal: Negative for abdominal pain, diarrhea and nausea.  Endocrine: Negative for polydipsia and polyphagia.  Genitourinary: Negative for dysuria and hematuria.  Musculoskeletal: Positive for arthralgias and back pain.  Neurological: Negative for weakness and headaches.  Psychiatric/Behavioral: Negative for confusion and dysphoric mood.       Objective:   Physical Exam  Constitutional: He appears well-nourished. No distress.  Cardiovascular: Normal rate, regular rhythm and normal heart sounds.  No murmur heard. Pulmonary/Chest: Effort normal and breath sounds normal. No respiratory distress.  Musculoskeletal: He exhibits no edema.  Lymphadenopathy:    He has no cervical adenopathy.  Neurological: He is alert.  Psychiatric: His behavior is  normal.  Vitals reviewed.         Assessment & Plan:  HTN- Patient was seen today as part of a visit regarding hypertension. The importance of healthy diet and regular physical activity was discussed. The importance of compliance with medications discussed.  Ideal goal is to keep blood pressure low elevated levels certainly below 683/41 when possible.  The patient was counseled that keeping blood pressure under control lessen his risk of complications.  The importance of regular follow-ups was discussed with the patient.  Low-salt diet such as DASH recommended.  Regular physical activity was recommended as well.  Patient was advised to keep regular follow-ups.  Cervical neck pain recommend x-rays await the results of this.  Recommend gentle stretching exercises sheets were given  Ongoing low back pain is had some injections if it keeps bothering him he is going to get at least one opinion regarding surgical options he will let us know  Follow-up in the fall

## 2018-02-13 DIAGNOSIS — R69 Illness, unspecified: Secondary | ICD-10-CM | POA: Diagnosis not present

## 2018-02-14 ENCOUNTER — Other Ambulatory Visit: Payer: Self-pay | Admitting: Family Medicine

## 2018-02-14 DIAGNOSIS — I6529 Occlusion and stenosis of unspecified carotid artery: Secondary | ICD-10-CM

## 2018-02-22 ENCOUNTER — Ambulatory Visit (HOSPITAL_COMMUNITY)
Admission: RE | Admit: 2018-02-22 | Discharge: 2018-02-22 | Disposition: A | Discharge status: Home / Self Care | Payer: Medicare HMO | Source: Ambulatory Visit | Attending: Family Medicine | Admitting: Family Medicine

## 2018-02-22 DIAGNOSIS — I6523 Occlusion and stenosis of bilateral carotid arteries: Secondary | ICD-10-CM | POA: Diagnosis not present

## 2018-02-22 DIAGNOSIS — Z8679 Personal history of other diseases of the circulatory system: Secondary | ICD-10-CM | POA: Diagnosis not present

## 2018-02-22 DIAGNOSIS — D72829 Elevated white blood cell count, unspecified: Secondary | ICD-10-CM | POA: Diagnosis not present

## 2018-02-22 DIAGNOSIS — E785 Hyperlipidemia, unspecified: Secondary | ICD-10-CM | POA: Diagnosis not present

## 2018-02-22 DIAGNOSIS — I6529 Occlusion and stenosis of unspecified carotid artery: Secondary | ICD-10-CM | POA: Diagnosis present

## 2018-02-23 LAB — CBC WITH DIFFERENTIAL/PLATELET
BASOS: 0 %
Basophils Absolute: 0 10*3/uL (ref 0.0–0.2)
EOS (ABSOLUTE): 0.1 10*3/uL (ref 0.0–0.4)
EOS: 1 %
HEMATOCRIT: 41.7 % (ref 37.5–51.0)
Hemoglobin: 14.3 g/dL (ref 13.0–17.7)
Immature Grans (Abs): 0 10*3/uL (ref 0.0–0.1)
Immature Granulocytes: 0 %
LYMPHS ABS: 4.6 10*3/uL — AB (ref 0.7–3.1)
Lymphs: 59 %
MCH: 30.6 pg (ref 26.6–33.0)
MCHC: 34.3 g/dL (ref 31.5–35.7)
MCV: 89 fL (ref 79–97)
MONOS ABS: 0.4 10*3/uL (ref 0.1–0.9)
Monocytes: 6 %
NEUTROS ABS: 2.6 10*3/uL (ref 1.4–7.0)
Neutrophils: 34 %
Platelets: 207 10*3/uL (ref 150–450)
RBC: 4.67 x10E6/uL (ref 4.14–5.80)
RDW: 13.8 % (ref 12.3–15.4)
WBC: 7.7 10*3/uL (ref 3.4–10.8)

## 2018-02-23 LAB — LIPID PANEL
Chol/HDL Ratio: 4 ratio (ref 0.0–5.0)
Cholesterol, Total: 166 mg/dL (ref 100–199)
HDL: 42 mg/dL (ref >39)
LDL Calculated: 103 mg/dL — ABNORMAL HIGH (ref 0–99)
Triglycerides: 104 mg/dL (ref 0–149)
VLDL Cholesterol Cal: 21 mg/dL (ref 5–40)

## 2018-03-01 ENCOUNTER — Ambulatory Visit (INDEPENDENT_AMBULATORY_CARE_PROVIDER_SITE_OTHER): Payer: Medicare HMO | Admitting: Family Medicine

## 2018-03-01 ENCOUNTER — Encounter: Payer: Self-pay | Admitting: Family Medicine

## 2018-03-01 VITALS — BP 116/66 | Ht 69.0 in | Wt 156.6 lb

## 2018-03-01 DIAGNOSIS — M545 Low back pain, unspecified: Secondary | ICD-10-CM

## 2018-03-01 DIAGNOSIS — I10 Essential (primary) hypertension: Secondary | ICD-10-CM | POA: Diagnosis not present

## 2018-03-01 MED ORDER — OMEPRAZOLE 20 MG PO CPDR: 20.0000 mg | DELAYED_RELEASE_CAPSULE | Freq: Every day | ORAL | 1 refills | Status: DC

## 2018-03-01 MED ORDER — AMLODIPINE BESYLATE 2.5 MG PO TABS: 2.5000 mg | ORAL_TABLET | Freq: Every day | ORAL | 1 refills | Status: DC

## 2018-03-01 NOTE — Progress Notes (Signed)
   Subjective:    Patient ID: David Pham, male    DOB: October 04, 1950, 67 y.o.   MRN: [id number]  Hyperlipidemia  This is a chronic problem. Pertinent negatives include no chest pain or shortness of breath. There are no compliance problems.   Patient does try to watch I eats He does try to exercise He does not smoke or drink The patient also did have elevated white blood count but we repeated the test and it has been coming down  Results for orders placed or performed in visit on 12/14/17  CBC with Differential/Platelet  Result Value Ref Range   WBC 7.7 3.4 - 10.8 x10E3/uL   RBC 4.67 4.14 - 5.80 x10E6/uL   Hemoglobin 14.3 13.0 - 17.7 g/dL   Hematocrit 41.7 37.5 - 51.0 %   MCV 89 79 - 97 fL   MCH 30.6 26.6 - 33.0 pg   MCHC 34.3 31.5 - 35.7 g/dL   RDW 13.8 12.3 - 15.4 %   Platelets 207 150 - 450 x10E3/uL   Neutrophils 34 Not Estab. %   Lymphs 59 Not Estab. %   Monocytes 6 Not Estab. %   Eos 1 Not Estab. %   Basos 0 Not Estab. %   Neutrophils Absolute 2.6 1.4 - 7.0 x10E3/uL   Lymphocytes Absolute 4.6 (H) 0.7 - 3.1 x10E3/uL   Monocytes Absolute 0.4 0.1 - 0.9 x10E3/uL   EOS (ABSOLUTE) 0.1 0.0 - 0.4 x10E3/uL   Basophils Absolute 0.0 0.0 - 0.2 x10E3/uL   Immature Granulocytes 0 Not Estab. %   Immature Grans (Abs) 0.0 0.0 - 0.1 x10E3/uL  Lipid panel  Result Value Ref Range   Cholesterol, Total 166 100 - 199 mg/dL   Triglycerides 104 0 - 149 mg/dL   HDL 42 >39 mg/dL   VLDL Cholesterol Cal 21 5 - 40 mg/dL   LDL Calculated 103 (H) 0 - 99 mg/dL   Chol/HDL Ratio 4.0 0.0 - 5.0 ratio    Review of Systems  Constitutional: Negative for activity change, appetite change and fatigue.  HENT: Negative for congestion and rhinorrhea.   Respiratory: Negative for cough and shortness of breath.   Cardiovascular: Negative for chest pain and leg swelling.  Gastrointestinal: Negative for abdominal pain, nausea and vomiting.  Neurological: Negative for dizziness and headaches.    Psychiatric/Behavioral: Negative for agitation and behavioral problems.       Objective:   Physical Exam  Constitutional: He appears well-nourished. No distress.  HENT:  Head: Normocephalic and atraumatic.  Eyes: Right eye exhibits no discharge. Left eye exhibits no discharge.  Neck: No tracheal deviation present.  Cardiovascular: Normal rate, regular rhythm and normal heart sounds.  No murmur heard. Pulmonary/Chest: Effort normal and breath sounds normal. No respiratory distress. He has no wheezes.  Musculoskeletal: He exhibits no edema.  Lymphadenopathy:    He has no cervical adenopathy.  Neurological: He is alert.  Skin: Skin is warm. No rash noted.  Psychiatric: His behavior is normal.  Vitals reviewed.  Patient had recent carotid studies done which showed minimal buildup The back pain does not wake him up at night      Assessment & Plan:  Cervical neck pain-continue symptomatic care stretching exercises recommended   low back pain stretching exercises shown information given continue current measures no other intervention necessary currently  Carotid ultrasounds overall look very good no other additional interventions necessary  Blood pressure overall very good  Follow-up within 6 months wellness exam early next year

## 2018-03-01 NOTE — Patient Instructions (Signed)
Results for orders placed or performed in visit on 12/14/17  CBC with Differential/Platelet  Result Value Ref Range   WBC 7.7 3.4 - 10.8 x10E3/uL   RBC 4.67 4.14 - 5.80 x10E6/uL   Hemoglobin 14.3 13.0 - 17.7 g/dL   Hematocrit 41.7 37.5 - 51.0 %   MCV 89 79 - 97 fL   MCH 30.6 26.6 - 33.0 pg   MCHC 34.3 31.5 - 35.7 g/dL   RDW 13.8 12.3 - 15.4 %   Platelets 207 150 - 450 x10E3/uL   Neutrophils 34 Not Estab. %   Lymphs 59 Not Estab. %   Monocytes 6 Not Estab. %   Eos 1 Not Estab. %   Basos 0 Not Estab. %   Neutrophils Absolute 2.6 1.4 - 7.0 x10E3/uL   Lymphocytes Absolute 4.6 (H) 0.7 - 3.1 x10E3/uL   Monocytes Absolute 0.4 0.1 - 0.9 x10E3/uL   EOS (ABSOLUTE) 0.1 0.0 - 0.4 x10E3/uL   Basophils Absolute 0.0 0.0 - 0.2 x10E3/uL   Immature Granulocytes 0 Not Estab. %   Immature Grans (Abs) 0.0 0.0 - 0.1 x10E3/uL  Lipid panel  Result Value Ref Range   Cholesterol, Total 166 100 - 199 mg/dL   Triglycerides 104 0 - 149 mg/dL   HDL 42 >39 mg/dL   VLDL Cholesterol Cal 21 5 - 40 mg/dL   LDL Calculated 103 (H) 0 - 99 mg/dL   Chol/HDL Ratio 4.0 0.0 - 5.0 ratio

## 2018-03-08 ENCOUNTER — Ambulatory Visit: Payer: Medicare HMO | Admitting: Family Medicine

## 2018-03-21 DIAGNOSIS — R69 Illness, unspecified: Secondary | ICD-10-CM | POA: Diagnosis not present

## 2018-04-08 DIAGNOSIS — R69 Illness, unspecified: Secondary | ICD-10-CM | POA: Diagnosis not present

## 2018-04-30 ENCOUNTER — Encounter: Payer: Self-pay | Admitting: Family Medicine

## 2018-04-30 ENCOUNTER — Ambulatory Visit (INDEPENDENT_AMBULATORY_CARE_PROVIDER_SITE_OTHER): Payer: Medicare HMO | Admitting: Family Medicine

## 2018-04-30 VITALS — BP 134/72 | Temp 98.4°F | Ht 69.0 in | Wt 158.0 lb

## 2018-04-30 DIAGNOSIS — J019 Acute sinusitis, unspecified: Secondary | ICD-10-CM | POA: Diagnosis not present

## 2018-04-30 MED ORDER — AZITHROMYCIN 250 MG PO TABS: ORAL_TABLET | ORAL | 0 refills | Status: DC

## 2018-04-30 NOTE — Progress Notes (Signed)
   Subjective:    Patient ID: David Pham, male    DOB: [birth year], 67 y.o.   MRN: [id number]  Sinusitis  This is a new problem. Episode onset: 2 days. Associated symptoms include congestion, coughing and headaches. Pertinent negatives include no chills or ear pain. Past treatments include nothing.   Patient with significant head congestion drainage coughing up her airway congestion is mainly some bronchial congestion had pneumonia several years ago does not want to have it again is concerned that he might get pneumonia otherwise he is feeling pretty good   Review of Systems  Constitutional: Negative for activity change, chills and fever.  HENT: Positive for congestion and rhinorrhea. Negative for ear pain.   Eyes: Negative for discharge.  Respiratory: Positive for cough. Negative for wheezing.   Cardiovascular: Negative for chest pain.  Gastrointestinal: Negative for nausea and vomiting.  Musculoskeletal: Negative for arthralgias.  Neurological: Positive for headaches.       Objective:   Physical Exam  Constitutional: He appears well-developed.  HENT:  Head: Normocephalic.  Mouth/Throat: Oropharynx is clear and moist. No oropharyngeal exudate.  Neck: Normal range of motion.  Cardiovascular: Normal rate, regular rhythm and normal heart sounds.  No murmur heard. Pulmonary/Chest: Effort normal and breath sounds normal. He has no wheezes.  Lymphadenopathy:    He has no cervical adenopathy.  Neurological: He exhibits normal muscle tone.  Skin: Skin is warm and dry.  Nursing note and vitals reviewed.         Assessment & Plan:  More than likely this is a viral illness currently I told him to give it several days he is very concerned that he will end up with pneumonia I told him his best approach is to work hard at resting over the next couple days and if he starts having high fevers wheezing difficulty breathing or worse to follow-up  The prescription for Z-Pak was given  to the patient to take if things are getting worse later this week there is no need to take the medication currently.  The prescription was mainly given because the patient is fearful that this could rapidly worsen and he would need an antibiotic

## 2018-05-10 ENCOUNTER — Ambulatory Visit: Payer: Medicare HMO | Admitting: Family Medicine

## 2018-05-24 DIAGNOSIS — R69 Illness, unspecified: Secondary | ICD-10-CM | POA: Diagnosis not present

## 2018-06-28 ENCOUNTER — Ambulatory Visit (INDEPENDENT_AMBULATORY_CARE_PROVIDER_SITE_OTHER): Payer: Medicare HMO | Admitting: Family Medicine

## 2018-06-28 ENCOUNTER — Encounter: Payer: Self-pay | Admitting: Family Medicine

## 2018-06-28 VITALS — BP 112/76 | Ht 69.0 in | Wt 158.0 lb

## 2018-06-28 DIAGNOSIS — L821 Other seborrheic keratosis: Secondary | ICD-10-CM | POA: Diagnosis not present

## 2018-06-28 DIAGNOSIS — M549 Dorsalgia, unspecified: Secondary | ICD-10-CM | POA: Diagnosis not present

## 2018-06-28 NOTE — Progress Notes (Signed)
   Subjective:    Patient ID: David Pham, male    DOB: Aug 16, 1950, 67 y.o.   MRN: [id number]  HPI  Patient states he is here due to a pulled muscle on his right shoulder area. Pain has been on going for the last two weeks.He also has a mole on his back that has been itching. Patient states upper back some pain discomfort over the muscle in addition to this has a skin area that he is concerned about itches a lot not bleeding any Denies any other troubles no breathing troubles chest pain. Review of Systems  Constitutional: Negative for activity change, fatigue and fever.  HENT: Negative for congestion and rhinorrhea.   Respiratory: Negative for cough and shortness of breath.   Cardiovascular: Negative for chest pain and leg swelling.  Gastrointestinal: Negative for abdominal pain, diarrhea and nausea.  Genitourinary: Negative for dysuria and hematuria.  Neurological: Negative for weakness and headaches.  Psychiatric/Behavioral: Negative for agitation and behavioral problems.       Objective:   Physical Exam  Constitutional: He appears well-nourished. No distress.  HENT:  Head: Normocephalic and atraumatic.  Eyes: Right eye exhibits no discharge. Left eye exhibits no discharge.  Neck: No tracheal deviation present.  Cardiovascular: Normal rate, regular rhythm and normal heart sounds.  No murmur heard. Pulmonary/Chest: Effort normal and breath sounds normal. No respiratory distress.  Musculoskeletal: He exhibits no edema.  Lymphadenopathy:    He has no cervical adenopathy.  Neurological: He is alert. Coordination normal.  Skin: Skin is warm and dry.  Psychiatric: He has a normal mood and affect. His behavior is normal.  Vitals reviewed.  He has a seborrheic keratosis plus a skin tag on his back neither one are cancerous these were reviewed with the patient I even took a picture showed him to him then delete that the picture       Assessment & Plan:  Muscle strain should  gradually get better stretching exercises recommended  Skin areas not concerning but patient was encouraged on a yearly basis to see dermatology to have them review over his skin he states he can set himself up

## 2018-07-03 DIAGNOSIS — M9902 Segmental and somatic dysfunction of thoracic region: Secondary | ICD-10-CM | POA: Diagnosis not present

## 2018-07-03 DIAGNOSIS — M9903 Segmental and somatic dysfunction of lumbar region: Secondary | ICD-10-CM | POA: Diagnosis not present

## 2018-07-03 DIAGNOSIS — M546 Pain in thoracic spine: Secondary | ICD-10-CM | POA: Diagnosis not present

## 2018-07-03 DIAGNOSIS — M47812 Spondylosis without myelopathy or radiculopathy, cervical region: Secondary | ICD-10-CM | POA: Diagnosis not present

## 2018-07-03 DIAGNOSIS — M9901 Segmental and somatic dysfunction of cervical region: Secondary | ICD-10-CM | POA: Diagnosis not present

## 2018-07-03 DIAGNOSIS — M47816 Spondylosis without myelopathy or radiculopathy, lumbar region: Secondary | ICD-10-CM | POA: Diagnosis not present

## 2018-07-06 ENCOUNTER — Telehealth: Payer: Self-pay | Admitting: Family Medicine

## 2018-07-06 NOTE — Telephone Encounter (Signed)
Please let the patient know that I did receive the records from his orthopedist  His MRI showed a herniated disc that is pushing on a nerve It did not show spinal stenosis Their last recommendations was for him to consider following up with Dr Gladstone Lighter or other back surgeons to get their opinion on whether or not surgery would be helpful  If he needs our assistance setting this up he is to let us know otherwise he can follow-up with Dr Gladstone Lighter  They also stated that they could try injections. (I would recommend that the patient follow-up with them so that they can help guide him regarding his treatment-if he wants our help setting up appointment let me know)

## 2018-07-08 ENCOUNTER — Telehealth: Payer: Self-pay | Admitting: Family Medicine

## 2018-07-08 ENCOUNTER — Other Ambulatory Visit: Payer: Self-pay | Admitting: Family Medicine

## 2018-07-08 DIAGNOSIS — M5020 Other cervical disc displacement, unspecified cervical region: Secondary | ICD-10-CM

## 2018-07-08 DIAGNOSIS — R208 Other disturbances of skin sensation: Secondary | ICD-10-CM | POA: Diagnosis not present

## 2018-07-08 DIAGNOSIS — D225 Melanocytic nevi of trunk: Secondary | ICD-10-CM | POA: Diagnosis not present

## 2018-07-08 DIAGNOSIS — L821 Other seborrheic keratosis: Secondary | ICD-10-CM | POA: Diagnosis not present

## 2018-07-08 DIAGNOSIS — L118 Other specified acantholytic disorders: Secondary | ICD-10-CM | POA: Diagnosis not present

## 2018-07-08 NOTE — Telephone Encounter (Signed)
I would recommend neurosurgeon  [facility] neurosurgery in [place] would be who we would typically use) (He may have referral to them for herniated disc if he needs 1)

## 2018-07-08 NOTE — Telephone Encounter (Signed)
Please advise. Thank you

## 2018-07-08 NOTE — Telephone Encounter (Signed)
Contacted patient. Pt wanting to know if he should see orthopedic surgeon or Neurosurgeon. Pt states that he has had shots in back a couple of times and the last one seem to help but pain is still there and movement makes it worse. Please advise. Thank you.

## 2018-07-08 NOTE — Telephone Encounter (Signed)
Contacted patient to inform that we would put in referral to [facility] in [place]. Referral placed and patient verbalized understanding.

## 2018-07-08 NOTE — Telephone Encounter (Signed)
Pt will need MRI or CT within 6 months to send with neurosurgery referral  Last MRI was 2017   Please advise

## 2018-07-08 NOTE — Telephone Encounter (Signed)
Patient had MRI with [facility] He will need to go by sign release from them and potentially get the MRI on CD ROM to send to neurosurgery

## 2018-07-09 NOTE — Telephone Encounter (Signed)
Spoke with patient, he will come here to sign release so we can get MRI report to send with neurosurgery referral  He will also let us copy his insurance card so we can send with referral due to plan will be active 07/24/18

## 2018-07-12 DIAGNOSIS — M9901 Segmental and somatic dysfunction of cervical region: Secondary | ICD-10-CM | POA: Diagnosis not present

## 2018-07-12 DIAGNOSIS — M9903 Segmental and somatic dysfunction of lumbar region: Secondary | ICD-10-CM | POA: Diagnosis not present

## 2018-07-12 DIAGNOSIS — M546 Pain in thoracic spine: Secondary | ICD-10-CM | POA: Diagnosis not present

## 2018-07-12 DIAGNOSIS — M47812 Spondylosis without myelopathy or radiculopathy, cervical region: Secondary | ICD-10-CM | POA: Diagnosis not present

## 2018-07-12 DIAGNOSIS — M9902 Segmental and somatic dysfunction of thoracic region: Secondary | ICD-10-CM | POA: Diagnosis not present

## 2018-07-12 DIAGNOSIS — M47816 Spondylosis without myelopathy or radiculopathy, lumbar region: Secondary | ICD-10-CM | POA: Diagnosis not present

## 2018-07-15 ENCOUNTER — Encounter: Payer: Self-pay | Admitting: Family Medicine

## 2018-07-22 ENCOUNTER — Other Ambulatory Visit: Payer: Self-pay

## 2018-07-22 MED ORDER — OMEPRAZOLE 20 MG PO CPDR
20.0000 mg | DELAYED_RELEASE_CAPSULE | Freq: Every day | ORAL | 1 refills | Status: DC
Start: 2018-07-22 — End: 2018-10-15

## 2018-07-22 MED ORDER — AMLODIPINE BESYLATE 2.5 MG PO TABS: 2.5000 mg | ORAL_TABLET | Freq: Every day | ORAL | 1 refills | Status: DC

## 2018-07-31 DIAGNOSIS — M9903 Segmental and somatic dysfunction of lumbar region: Secondary | ICD-10-CM | POA: Diagnosis not present

## 2018-07-31 DIAGNOSIS — M9901 Segmental and somatic dysfunction of cervical region: Secondary | ICD-10-CM | POA: Diagnosis not present

## 2018-07-31 DIAGNOSIS — M9902 Segmental and somatic dysfunction of thoracic region: Secondary | ICD-10-CM | POA: Diagnosis not present

## 2018-08-09 ENCOUNTER — Ambulatory Visit (INDEPENDENT_AMBULATORY_CARE_PROVIDER_SITE_OTHER): Payer: Medicare Other | Admitting: Family Medicine

## 2018-08-09 ENCOUNTER — Encounter: Payer: Self-pay | Admitting: Family Medicine

## 2018-08-09 VITALS — BP 130/80 | Ht 71.0 in | Wt 158.0 lb

## 2018-08-09 DIAGNOSIS — Z13220 Encounter for screening for lipoid disorders: Secondary | ICD-10-CM

## 2018-08-09 DIAGNOSIS — L723 Sebaceous cyst: Secondary | ICD-10-CM

## 2018-08-09 DIAGNOSIS — I10 Essential (primary) hypertension: Secondary | ICD-10-CM | POA: Diagnosis not present

## 2018-08-09 DIAGNOSIS — M545 Low back pain, unspecified: Secondary | ICD-10-CM

## 2018-08-09 DIAGNOSIS — Z1159 Encounter for screening for other viral diseases: Secondary | ICD-10-CM

## 2018-08-09 DIAGNOSIS — K219 Gastro-esophageal reflux disease without esophagitis: Secondary | ICD-10-CM

## 2018-08-09 NOTE — Progress Notes (Signed)
   Subjective:    Patient ID: David Pham, male    DOB: 07/09/1951, 68 y.o.   MRN: [id number]  HPI  Patient states he his here today to discuss a neurosurgeon for a nerve in his back.He states he had an Mri and there was a nerve being compressed in his back.  I reviewed over the MRI that he had done at [facility] the patient relates chronic low back pain denies much in the way of radiation is up occasionally down the right leg when he stands he still able to do most things but he does get stiff in the back when he squats or bends over  He also has a cyst on his lip that has been there for years that you had asked him about years ago. That area on the lip is not causing him any pain or discomfort not draining no redness or tenderness with it. Review of Systems  Constitutional: Negative for activity change.  HENT: Negative for congestion and rhinorrhea.   Respiratory: Negative for cough and shortness of breath.   Cardiovascular: Negative for chest pain.  Gastrointestinal: Negative for abdominal pain, diarrhea, nausea and vomiting.  Genitourinary: Negative for dysuria and hematuria.  Neurological: Negative for weakness and headaches.  Psychiatric/Behavioral: Negative for behavioral problems and confusion.       Objective:   Physical Exam Vitals signs reviewed.  Constitutional:      General: He is not in acute distress. HENT:     Head: Normocephalic and atraumatic.  Eyes:     General:        Right eye: No discharge.        Left eye: No discharge.  Neck:     Trachea: No tracheal deviation.  Cardiovascular:     Rate and Rhythm: Normal rate and regular rhythm.     Heart sounds: Normal heart sounds. No murmur.  Pulmonary:     Effort: Pulmonary effort is normal. No respiratory distress.     Breath sounds: Normal breath sounds.  Lymphadenopathy:     Cervical: No cervical adenopathy.  Skin:    General: Skin is warm and dry.  Neurological:     Mental Status: He is alert.      Coordination: Coordination normal.  Psychiatric:        Behavior: Behavior normal.    The cyst on his lip is a sebaceous cyst there does not appear to be anything that points toward cancer       Assessment & Plan:  Sebaceous cyst recommend ENT referral patient would like to defer on this for now we will watch it  Low back pain discomfort no sign of any major issues we discussed conservative care versus surgical at this point he would like to do conservative care  Patient will do wellness plus also lab work and follow-up in 3 months at that time

## 2018-09-20 DIAGNOSIS — M9902 Segmental and somatic dysfunction of thoracic region: Secondary | ICD-10-CM | POA: Diagnosis not present

## 2018-09-20 DIAGNOSIS — M9901 Segmental and somatic dysfunction of cervical region: Secondary | ICD-10-CM | POA: Diagnosis not present

## 2018-09-20 DIAGNOSIS — M9903 Segmental and somatic dysfunction of lumbar region: Secondary | ICD-10-CM | POA: Diagnosis not present

## 2018-10-04 DIAGNOSIS — M9901 Segmental and somatic dysfunction of cervical region: Secondary | ICD-10-CM | POA: Diagnosis not present

## 2018-10-04 DIAGNOSIS — M9902 Segmental and somatic dysfunction of thoracic region: Secondary | ICD-10-CM | POA: Diagnosis not present

## 2018-10-04 DIAGNOSIS — M9903 Segmental and somatic dysfunction of lumbar region: Secondary | ICD-10-CM | POA: Diagnosis not present

## 2018-10-15 ENCOUNTER — Other Ambulatory Visit: Payer: Self-pay | Admitting: Family Medicine

## 2018-10-30 ENCOUNTER — Ambulatory Visit (INDEPENDENT_AMBULATORY_CARE_PROVIDER_SITE_OTHER): Payer: Medicare Other | Admitting: Family Medicine

## 2018-10-30 ENCOUNTER — Other Ambulatory Visit: Payer: Self-pay

## 2018-10-30 DIAGNOSIS — M545 Low back pain, unspecified: Secondary | ICD-10-CM

## 2018-10-30 MED ORDER — MELOXICAM 15 MG PO TABS: 15.0000 mg | ORAL_TABLET | Freq: Every day | ORAL | 1 refills | Status: DC

## 2018-10-30 NOTE — Progress Notes (Signed)
   Subjective:    Patient ID: David Pham, male    DOB: May 15, 1951, 68 y.o.   MRN: [id number]  Back Pain  This is a recurrent problem. Episode onset: one week ago. The quality of the pain is described as burning (tingling). Radiates to: right side. right hip, right leg. The symptoms are aggravated by standing. Associated symptoms include tingling. He has tried NSAIDs and home exercises (tylenol) for the symptoms. The treatment provided mild relief.   Pt has had 2 shot in back before. Last shot did help some. Pt does have ruptured disc.  The patient relates pain in his lower back radiates into the leg describes burning and pain discomfort more in the lower back he did have some x-rays.  He denies any other particular troubles. Virtual Visit via Telephone Note  I connected with David Pham on 10/30/18 at 10:30 AM EDT by telephone and verified that I am speaking with the correct person using two identifiers.   I discussed the limitations, risks, security and privacy concerns of performing an evaluation and management service by telephone and the availability of in person appointments. I also discussed with the patient that there may be a patient responsible charge related to this service. The patient expressed understanding and agreed to proceed.   History of Present Illness:    Observations/Objective: Patient with significant low back pain discomfort radiates into the leg denies any weakness in the legs states in the past knee injections helped he is gone call the place [facility] to see if they can help him she he is wondering if any medications might be helpful he denies abusing any medicine.  Is wary of side effects.  Patient denies any other particular troubles.  We did discuss multiple different medicines and side effects he agrees to trying anti-inflammatory to see if it will help  Assessment and Plan:   Follow Up Instructions:    I discussed the assessment and  treatment plan with the patient. The patient was provided an opportunity to ask questions and all were answered. The patient agreed with the plan and demonstrated an understanding of the instructions.   The patient was advised to call back or seek an in-person evaluation if the symptoms worsen or if the condition fails to improve as anticipated.  I provided 15 minutes of non-face-to-face time during this encounter.   Vicente Males, LPN   Review of Systems  Musculoskeletal: Positive for back pain.  Neurological: Positive for tingling.       Objective:   Physical Exam   Unable to do exam via phone     Assessment & Plan:  Lumbar pain He will connect with his specialist to see if they can do injections In addition to this Mobic will be used to try to help with his pain if that does not help consideration for gabapentin patient will let us know

## 2018-11-01 ENCOUNTER — Encounter: Payer: Medicare HMO | Admitting: Family Medicine

## 2018-11-08 ENCOUNTER — Encounter: Payer: Medicare Other | Admitting: Family Medicine

## 2018-12-12 DIAGNOSIS — H04123 Dry eye syndrome of bilateral lacrimal glands: Secondary | ICD-10-CM | POA: Diagnosis not present

## 2018-12-12 DIAGNOSIS — H353130 Nonexudative age-related macular degeneration, bilateral, stage unspecified: Secondary | ICD-10-CM | POA: Diagnosis not present

## 2018-12-12 DIAGNOSIS — H43811 Vitreous degeneration, right eye: Secondary | ICD-10-CM | POA: Diagnosis not present

## 2018-12-12 DIAGNOSIS — H43393 Other vitreous opacities, bilateral: Secondary | ICD-10-CM | POA: Diagnosis not present

## 2018-12-12 DIAGNOSIS — H259 Unspecified age-related cataract: Secondary | ICD-10-CM | POA: Diagnosis not present

## 2019-01-10 ENCOUNTER — Encounter: Payer: Medicare Other | Admitting: Family Medicine

## 2019-01-10 DIAGNOSIS — Z13220 Encounter for screening for lipoid disorders: Secondary | ICD-10-CM | POA: Diagnosis not present

## 2019-01-10 DIAGNOSIS — I10 Essential (primary) hypertension: Secondary | ICD-10-CM | POA: Diagnosis not present

## 2019-01-10 DIAGNOSIS — Z1159 Encounter for screening for other viral diseases: Secondary | ICD-10-CM | POA: Diagnosis not present

## 2019-01-11 LAB — BASIC METABOLIC PANEL
BUN / CREAT RATIO: 13 (ref 10–24)
BUN: 11 mg/dL (ref 8–27)
CALCIUM: 9.4 mg/dL (ref 8.6–10.2)
CO2: 24 mmol/L (ref 20–29)
Chloride: 102 mmol/L (ref 96–106)
Creatinine, Ser: 0.87 mg/dL (ref 0.76–1.27)
GFR calc non Af Amer: 89 mL/min/{1.73_m2} (ref >59)
GFR, EST AFRICAN AMERICAN: 103 mL/min/{1.73_m2} (ref >59)
Glucose: 96 mg/dL (ref 65–99)
POTASSIUM: 4.4 mmol/L (ref 3.5–5.2)
Sodium: 140 mmol/L (ref 134–144)

## 2019-01-11 LAB — LIPID PANEL
CHOLESTEROL TOTAL: 184 mg/dL (ref 100–199)
Chol/HDL Ratio: 3.8 ratio (ref 0.0–5.0)
HDL: 49 mg/dL (ref >39)
LDL CALC: 121 mg/dL — AB (ref 0–99)
Triglycerides: 71 mg/dL (ref 0–149)
VLDL Cholesterol Cal: 14 mg/dL (ref 5–40)

## 2019-01-11 LAB — HEPATITIS C ANTIBODY

## 2019-01-17 ENCOUNTER — Encounter: Payer: Self-pay | Admitting: Family Medicine

## 2019-01-17 ENCOUNTER — Telehealth: Payer: Self-pay | Admitting: Family Medicine

## 2019-01-17 ENCOUNTER — Ambulatory Visit (INDEPENDENT_AMBULATORY_CARE_PROVIDER_SITE_OTHER): Payer: Medicare Other | Admitting: Family Medicine

## 2019-01-17 ENCOUNTER — Other Ambulatory Visit: Payer: Self-pay

## 2019-01-17 VITALS — BP 134/86 | Temp 97.6°F | Ht 68.0 in | Wt 156.0 lb

## 2019-01-17 DIAGNOSIS — M25551 Pain in right hip: Secondary | ICD-10-CM | POA: Diagnosis not present

## 2019-01-17 DIAGNOSIS — I10 Essential (primary) hypertension: Secondary | ICD-10-CM | POA: Diagnosis not present

## 2019-01-17 DIAGNOSIS — Z13220 Encounter for screening for lipoid disorders: Secondary | ICD-10-CM

## 2019-01-17 DIAGNOSIS — J301 Allergic rhinitis due to pollen: Secondary | ICD-10-CM | POA: Diagnosis not present

## 2019-01-17 DIAGNOSIS — Z0000 Encounter for general adult medical examination without abnormal findings: Secondary | ICD-10-CM

## 2019-01-17 NOTE — Patient Instructions (Addendum)
Shingrix and shingles prevention: know the facts!   Shingrix is a very effective vaccine to prevent shingles.   Shingles is a reactivation of chickenpox -more than 99% of Americans born before 1980 have had chickenpox even if they do not remember it. One in every 10 people who get shingles have severe long-lasting nerve pain as a result.   33 out of a 100 older adults will get shingles if they are unvaccinated.     This vaccine is very important for your health This vaccine is indicated for anyone 50 years or older. You can get this vaccine even if you have already had shingles because you can get the disease more than once in a lifetime.  Your risk for shingles and its complications increases with age.  This vaccine has 2 doses.  The second dose would be 2 to 6 months after the first dose.  If you had Zostavax vaccine in the past you should still get Shingrix. ( Zostavax is only 70% effective and it loses significant strength over a few years .)  This vaccine is given through the pharmacy.  The cost of the vaccine is through your insurance. The pharmacy can inform you of the total costs.  Common side effects including soreness in the arm, some redness and swelling, also some feel fatigue muscle soreness headache low-grade fever.  Side effects typically go away within 2 to 3 days. Remember-the pain from shingles can last a lifetime but these side effects of the vaccine will only last a few days at most. It is very important to get both doses in order to protect yourself fully.   Please get this vaccine at your earliest convenience at your trusted pharmacy.  Patient due for colonoscopy 2023

## 2019-01-17 NOTE — Telephone Encounter (Signed)
Last labs 01/10/19: Lipid, Met 7 and Hep C

## 2019-01-17 NOTE — Telephone Encounter (Signed)
Pt has appt 05/30/2019 - needs lab work ordered so he can get it done before his next visit  Please advise & call pt

## 2019-01-17 NOTE — Telephone Encounter (Signed)
I recommend a lipid profile mail the papers to home and have him do these later in October before his next follow-up office visit

## 2019-01-17 NOTE — Progress Notes (Signed)
Subjective:    Patient ID: David Pham, male    DOB: 1951-06-11, 68 y.o.   MRN: [id number] Very nice patient Brings up multiple different issues Covered wellness but also covered other issues HPI The patient comes in today for a wellness visit.  I will get lab all good doing the driving  A review of their health history was completed.  A review of medications was also completed.  Any needed refills; would like 90 day supply on meds  Eating habits: health conscious  Falls/  MVA accidents in past few months: none  Regular exercise: yes walking  Specialist pt sees on regular basis: none  Preventative health issues were discussed.   Additional concerns: itchy red spot on right lower leg. Came up a couple of days ago.  This area appears more likely that his bug bite more than likely will get better on its own no sign of any type infection   Patient for blood pressure check up.  The patient does have hypertension.  The patient is on medication.  Patient relates compliance with meds. Todays BP reviewed with the patient. Patient denies issues with medication. Patient relates reasonable diet. Patient tries to minimize salt. Patient aware of BP goals.  Also has some allergic rhinitis using allergy medicine seems to do well  Right hip pain and discomfort with movement.  Does not radiate down the leg.  Has known spondylolisthesis of the spine Review of Systems  Constitutional: Negative for activity change, appetite change and fever.  HENT: Negative for congestion and rhinorrhea.   Eyes: Negative for discharge.  Respiratory: Negative for cough and wheezing.   Cardiovascular: Negative for chest pain.  Gastrointestinal: Negative for abdominal pain, blood in stool and vomiting.  Genitourinary: Negative for difficulty urinating and frequency.  Musculoskeletal: Negative for neck pain.  Skin: Negative for rash.  Allergic/Immunologic: Negative for environmental allergies and food  allergies.  Neurological: Negative for weakness and headaches.  Psychiatric/Behavioral: Negative for agitation.       Objective:   Physical Exam Constitutional:      Appearance: He is well-developed.  HENT:     Head: Normocephalic and atraumatic.     Right Ear: External ear normal.     Left Ear: External ear normal.     Nose: Nose normal.  Eyes:     Pupils: Pupils are equal, round, and reactive to light.  Neck:     Musculoskeletal: Normal range of motion and neck supple.     Thyroid: No thyromegaly.  Cardiovascular:     Rate and Rhythm: Normal rate and regular rhythm.     Heart sounds: Normal heart sounds. No murmur.  Pulmonary:     Effort: Pulmonary effort is normal. No respiratory distress.     Breath sounds: Normal breath sounds. No wheezing.  Abdominal:     General: Bowel sounds are normal. There is no distension.     Palpations: Abdomen is soft. There is no mass.     Tenderness: There is no abdominal tenderness.  Genitourinary:    Penis: Normal.   Musculoskeletal: Normal range of motion.  Lymphadenopathy:     Cervical: No cervical adenopathy.  Skin:    General: Skin is warm and dry.     Findings: No erythema.  Neurological:     Mental Status: He is alert.     Motor: No abnormal muscle tone.  Psychiatric:        Behavior: Behavior normal.  Judgment: Judgment normal.    Passes mini cognitive test Recommend Shingrix Not depressed  Patient had prostatectomy     Assessment & Plan:  HTN- Patient was seen today as part of a visit regarding hypertension. The importance of healthy diet and regular physical activity was discussed. The importance of compliance with medications discussed.  Ideal goal is to keep blood pressure low elevated levels certainly below 757/97 when possible.  The patient was counseled that keeping blood pressure under control lessen his risk of complications.  The importance of regular follow-ups was discussed with the patient.   Low-salt diet such as DASH recommended.  Regular physical activity was recommended as well.  Patient was advised to keep regular follow-ups.  Adult wellness-complete.wellness physical was conducted today. Importance of diet and exercise were discussed in detail.  In addition to this a discussion regarding safety was also covered. We also reviewed over immunizations and gave recommendations regarding current immunization needed for age.  In addition to this additional areas were also touched on including: Preventative health exams needed:  Colonoscopy 2023  Patient was advised yearly wellness exam   Hip pain and discomfort may use anti-inflammatory.  Shingrix recommended  LDL cholesterol went up recheck it again this fall may end up needing to be on statin if he does not get better he will work on diet

## 2019-01-17 NOTE — Telephone Encounter (Signed)
Blood work papers mailed to patient.

## 2019-01-31 ENCOUNTER — Other Ambulatory Visit: Payer: Self-pay | Admitting: Family Medicine

## 2019-02-17 ENCOUNTER — Ambulatory Visit (INDEPENDENT_AMBULATORY_CARE_PROVIDER_SITE_OTHER): Payer: Medicare Other | Admitting: Family Medicine

## 2019-02-17 ENCOUNTER — Other Ambulatory Visit: Payer: Self-pay

## 2019-02-17 DIAGNOSIS — J301 Allergic rhinitis due to pollen: Secondary | ICD-10-CM

## 2019-02-17 DIAGNOSIS — B9689 Other specified bacterial agents as the cause of diseases classified elsewhere: Secondary | ICD-10-CM | POA: Diagnosis not present

## 2019-02-17 DIAGNOSIS — J019 Acute sinusitis, unspecified: Secondary | ICD-10-CM | POA: Diagnosis not present

## 2019-02-17 MED ORDER — DOXYCYCLINE HYCLATE 100 MG PO TABS: 100.0000 mg | ORAL_TABLET | Freq: Two times a day (BID) | ORAL | 0 refills | Status: DC

## 2019-02-17 MED ORDER — FLUTICASONE PROPIONATE 50 MCG/ACT NA SUSP: 2.0000 | Freq: Every day | NASAL | 6 refills | Status: DC

## 2019-02-17 MED ORDER — FEXOFENADINE HCL 180 MG PO TABS: ORAL_TABLET | ORAL | 0 refills | Status: DC

## 2019-02-17 NOTE — Addendum Note (Signed)
Addended by: Vicente Males on: 02/17/2019 02:42 PM   Modules accepted: Orders

## 2019-02-17 NOTE — Progress Notes (Signed)
   Subjective:    Patient ID: Clyda Greener, male    DOB: [birth year], 68 y.o.   MRN: [id number] Phone visit only video not capable Sinusitis This is a new problem. The current episode started in the past 7 days. Associated symptoms include congestion and coughing. Pertinent negatives include no chills or ear pain. (Has to clear throat often, nasal drainage) Past treatments include saline sprays (allergy tablet). The treatment provided no relief.  Patient relates a lot of allergy symptoms head congestion sinus pressure  Virtual Visit via Video Note  I connected with Clyda Greener on 02/17/19 at  2:00 PM EDT by a video enabled telemedicine application and verified that I am speaking with the correct person using two identifiers.  Location: Patient: home Provider: office   I discussed the limitations of evaluation and management by telemedicine and the availability of in person appointments. The patient expressed understanding and agreed to proceed.  History of Present Illness:    Observations/Objective:   Assessment and Plan:   Follow Up Instructions:    I discussed the assessment and treatment plan with the patient. The patient was provided an opportunity to ask questions and all were answered. The patient agreed with the plan and demonstrated an understanding of the instructions.   The patient was advised to call back or seek an in-person evaluation if the symptoms worsen or if the condition fails to improve as anticipated.  I provided 15 minutes of non-face-to-face time during this encounter.   Vicente Males, LPN   Review of Systems  Constitutional: Negative for activity change, chills and fever.  HENT: Positive for congestion and rhinorrhea. Negative for ear pain.   Eyes: Negative for discharge.  Respiratory: Positive for cough. Negative for wheezing.   Cardiovascular: Negative for chest pain.  Gastrointestinal: Negative for nausea and vomiting.  Musculoskeletal:  Negative for arthralgias.       Objective:   Physical Exam  Patient had virtual visit Appears to be in no distress Atraumatic Neuro able to relate and oriented No apparent resp distress Color normal       Assessment & Plan:  Allergic rhinitis Allergy medicines fexofenadine and Flonase Mild sinus infection Warning signs discussed I doubt patient has COVID Warning signs were discussed if he should start having the symptoms then we would want to do COVID testing call us back if any ongoing issues

## 2019-03-14 ENCOUNTER — Other Ambulatory Visit: Payer: Self-pay

## 2019-03-14 ENCOUNTER — Ambulatory Visit (INDEPENDENT_AMBULATORY_CARE_PROVIDER_SITE_OTHER): Payer: Medicare Other | Admitting: Family Medicine

## 2019-03-14 DIAGNOSIS — J019 Acute sinusitis, unspecified: Secondary | ICD-10-CM | POA: Diagnosis not present

## 2019-03-14 DIAGNOSIS — B9689 Other specified bacterial agents as the cause of diseases classified elsewhere: Secondary | ICD-10-CM | POA: Diagnosis not present

## 2019-03-14 MED ORDER — CEFPROZIL 500 MG PO TABS: 500.0000 mg | ORAL_TABLET | Freq: Two times a day (BID) | ORAL | 0 refills | Status: DC

## 2019-03-14 NOTE — Progress Notes (Signed)
   Subjective:  Audio plus video  Patient ID: David Pham, male    DOB: [birth year], 68 y.o.   MRN: [id number]  Sinus Problem This is a new problem. The current episode started in the past 7 days. Associated symptoms include congestion and sinus pressure.    Sig cong        Review of Systems  HENT: Positive for congestion and sinus pressure.    Virtual Visit via Video Note  I connected with David Pham on 03/14/19 at 11:00 AM EDT by a video enabled telemedicine application and verified that I am speaking with the correct person using two identifiers.  Location: Patient: home Provider: office   I discussed the limitations of evaluation and management by telemedicine and the availability of in person appointments. The patient expressed understanding and agreed to proceed.  History of Present Illness:    Observations/Objective:   Assessment and Plan:   Follow Up Instructions:    I discussed the assessment and treatment plan with the patient. The patient was provided an opportunity to ask questions and all were answered. The patient agreed with the plan and demonstrated an understanding of the instructions.   The patient was advised to call back or seek an in-person evaluation if the symptoms worsen or if the condition fails to improve as anticipated.  I provided 18 minutes of non-face-to-face time during this encounter.  Patient states his sinus involvement is very similar to his usual flares.  Has had nasal congestion.  Longstanding allergy tendencies.  Recently started on antihistamine and steroid nasal spray although had gotten away from using it.  Progressive frontal headache.  Drainage waiting to postnasal drip type cough  Headache achy at times sharp other times  No vomiting no diarrhea no abdominal pain    Objective:   Physical Exam  Virtual      Assessment & Plan:  Impression recurrent sinusitis.  Discussed at length.  1 more round of  antibiotics.  Encouraged to get back on his Flonase and Allegra on a very regular basis.  Several questions answered regarding symptomatic care

## 2019-03-15 ENCOUNTER — Encounter: Payer: Self-pay | Admitting: Family Medicine

## 2019-04-07 ENCOUNTER — Other Ambulatory Visit: Payer: Self-pay | Admitting: Family Medicine

## 2019-04-09 ENCOUNTER — Encounter: Payer: Self-pay | Admitting: Family Medicine

## 2019-04-09 ENCOUNTER — Ambulatory Visit (INDEPENDENT_AMBULATORY_CARE_PROVIDER_SITE_OTHER): Payer: Medicare Other | Admitting: Family Medicine

## 2019-04-09 DIAGNOSIS — J321 Chronic frontal sinusitis: Secondary | ICD-10-CM | POA: Diagnosis not present

## 2019-04-09 DIAGNOSIS — J302 Other seasonal allergic rhinitis: Secondary | ICD-10-CM | POA: Diagnosis not present

## 2019-04-09 MED ORDER — SULFAMETHOXAZOLE-TRIMETHOPRIM 800-160 MG PO TABS: 1.0000 | ORAL_TABLET | Freq: Two times a day (BID) | ORAL | 0 refills | Status: DC

## 2019-04-09 MED ORDER — FLUTICASONE PROPIONATE 50 MCG/ACT NA SUSP: 2.0000 | Freq: Every day | NASAL | 2 refills | Status: DC

## 2019-04-09 NOTE — Progress Notes (Signed)
   Subjective:  Audio only  Patient ID: David Pham, male    DOB: [birth year], 68 y.o.   MRN: [id number]  Sinus Problem This is a new problem. The current episode started more than 1 month ago. Associated symptoms include congestion, a hoarse voice and sinus pressure. Treatments tried: 2 antibiotics and zyrtec.      Review of Systems  HENT: Positive for congestion, hoarse voice and sinus pressure.    Virtual Visit via Video Note  I connected with Clyda Greener on 04/09/19 at 10:00 AM EDT by a video enabled telemedicine application and verified that I am speaking with the correct person using two identifiers.  Location: Patient: home Provider: office   I discussed the limitations of evaluation and management by telemedicine and the availability of in person appointments. The patient expressed understanding and agreed to proceed.  History of Present Illness:    Observations/Objective:   Assessment and Plan:   Follow Up Instructions:    I discussed the assessment and treatment plan with the patient. The patient was provided an opportunity to ask questions and all were answered. The patient agreed with the plan and demonstrated an understanding of the instructions.   The patient was advised to call back or seek an in-person evaluation if the symptoms worsen or if the condition fails to improve as anticipated.  18 minutes of non-face-to-face time during this encounter.    Prior visits reviewed.  Patient continues to manifest chronic nasal congestion.  Chronic clearing of the throat.  Chronic postnasal drip.  Antibiotics helped transiently but then symptoms return.  Use Flonase initially only a short period of time.  Has been using Afrin more lately though claims not for greater than 7 days No fever no chills no cough    Objective:   Physical Exam  Virtual      Assessment & Plan:  Impression chronic sinusitis with element of allergic rhinitis and potential  element of rhinitis medicamentosa.  All discussed.  3 weeks worth of Bactrim DS.  Resume Flonase.  Stop Afrin maintain Zyrtec rationale discussed

## 2019-04-27 ENCOUNTER — Other Ambulatory Visit: Payer: Self-pay | Admitting: Family Medicine

## 2019-04-29 ENCOUNTER — Telehealth: Payer: Self-pay | Admitting: Family Medicine

## 2019-04-29 MED ORDER — DOXYCYCLINE HYCLATE 100 MG PO TABS: 100.0000 mg | ORAL_TABLET | Freq: Two times a day (BID) | ORAL | 0 refills | Status: DC

## 2019-04-29 NOTE — Telephone Encounter (Signed)
May use doxycycline 100 mg 1 twice daily 10 days Also recommend saline nasal spray regular basis

## 2019-04-29 NOTE — Telephone Encounter (Signed)
Patient had phone visit on 9/18 with a sinus infection ,he states not any better still has congestion in his nose and gets hoarse at times and has to clear his throat a lot. He states has once more antbiotic to take, [facility]-  Please advise

## 2019-04-29 NOTE — Telephone Encounter (Signed)
Prescription sent electronically to pharmacy. Patient notified. 

## 2019-05-09 ENCOUNTER — Other Ambulatory Visit: Payer: Self-pay

## 2019-05-09 ENCOUNTER — Ambulatory Visit (INDEPENDENT_AMBULATORY_CARE_PROVIDER_SITE_OTHER): Payer: Medicare Other | Admitting: Family Medicine

## 2019-05-09 ENCOUNTER — Encounter: Payer: Self-pay | Admitting: Family Medicine

## 2019-05-09 DIAGNOSIS — J321 Chronic frontal sinusitis: Secondary | ICD-10-CM | POA: Diagnosis not present

## 2019-05-09 NOTE — Progress Notes (Signed)
   Subjective:  Audio  Patient ID: David Pham, male    DOB: 19-Jan-1951, 67 y.o.   MRN: [id number]  Sinusitis This is a recurrent problem. Episode onset: august. Associated symptoms include congestion. (Has to constantly clear throat) Treatments tried: taking doxy currently.   Virtual Visit via Telephone Note  I connected with David Pham on 05/09/19 at  3:50 PM EDT by telephone and verified that I am speaking with the correct person using two identifiers.  Location: Patient: home Provider: office   I discussed the limitations, risks, security and privacy concerns of performing an evaluation and management service by telephone and the availability of in person appointments. I also discussed with the patient that there may be a patient responsible charge related to this service. The patient expressed understanding and agreed to proceed.   History of Present Illness:    Observations/Objective:   Assessment and Plan:   Follow Up Instructions:    I discussed the assessment and treatment plan with the patient. The patient was provided an opportunity to ask questions and all were answered. The patient agreed with the plan and demonstrated an understanding of the instructions.   The patient was advised to call back or seek an in-person evaluation if the symptoms worsen or if the condition fails to improve as anticipated.  I provided 18 minutes of non-face-to-face time during this encounter.   Patient calls once again for recurrence of his ongoing sinus issues.  Finishing up the last doxycycline prescription.  1 more day work.  Somewhat better.  Frustrated this is been going on for several months.  Congestion drainage tickle cough.  Intermittent hoarseness.  Intermittent congestion throat.      Review of Systems  HENT: Positive for congestion.        Objective:   Physical Exam  Virtual      Assessment & Plan:  Impression chronic recurrent sinusitis with  potential element of allergic rhinitis and postnasal drip.  First we will start with ENT referral.  Rationale discussed.  Finish antibiotics.  Hold off on further antibiotics for now substantial discussion held

## 2019-05-21 ENCOUNTER — Other Ambulatory Visit: Payer: Self-pay | Admitting: *Deleted

## 2019-05-21 MED ORDER — AMLODIPINE BESYLATE 2.5 MG PO TABS: 2.5000 mg | ORAL_TABLET | Freq: Every day | ORAL | 0 refills | Status: DC

## 2019-05-21 MED ORDER — FLUTICASONE PROPIONATE 50 MCG/ACT NA SUSP: 2.0000 | Freq: Every day | NASAL | 0 refills | Status: DC

## 2019-05-21 MED ORDER — OMEPRAZOLE 20 MG PO CPDR: DELAYED_RELEASE_CAPSULE | ORAL | 0 refills | Status: DC

## 2019-05-23 DIAGNOSIS — E785 Hyperlipidemia, unspecified: Secondary | ICD-10-CM | POA: Diagnosis not present

## 2019-05-23 DIAGNOSIS — Z13220 Encounter for screening for lipoid disorders: Secondary | ICD-10-CM | POA: Diagnosis not present

## 2019-05-24 LAB — LIPID PANEL
Chol/HDL Ratio: 4.2 ratio (ref 0.0–5.0)
Cholesterol, Total: 171 mg/dL (ref 100–199)
HDL: 41 mg/dL (ref >39)
LDL Chol Calc (NIH): 111 mg/dL — ABNORMAL HIGH (ref 0–99)
Triglycerides: 106 mg/dL (ref 0–149)
VLDL Cholesterol Cal: 19 mg/dL (ref 5–40)

## 2019-05-30 ENCOUNTER — Ambulatory Visit (INDEPENDENT_AMBULATORY_CARE_PROVIDER_SITE_OTHER): Payer: Medicare Other | Admitting: Family Medicine

## 2019-05-30 ENCOUNTER — Other Ambulatory Visit: Payer: Self-pay

## 2019-05-30 ENCOUNTER — Ambulatory Visit (HOSPITAL_COMMUNITY)
Admission: RE | Admit: 2019-05-30 | Discharge: 2019-05-30 | Disposition: A | Discharge status: Home / Self Care | Payer: Medicare Other | Source: Ambulatory Visit | Attending: Family Medicine | Admitting: Family Medicine

## 2019-05-30 VITALS — BP 122/80 | Temp 98.0°F | Wt 158.4 lb

## 2019-05-30 DIAGNOSIS — I10 Essential (primary) hypertension: Secondary | ICD-10-CM

## 2019-05-30 DIAGNOSIS — J301 Allergic rhinitis due to pollen: Secondary | ICD-10-CM | POA: Diagnosis not present

## 2019-05-30 DIAGNOSIS — R05 Cough: Secondary | ICD-10-CM

## 2019-05-30 DIAGNOSIS — K219 Gastro-esophageal reflux disease without esophagitis: Secondary | ICD-10-CM | POA: Diagnosis not present

## 2019-05-30 DIAGNOSIS — J321 Chronic frontal sinusitis: Secondary | ICD-10-CM

## 2019-05-30 DIAGNOSIS — R059 Cough, unspecified: Secondary | ICD-10-CM

## 2019-05-30 MED ORDER — FLUTICASONE PROPIONATE 50 MCG/ACT NA SUSP: 2.0000 | Freq: Every day | NASAL | 1 refills | Status: DC

## 2019-05-30 MED ORDER — AMLODIPINE BESYLATE 2.5 MG PO TABS: 2.5000 mg | ORAL_TABLET | Freq: Every day | ORAL | 1 refills | Status: DC

## 2019-05-30 MED ORDER — OMEPRAZOLE 20 MG PO CPDR: DELAYED_RELEASE_CAPSULE | ORAL | 1 refills | Status: DC

## 2019-05-30 NOTE — Progress Notes (Signed)
Subjective:    Patient ID: David Pham, male    DOB: Jan 19, 1951, 68 y.o.   MRN: [id number]  Hypertension This is a chronic problem. The current episode started more than 1 year ago. Pertinent negatives include no chest pain. Risk factors for coronary artery disease include male gender. Treatments tried: norvasc. There are no compliance problems.    Some constant throat clearing Chronic frontal sinusitis - Plan: Ambulatory referral to ENT  Essential hypertension  Non-seasonal allergic rhinitis due to pollen  Gastroesophageal reflux disease without esophagitis  Cough - Plan: CXR  This patient has been having significant underlying issues with constant postnasal drainage as well as clearing of his throat he is worried that there is something going on in his lungs he denies fevers chills sweats weight loss.  He is not a smoker  Patient does have intermittent reflux related issues with some intermittent burning but no dysphagia no vomiting no weight loss  Significant allergies for years worse in the summer pollen uses allergy medicine Flonase allergy tablet denies problem  Occasional cough which was felt to be related into postnasal drainage and reflux related issues  Review of Systems  Constitutional: Negative for activity change, chills and fever.  HENT: Positive for congestion and rhinorrhea. Negative for ear pain.   Eyes: Negative for discharge.  Respiratory: Positive for cough. Negative for wheezing.   Cardiovascular: Negative for chest pain.  Gastrointestinal: Negative for nausea and vomiting.  Musculoskeletal: Negative for arthralgias.       Objective:   Physical Exam Vitals signs and nursing note reviewed.  Constitutional:      Appearance: He is well-developed.  HENT:     Head: Normocephalic.     Mouth/Throat:     Pharynx: No oropharyngeal exudate.  Neck:     Musculoskeletal: Normal range of motion.  Cardiovascular:     Rate and Rhythm: Normal rate and  regular rhythm.     Heart sounds: Normal heart sounds. No murmur.  Pulmonary:     Effort: Pulmonary effort is normal.     Breath sounds: Normal breath sounds. No wheezing.  Lymphadenopathy:     Cervical: No cervical adenopathy.  Skin:    General: Skin is warm and dry.  Neurological:     Motor: No abnormal muscle tone.           Assessment & Plan:  HTN- Patient was seen today as part of a visit regarding hypertension. The importance of healthy diet and regular physical activity was discussed. The importance of compliance with medications discussed.  Ideal goal is to keep blood pressure low elevated levels certainly below [id number] when possible.  The patient was counseled that keeping blood pressure under control lessen his risk of complications.  The importance of regular follow-ups was discussed with the patient.  Low-salt diet such as DASH recommended.  Regular physical activity was recommended as well.  Patient was advised to keep regular follow-ups.  Intermittent cough this is more related to clearing of his throat I doubt he has any type of lung pathology x-ray ordered  Reflux related issues PPI recommended on a regular basis minimize caffeine's chocolates use bed on 6 inch blocks  Frequent throat clearing with concern by the patient referral to ENT for further evaluation for possibility of reflux or other issues  25 minutes was spent with the patient.  This statement verifies that 25 minutes was indeed spent with the patient.  More than 50% of this visit-total duration of the visit-was  spent in counseling and coordination of care. The issues that the patient came in for today as reflected in the diagnosis (s) please refer to documentation for further details.

## 2019-06-10 ENCOUNTER — Encounter: Payer: Self-pay | Admitting: Family Medicine

## 2019-07-10 ENCOUNTER — Other Ambulatory Visit: Payer: Self-pay | Admitting: Family Medicine

## 2019-07-26 ENCOUNTER — Other Ambulatory Visit: Payer: Self-pay | Admitting: Family Medicine

## 2019-08-01 DIAGNOSIS — H10013 Acute follicular conjunctivitis, bilateral: Secondary | ICD-10-CM | POA: Diagnosis not present

## 2019-08-01 DIAGNOSIS — H40033 Anatomical narrow angle, bilateral: Secondary | ICD-10-CM | POA: Diagnosis not present

## 2019-08-18 ENCOUNTER — Other Ambulatory Visit: Payer: Self-pay | Admitting: Otolaryngology

## 2019-08-18 ENCOUNTER — Other Ambulatory Visit (HOSPITAL_COMMUNITY): Payer: Self-pay | Admitting: Otolaryngology

## 2019-08-18 DIAGNOSIS — J310 Chronic rhinitis: Secondary | ICD-10-CM | POA: Diagnosis not present

## 2019-08-18 DIAGNOSIS — J329 Chronic sinusitis, unspecified: Secondary | ICD-10-CM

## 2019-08-18 DIAGNOSIS — J343 Hypertrophy of nasal turbinates: Secondary | ICD-10-CM | POA: Diagnosis not present

## 2019-08-18 DIAGNOSIS — J342 Deviated nasal septum: Secondary | ICD-10-CM | POA: Diagnosis not present

## 2019-08-28 ENCOUNTER — Ambulatory Visit (HOSPITAL_COMMUNITY)
Admission: RE | Admit: 2019-08-28 | Discharge: 2019-08-28 | Disposition: A | Discharge status: Home / Self Care | Payer: Medicare Other | Source: Ambulatory Visit | Attending: Otolaryngology | Admitting: Otolaryngology

## 2019-08-28 ENCOUNTER — Other Ambulatory Visit: Payer: Self-pay

## 2019-08-28 DIAGNOSIS — J329 Chronic sinusitis, unspecified: Secondary | ICD-10-CM | POA: Diagnosis not present

## 2019-08-28 DIAGNOSIS — J3489 Other specified disorders of nose and nasal sinuses: Secondary | ICD-10-CM | POA: Diagnosis not present

## 2019-09-09 ENCOUNTER — Other Ambulatory Visit: Payer: Self-pay | Admitting: Family Medicine

## 2019-09-16 IMAGING — DX DG CERVICAL SPINE COMPLETE 4+V
6 series · 7 of 7 positions shown · non-contrast
Comparison: 06/11/2007.

CLINICAL DATA: Neck pain.

EXAM:
CERVICAL SPINE - COMPLETE 4+ VIEW

[c-spine lat]
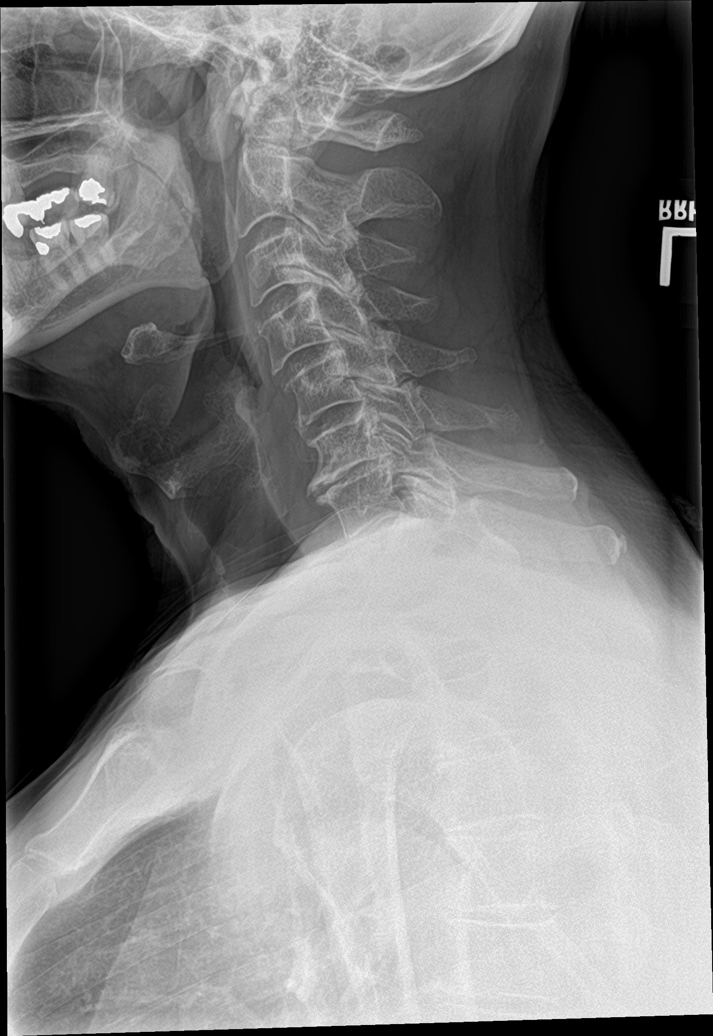

[c-spine obl (1 of 2)]
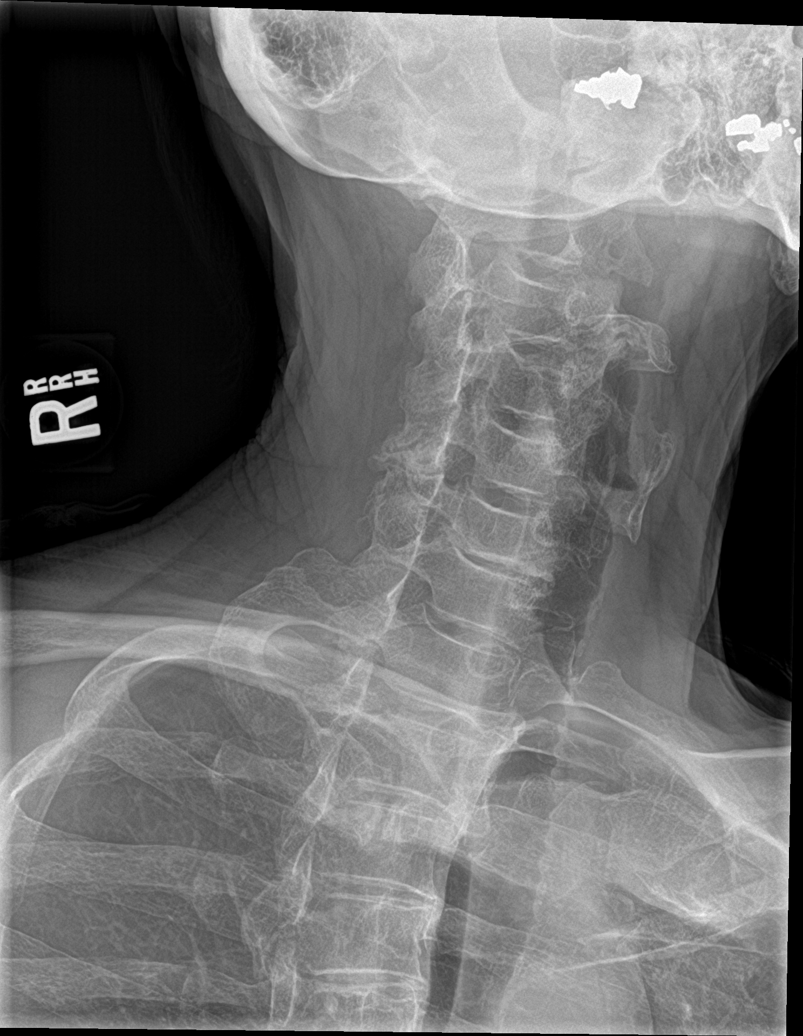

[c-spine obl (2 of 2)]
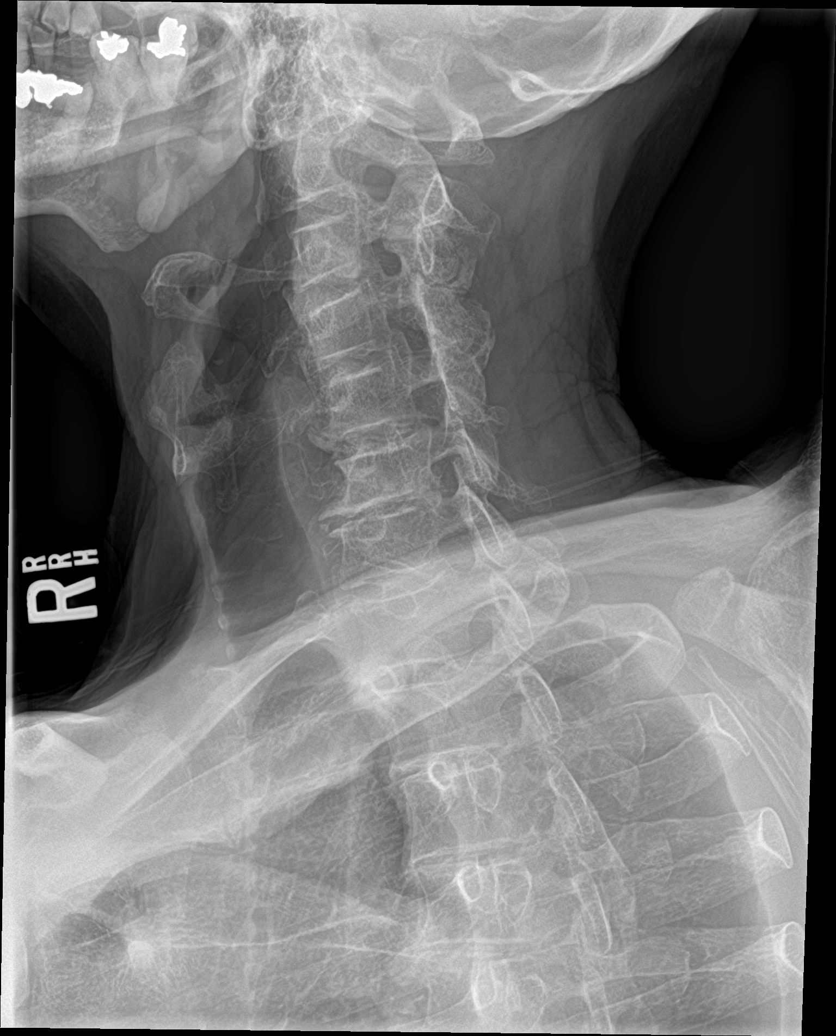

[c-spine ap]
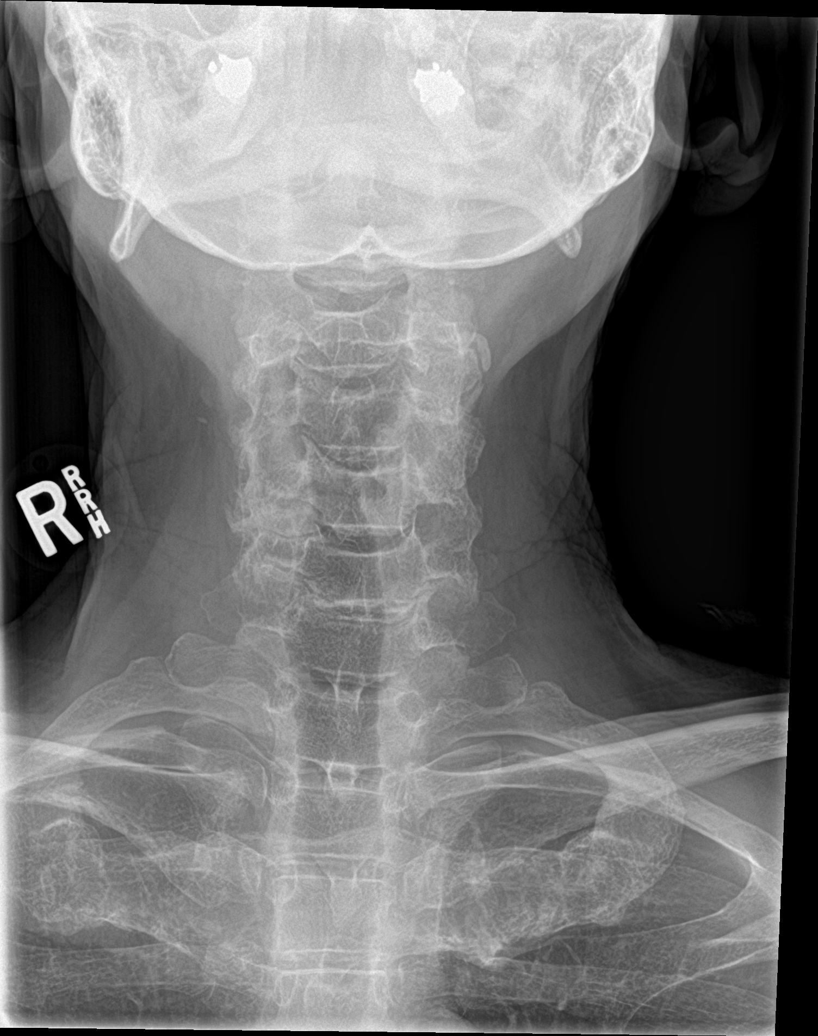

[Series 5: c-spine open mouth · 0.14mm/px · 2 of 2 slices shown]
[im 1/2]
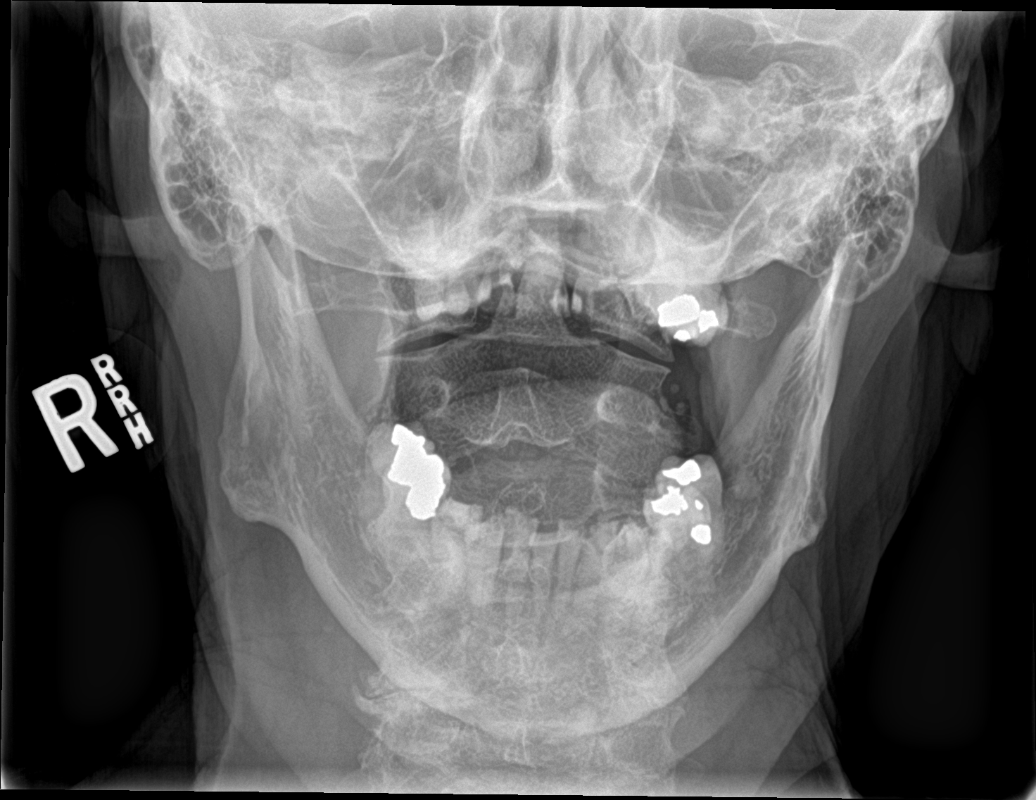
[im 2/2]
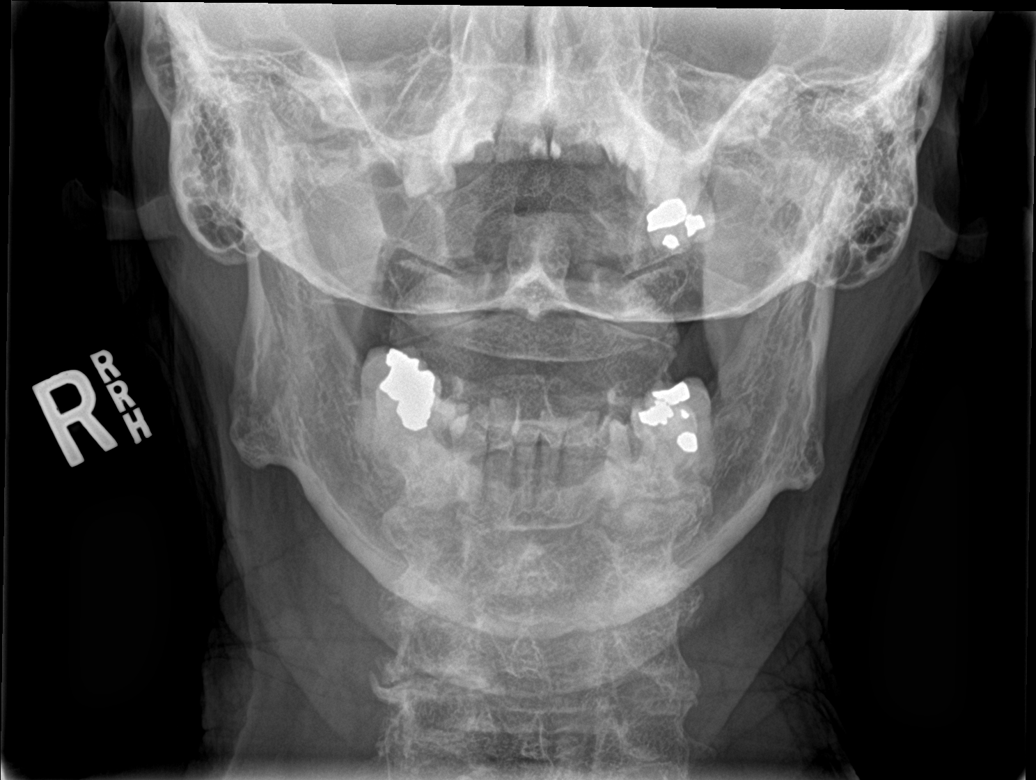

[c-spine swimmers trauma]
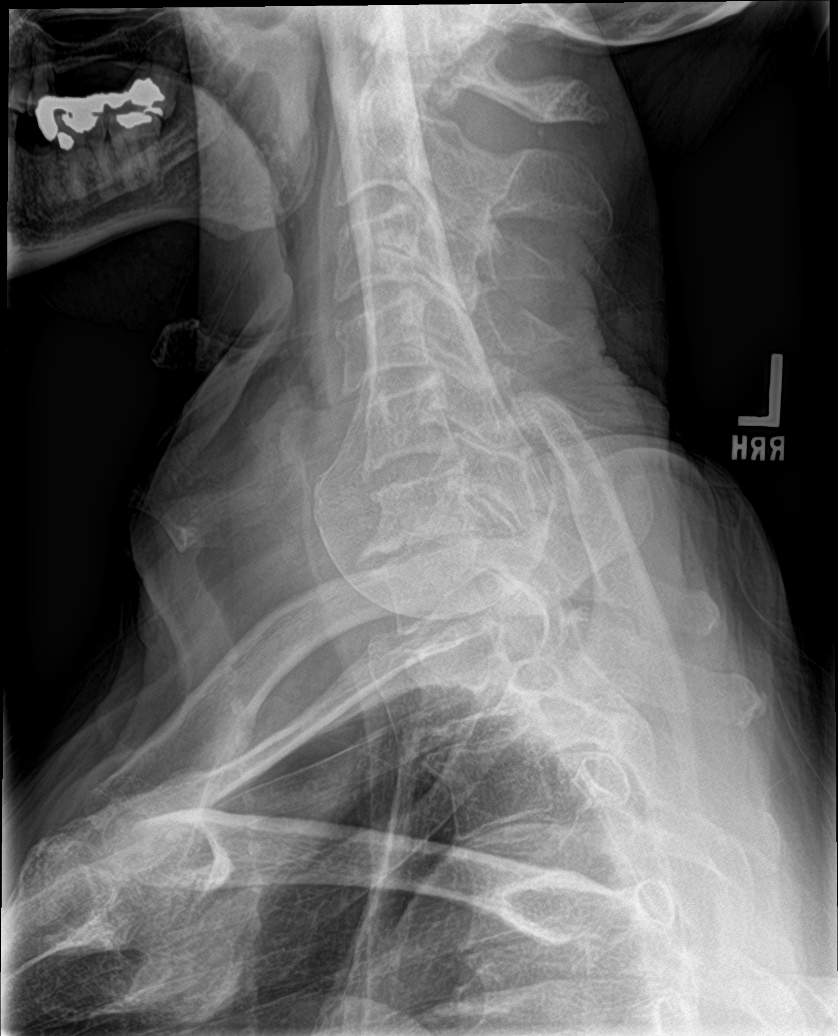

[7 of 7 positions shown; findings below may reference images not displayed]

FINDINGS: Diffuse multilevel degenerative change. No acute bony abnormality
identified. No evidence of fracture. Biapical pleural thickening
noted consistent with scarring. Carotid vascular calcification.
IMPRESSION: 1. Diffuse multilevel degenerative change. No acute bony
abnormality. No evidence of fracture.

2.  Carotid vascular disease.

## 2019-09-20 ENCOUNTER — Other Ambulatory Visit: Payer: Self-pay | Admitting: Family Medicine

## 2019-09-20 DIAGNOSIS — H5213 Myopia, bilateral: Secondary | ICD-10-CM | POA: Diagnosis not present

## 2019-09-22 DIAGNOSIS — J343 Hypertrophy of nasal turbinates: Secondary | ICD-10-CM | POA: Diagnosis not present

## 2019-09-22 DIAGNOSIS — J342 Deviated nasal septum: Secondary | ICD-10-CM | POA: Diagnosis not present

## 2019-09-22 DIAGNOSIS — J310 Chronic rhinitis: Secondary | ICD-10-CM | POA: Diagnosis not present

## 2019-09-22 DIAGNOSIS — R0982 Postnasal drip: Secondary | ICD-10-CM | POA: Diagnosis not present

## 2019-09-29 ENCOUNTER — Encounter: Payer: Self-pay | Admitting: Family Medicine

## 2019-09-29 ENCOUNTER — Ambulatory Visit (INDEPENDENT_AMBULATORY_CARE_PROVIDER_SITE_OTHER): Payer: Medicare Other | Admitting: Family Medicine

## 2019-09-29 ENCOUNTER — Other Ambulatory Visit: Payer: Self-pay

## 2019-09-29 VITALS — BP 118/72 | Temp 98.2°F | Wt 162.2 lb

## 2019-09-29 DIAGNOSIS — K219 Gastro-esophageal reflux disease without esophagitis: Secondary | ICD-10-CM

## 2019-09-29 DIAGNOSIS — R6889 Other general symptoms and signs: Secondary | ICD-10-CM | POA: Diagnosis not present

## 2019-09-29 DIAGNOSIS — R0989 Other specified symptoms and signs involving the circulatory and respiratory systems: Secondary | ICD-10-CM | POA: Insufficient documentation

## 2019-09-29 MED ORDER — PANTOPRAZOLE SODIUM 40 MG PO TBEC: 40.0000 mg | DELAYED_RELEASE_TABLET | Freq: Every day | ORAL | 3 refills | Status: DC

## 2019-09-29 NOTE — Progress Notes (Signed)
   Subjective:    Patient ID: David Pham, male    DOB: 04-Sep-1950, 69 y.o.   MRN: [id number]  HPI  Patient arrives to discuss reflux. Patient is having hoarseness and sometimes feels like food gets stuck in throat. Patient seen ENT and he could find no cause so he wonders if it is reflux- he doesn't get a lot of heartburn.  Patient with multiple different issues he states he has ongoing hoarseness seen by ENT twice they told him it could be reflux they did not find any ENT problems his CAT scan looked good he denies any dysphagia but previously he did have to have a EGD he states he is having some intermittent hoarseness also clears his throat a lot he does not know if this is a habit or due to drainage he denies wheezing or shortness of breath Review of Systems  Constitutional: Negative for activity change.  HENT: Negative for congestion and rhinorrhea.   Respiratory: Negative for cough and shortness of breath.   Cardiovascular: Negative for chest pain.  Gastrointestinal: Negative for abdominal pain, diarrhea, nausea and vomiting.  Genitourinary: Negative for dysuria and hematuria.  Neurological: Negative for weakness and headaches.  Psychiatric/Behavioral: Negative for behavioral problems and confusion.       Objective:   Physical Exam Vitals reviewed.  Constitutional:      General: He is not in acute distress. HENT:     Head: Normocephalic and atraumatic.  Eyes:     General:        Right eye: No discharge.        Left eye: No discharge.  Neck:     Trachea: No tracheal deviation.  Cardiovascular:     Rate and Rhythm: Normal rate and regular rhythm.     Heart sounds: Normal heart sounds. No murmur.  Pulmonary:     Effort: Pulmonary effort is normal. No respiratory distress.     Breath sounds: Normal breath sounds.  Lymphadenopathy:     Cervical: No cervical adenopathy.  Skin:    General: Skin is warm and dry.  Neurological:     Mental Status: He is alert.   Coordination: Coordination normal.  Psychiatric:        Behavior: Behavior normal.    Fall Risk  05/30/2019 11/09/2017 11/09/2017 11/06/2016 11/01/2015  Falls in the past year? 0 No No No No  Follow up Falls evaluation completed - - - -    Patient also complains of intermittent upper back muscle aches and neck discomforts that come and go No adenopathy under his arms or around his neck He has decent range of motion of his neck nontender trapezius    Assessment & Plan:  Significant clearing of the throat along with persistent tickle cough ENT evaluation negative Switch PPI If ongoing troubles next step would be referral to pulmonary for further evaluation I doubt patient would need a EGD but certainly if has ongoing troubles or any dysphagia referral to GI as well patient to follow-up here in 1 month

## 2019-10-06 DIAGNOSIS — M9903 Segmental and somatic dysfunction of lumbar region: Secondary | ICD-10-CM | POA: Diagnosis not present

## 2019-10-06 DIAGNOSIS — S134XXA Sprain of ligaments of cervical spine, initial encounter: Secondary | ICD-10-CM | POA: Diagnosis not present

## 2019-10-06 DIAGNOSIS — S233XXA Sprain of ligaments of thoracic spine, initial encounter: Secondary | ICD-10-CM | POA: Diagnosis not present

## 2019-10-06 DIAGNOSIS — M9902 Segmental and somatic dysfunction of thoracic region: Secondary | ICD-10-CM | POA: Diagnosis not present

## 2019-10-06 DIAGNOSIS — M9901 Segmental and somatic dysfunction of cervical region: Secondary | ICD-10-CM | POA: Diagnosis not present

## 2019-10-10 DIAGNOSIS — M9901 Segmental and somatic dysfunction of cervical region: Secondary | ICD-10-CM | POA: Diagnosis not present

## 2019-10-10 DIAGNOSIS — S233XXA Sprain of ligaments of thoracic spine, initial encounter: Secondary | ICD-10-CM | POA: Diagnosis not present

## 2019-10-10 DIAGNOSIS — M9903 Segmental and somatic dysfunction of lumbar region: Secondary | ICD-10-CM | POA: Diagnosis not present

## 2019-10-10 DIAGNOSIS — M9902 Segmental and somatic dysfunction of thoracic region: Secondary | ICD-10-CM | POA: Diagnosis not present

## 2019-10-10 DIAGNOSIS — S134XXA Sprain of ligaments of cervical spine, initial encounter: Secondary | ICD-10-CM | POA: Diagnosis not present

## 2019-10-15 DIAGNOSIS — S134XXA Sprain of ligaments of cervical spine, initial encounter: Secondary | ICD-10-CM | POA: Diagnosis not present

## 2019-10-15 DIAGNOSIS — M9902 Segmental and somatic dysfunction of thoracic region: Secondary | ICD-10-CM | POA: Diagnosis not present

## 2019-10-15 DIAGNOSIS — M9903 Segmental and somatic dysfunction of lumbar region: Secondary | ICD-10-CM | POA: Diagnosis not present

## 2019-10-15 DIAGNOSIS — S233XXA Sprain of ligaments of thoracic spine, initial encounter: Secondary | ICD-10-CM | POA: Diagnosis not present

## 2019-10-15 DIAGNOSIS — M9901 Segmental and somatic dysfunction of cervical region: Secondary | ICD-10-CM | POA: Diagnosis not present

## 2019-10-31 ENCOUNTER — Encounter: Payer: Self-pay | Admitting: Family Medicine

## 2019-10-31 ENCOUNTER — Other Ambulatory Visit: Payer: Self-pay

## 2019-10-31 ENCOUNTER — Ambulatory Visit (INDEPENDENT_AMBULATORY_CARE_PROVIDER_SITE_OTHER): Payer: Medicare Other | Admitting: Family Medicine

## 2019-10-31 VITALS — BP 130/82 | Temp 97.6°F | Wt 162.6 lb

## 2019-10-31 DIAGNOSIS — E785 Hyperlipidemia, unspecified: Secondary | ICD-10-CM

## 2019-10-31 DIAGNOSIS — M7918 Myalgia, other site: Secondary | ICD-10-CM | POA: Diagnosis not present

## 2019-10-31 DIAGNOSIS — K219 Gastro-esophageal reflux disease without esophagitis: Secondary | ICD-10-CM

## 2019-10-31 DIAGNOSIS — I10 Essential (primary) hypertension: Secondary | ICD-10-CM | POA: Diagnosis not present

## 2019-10-31 DIAGNOSIS — R6889 Other general symptoms and signs: Secondary | ICD-10-CM

## 2019-10-31 DIAGNOSIS — Z79899 Other long term (current) drug therapy: Secondary | ICD-10-CM

## 2019-10-31 DIAGNOSIS — R0989 Other specified symptoms and signs involving the circulatory and respiratory systems: Secondary | ICD-10-CM

## 2019-10-31 NOTE — Progress Notes (Signed)
Subjective:    Patient ID: David Pham, male    DOB: [birth year], 69 y.o.   MRN: [id number]  HPI Pt here today for 4 week follow up on GERD. Pt was seen on 09/29/19 due to hoarseness. Pt states he did take Protonix for a couple days and was not helping his acid reflux. Pt states he went back to Omeprazole. Pt is having to continually clear throat. Pt states he is not having as many issues with swallowing at this time. Very nice gentleman has multiple issues States he is having problems with postnasal drainage and constant clearing of his throat Denies any hemoptysis wheezing or difficulty breathing Denies chest pressure or pain Patient does not smoke Patient has personal history of reflux and states the Protonix did not work as well He went back to the omeprazole and states that it has been under good control for him. Pt states his middle back is now continuing to stay sore all day. Is taking Tylenol to help Fall Risk  09/29/2019 05/30/2019 11/09/2017 11/09/2017 11/06/2016  Falls in the past year? 0 0 No No No  Follow up Falls evaluation completed Falls evaluation completed - - -    Pt is wanting to know if he should start taking a baby aspirin daily.  All hello hi there I am areas are so up okay [facility] and the new acid blocker it just did not help her get heartburn and okay Review of Systems  Constitutional: Negative for diaphoresis and fatigue.  HENT: Negative for congestion and rhinorrhea.   Respiratory: Negative for cough and shortness of breath.   Cardiovascular: Negative for chest pain and leg swelling.  Gastrointestinal: Negative for abdominal pain and diarrhea.  Skin: Negative for color change and rash.  Neurological: Negative for dizziness and headaches.  Psychiatric/Behavioral: Negative for behavioral problems and confusion.       Objective:   Physical Exam  Lungs are clear respiratory rate normal heart regular no murmurs extremities no edema skin warm dry neurologic  grossly normal      Assessment & Plan:  1. Chronic throat clearing I did my best to explain to the patient that trying to minimize the amount of clearing his throat could potentially slow down how often he is having to do this.  He has already seen ENT and they stated that the vocal cords look good they recommended for him to stay on his PPI.  He has not seen pulmonary but defers on doing so currently  2. Rhomboid pain Mild rhomboid pain present over the past week with some activity no numbness or tingling into the arms I instructed him to do massage also warm compresses or ice as he wishes and this should gradually improve over the course of the next week or 2  3. Essential hypertension Blood pressure under good control continue current measures. - Lipid Profile - Basic Metabolic Panel (BMET) - Hepatic function panel  4. Hyperlipidemia, unspecified hyperlipidemia type Cholesterol needs to get done he will do the lab work soon. - Lipid Profile - Basic Metabolic Panel (BMET) - Hepatic function panel  5. Gastroesophageal reflux disease without esophagitis No dysphagia currently.  Continue omeprazole.  6. High risk medication use Check labs - Lipid Profile - Basic Metabolic Panel (BMET) - Hepatic function panel  Very nice patient Has ongoing issues with chronic throat clearing which concerns him I explained to him the multifactorial aspect of chronic throat clearing including postnasal drip and reflux and potential reactive airway  I doubt reactive airway but if he continues with this the next step would be pulmonary consultation patient defers on this currently hold off on PFT 30 minutes spent with patient discussing the multiple aspects reviewing over the results and review of the ENT specialist plus also discussing with him the nature of this problem plus documentation

## 2019-11-13 ENCOUNTER — Other Ambulatory Visit: Payer: Self-pay

## 2019-11-13 ENCOUNTER — Ambulatory Visit (INDEPENDENT_AMBULATORY_CARE_PROVIDER_SITE_OTHER): Payer: Medicare Other | Admitting: Family Medicine

## 2019-11-13 ENCOUNTER — Encounter: Payer: Self-pay | Admitting: Family Medicine

## 2019-11-13 VITALS — BP 132/80 | Temp 97.6°F | Wt 162.0 lb

## 2019-11-13 DIAGNOSIS — I10 Essential (primary) hypertension: Secondary | ICD-10-CM

## 2019-11-13 DIAGNOSIS — R6889 Other general symptoms and signs: Secondary | ICD-10-CM | POA: Diagnosis not present

## 2019-11-13 DIAGNOSIS — R05 Cough: Secondary | ICD-10-CM

## 2019-11-13 DIAGNOSIS — R0989 Other specified symptoms and signs involving the circulatory and respiratory systems: Secondary | ICD-10-CM

## 2019-11-13 DIAGNOSIS — R053 Chronic cough: Secondary | ICD-10-CM

## 2019-11-13 MED ORDER — AMLODIPINE BESYLATE 2.5 MG PO TABS: 2.5000 mg | ORAL_TABLET | Freq: Every day | ORAL | 1 refills | Status: DC

## 2019-11-13 NOTE — Progress Notes (Addendum)
   Subjective:    Patient ID: David Pham, male    DOB: 10/24/1950, 69 y.o.   MRN: [id number]  Cough Chronicity: off and on for months. Pertinent negatives include no chest pain, fever, headaches, rhinorrhea or shortness of breath. Associated symptoms comments: Sinus drainage. Treatments tried: tylenol, nasal spray, allergy med.  He is tried various allergy meds but he states her using for a few days then stopped taking them so that he does not use to them Upper back pain. Feels pain when he bends he his down.  Significant soreness in the upper back in the right rhomboid trapezius region does not radiate down the arm  Review of Systems  Constitutional: Negative for activity change, fatigue and fever.  HENT: Negative for congestion and rhinorrhea.   Respiratory: Positive for cough. Negative for shortness of breath.   Cardiovascular: Negative for chest pain and leg swelling.  Gastrointestinal: Negative for abdominal pain, diarrhea and nausea.  Genitourinary: Negative for dysuria and hematuria.  Neurological: Negative for weakness and headaches.  Psychiatric/Behavioral: Negative for agitation and behavioral problems.       Objective:   Physical Exam Vitals reviewed.  Constitutional:      General: He is not in acute distress. HENT:     Head: Normocephalic and atraumatic.  Eyes:     General:        Right eye: No discharge.        Left eye: No discharge.  Neck:     Trachea: No tracheal deviation.  Cardiovascular:     Rate and Rhythm: Normal rate and regular rhythm.     Heart sounds: Normal heart sounds. No murmur.  Pulmonary:     Effort: Pulmonary effort is normal. No respiratory distress.     Breath sounds: Normal breath sounds.  Lymphadenopathy:     Cervical: No cervical adenopathy.  Skin:    General: Skin is warm and dry.  Neurological:     Mental Status: He is alert.     Coordination: Coordination normal.  Psychiatric:        Behavior: Behavior normal.     Tenderness upper trapezius right side and rhomboid right side good range of motion of the arm  Fall Risk  11/13/2019 10/31/2019 09/29/2019 05/30/2019 11/09/2017  Falls in the past year? 0 0 0 0 No  Follow up - Falls evaluation completed Falls evaluation completed Falls evaluation completed -        Assessment & Plan:  1. Chronic cough I believe this is a combination of chronic throat clearing plus postnasal drainage I recommend use of Flonase on a regular basis and allergy tablet referral to asthma allergy doctor to see if other measures could be tried I doubt that this is a sign of any type of severe pulmonary issue such as lung cancer patient had chest x-ray back in November which looked good he is not a smoker - Ambulatory referral to Allergy  2. Chronic throat clearing Patient wonders if he could be allergic to formaldehyde or other things within his house more than likely this is just seasonal along with chronic postnasal drip he has already seen ENT which evaluated his larynx he is already seeing GI I doubt that this is a pulmonary issue in regards to pulmonologist it is reasonable to go ahead with referral to allergist - Ambulatory referral to Allergy  3. Essential hypertension Blood pressure good control continue current measures follow-up later this year for wellness

## 2019-11-19 ENCOUNTER — Encounter: Payer: Self-pay | Admitting: Family Medicine

## 2019-11-28 ENCOUNTER — Ambulatory Visit (INDEPENDENT_AMBULATORY_CARE_PROVIDER_SITE_OTHER): Payer: Medicare Other | Admitting: Allergy & Immunology

## 2019-11-28 ENCOUNTER — Encounter: Payer: Self-pay | Admitting: Allergy & Immunology

## 2019-11-28 ENCOUNTER — Other Ambulatory Visit: Payer: Self-pay

## 2019-11-28 VITALS — BP 130/82 | HR 72 | Temp 98.5°F | Resp 18 | Ht 68.0 in | Wt 162.4 lb

## 2019-11-28 DIAGNOSIS — J3089 Other allergic rhinitis: Secondary | ICD-10-CM

## 2019-11-28 DIAGNOSIS — J302 Other seasonal allergic rhinitis: Secondary | ICD-10-CM | POA: Diagnosis not present

## 2019-11-28 DIAGNOSIS — T63481D Toxic effect of venom of other arthropod, accidental (unintentional), subsequent encounter: Secondary | ICD-10-CM

## 2019-11-28 NOTE — Progress Notes (Signed)
NEW PATIENT  Date of Service/Encounter:  11/28/19  Referring provider: Kathyrn Drown, MD   Assessment:   Seasonal and perennial allergic rhinitis (grasses, trees and indoor molds)  Insect sting allergy  GERD - on PPI   Mr. Koehnke presents for evaluation of postnasal drip and throat clearing.  After the testing, I am not convinced that he is very clearing has a lot to do with environmental allergies, as there is nothing on the testing that is present during the winter.  His indoor molds were ever so slightly reactive on intradermal's only.  Grasses and trees were reactive on prick testing, but these are obviously absent during the winter months.  Regardless, we are going to change his regimen around slightly to see if we can help with his postnasal drip and throat clearing.  We are going to see him back in 6 weeks to see how things are going.  Plan/Recommendations:    1. Insect sting allergy (yellowjackets) - No testing is needed since this was only a localized reaction. - EpiPen no necessarily indicated  2. Chronic rhinitis - Testing today showed: grasses, trees and indoor molds - Copy of test results provided.  - Avoidance measures provided. - Continue with: Flonase (fluticasone) one spray per nostril daily in conjunction with nasal saline rinses - Start taking: alternating antihistamines (change every month) and Ryvent (carbinoxamine) 6mg  at night (samples provided).  - You can use an extra dose of the antihistamine, if needed, for breakthrough symptoms.  - Consider nasal saline rinses 1-2 times daily to remove allergens from the nasal cavities as well as help with mucous clearance (this is especially helpful to do before the nasal sprays are given)  3. Follow up in 4-6 weeks or earlier if needed. This can be an in-person, a virtual Webex or a telephone follow up visit.   Subjective:   KOAH HINCHEE is a 69 y.o. male presenting today for evaluation of  Chief  Complaint  Patient presents with  . Throat Clearing    patient had complaints of chronic throat clearing. ongoing since last August. after he eats he notices it more. he says he has a tickle in his chest and then he coughs a dry/funny cough. his PCP gave him antibiotics which helped a small amount.     Clyda Greener has a history of the following: Patient Active Problem List   Diagnosis Date Noted  . Seasonal and perennial allergic rhinitis 11/28/2019  . Insect sting allergy 11/28/2019  . Chronic throat clearing 09/29/2019  . Mass of right knee 02/21/2017  . HTN (hypertension) 11/06/2016  . Left shoulder tendinitis 11/06/2016  . Spondylolisthesis at L5-S1 level 09/23/2015  . History of prostatectomy 05/11/2014  . BACK PAIN 09/10/2008  . ELEVATED PROSTATE SPECIFIC ANTIGEN 07/06/2008  . COLONIC POLYPS 06/01/2008  . TINNITUS, CHRONIC, BILATERAL 06/01/2008  . Allergic rhinitis 06/01/2008  . PNEUMONIA, LEFT 06/01/2008  . DEGENERATIVE JOINT DISEASE 06/01/2008  . ESOPHAGEAL REFLUX 11/29/2007  . DYSPHAGIA UNSPECIFIED 10/28/2007    History obtained from: chart review and patient.  Clyda Greener was referred by Kathyrn Drown, MD.     Ho is a 69 y.o. male presenting for an evaluation of throat clearing. He reports a lot of nasal congestion. He has had throat clearing off and on for years, but it seems to have gotten worse. He is unsure what is causing it and he has been to ENT. He had a CXR to look for pneumonia which  was negative. He went through three different antibiotics in 2020 in the fall to try to clear this up. The ENT was in [place] and it was located on [address]. Per his PCP note, it was recommended that he remain on his PPI.  He tells [place] he has been on a PPI for years.  He has never been allergy tested in the past.  Aside from being outdoors in the land, there are no exacerbating environments including animals or dust.   He is constantly using nose spray and  allergy medication.  Currently he is on Allegra but he has tried Zyrtec and Claritin in the past.  He does have fluticasone on his list of medications.  There is one no spray, presumably nasal ipratropium, that made things worse.  He does have what sounds like a large local reaction to yellowjacket stings.  He has never had an EpiPen. He has had moles removed on his back.   His atopic history is otherwise negative.  As well as otherwise, there is no history of other atopic diseases, including asthma, food allergies, drug allergies, stinging insect allergies, eczema, urticaria or contact dermatitis. There is no significant infectious history. Vaccinations are up to date.    Past Medical History: Patient Active Problem List   Diagnosis Date Noted  . Seasonal and perennial allergic rhinitis 11/28/2019  . Insect sting allergy 11/28/2019  . Chronic throat clearing 09/29/2019  . Mass of right knee 02/21/2017  . HTN (hypertension) 11/06/2016  . Left shoulder tendinitis 11/06/2016  . Spondylolisthesis at L5-S1 level 09/23/2015  . History of prostatectomy 05/11/2014  . BACK PAIN 09/10/2008  . ELEVATED PROSTATE SPECIFIC ANTIGEN 07/06/2008  . COLONIC POLYPS 06/01/2008  . TINNITUS, CHRONIC, BILATERAL 06/01/2008  . Allergic rhinitis 06/01/2008  . PNEUMONIA, LEFT 06/01/2008  . DEGENERATIVE JOINT DISEASE 06/01/2008  . ESOPHAGEAL REFLUX 11/29/2007  . DYSPHAGIA UNSPECIFIED 10/28/2007    Medication List:  Allergies as of 11/28/2019      Reactions   Codeine    Morphine And Related    Penicillins       Medication List       Accurate as of Nov 28, 2019  1:01 PM. If you have any questions, ask your nurse or doctor.        acetaminophen 500 MG tablet Commonly known as: TYLENOL Take 1,000 mg by mouth at bedtime as needed.   amLODipine 2.5 MG tablet Commonly known as: NORVASC Take 1 tablet (2.5 mg total) by mouth daily.   azelastine 0.05 % ophthalmic solution Commonly known as:  OPTIVAR Apply 1 drop to eye 2 (two) times daily.   cholecalciferol 1000 units tablet Commonly known as: VITAMIN D Take 1,000 Units by mouth daily.   fexofenadine 180 MG tablet Commonly known as: ALLEGRA Take one tablet by mouth daily   fluticasone 50 MCG/ACT nasal spray Commonly known as: FLONASE Place 2 sprays into both nostrils daily.   omeprazole 20 MG capsule Commonly known as: PRILOSEC TAKE 1 CAPSULE BY MOUTH EVERY DAY   REFRESH OP Apply to eye.   Vitamin B 12 100 MCG Lozg Take 1,000 mg by mouth.       Birth History: non-contributory  Developmental History: non-contributory  Past Surgical History: Past Surgical History:  Procedure Laterality Date  . COLONOSCOPY  2006 and 2012  . COLONOSCOPY  08/04/2010  . PROSTATECTOMY     2011     Family History: Family History  Problem Relation Age of Onset  . Cancer  Sister        breast  . Hypertension Brother   . Diabetes Brother   . Emphysema Mother   . Asthma Father   . Allergic rhinitis Father   . Angioedema Neg Hx   . Atopy Neg Hx   . Eczema Neg Hx   . Immunodeficiency Neg Hx   . Urticaria Neg Hx      Social History: Zamarius lives in a house that is 69 years old.  There is carpeting in the main living areas and hardwoods in the bedroom.  He has electric heating and heat pump for cooling.  There are no animals inside or outside of home.  He does not have dust mite covers on the bedding.  There is no tobacco exposure.  He currently works as a Dance movement psychotherapist.   Review of Systems  Constitutional: Negative.  Negative for fever, malaise/fatigue and weight loss.  HENT: Positive for congestion. Negative for ear discharge, ear pain and sinus pain.        Positive for postnasal drip.  Eyes: Negative for pain, discharge and redness.  Respiratory: Negative for cough, sputum production, shortness of breath and wheezing.   Cardiovascular: Negative.  Negative for chest pain and palpitations.  Gastrointestinal:  Negative for abdominal pain, constipation, diarrhea, heartburn, nausea and vomiting.  Skin: Negative.  Negative for itching and rash.  Neurological: Negative for dizziness and headaches.  Endo/Heme/Allergies: Negative for environmental allergies. Does not bruise/bleed easily.       Objective:   Blood pressure 130/82, pulse 72, temperature 98.5 F (36.9 C), temperature source Temporal, resp. rate 18, height 5\' 8"  (1.727 m), weight 162 lb 6.4 oz (73.7 kg), SpO2 97 %. Body mass index is 24.69 kg/m.   Physical Exam:   Physical Exam  Constitutional: He appears well-developed and well-nourished.  Very talkative male.   HENT:  Head: Normocephalic and atraumatic.  Right Ear: Tympanic membrane, external ear and ear canal normal. No drainage, swelling or tenderness. Tympanic membrane is not injected, not scarred, not erythematous, not retracted and not bulging.  Left Ear: Tympanic membrane, external ear and ear canal normal. No drainage, swelling or tenderness. Tympanic membrane is not injected, not scarred, not erythematous, not retracted and not bulging.  Nose: Mucosal edema present. No rhinorrhea, nasal deformity or septal deviation. No epistaxis. Right sinus exhibits no maxillary sinus tenderness and no frontal sinus tenderness. Left sinus exhibits no maxillary sinus tenderness and no frontal sinus tenderness.  Mouth/Throat: Uvula is midline and oropharynx is clear and moist. Mucous membranes are not pale and not dry.  No polyps appreciated.   Eyes: Pupils are equal, round, and reactive to light. Conjunctivae and EOM are normal. Right eye exhibits no chemosis and no discharge. Left eye exhibits no chemosis and no discharge. Right conjunctiva is not injected. Left conjunctiva is not injected.  Cardiovascular: Normal rate, regular rhythm and normal heart sounds.  Respiratory: Effort normal and breath sounds normal. No accessory muscle usage. No tachypnea. No respiratory distress. He has no  wheezes. He has no rhonchi. He has no rales. He exhibits no tenderness.  Moving air well in all lung fields. No increased work of breathing noted.   GI: There is no abdominal tenderness. There is no rebound and no guarding.  Lymphadenopathy:       Head (right side): No submandibular, no tonsillar and no occipital adenopathy present.       Head (left side): No submandibular, no tonsillar and no occipital adenopathy present.  He has no cervical adenopathy.  Neurological: He is alert.  Skin: No abrasion, no petechiae and no rash noted. Rash is not papular, not vesicular and not urticarial. No erythema. No pallor.  He does have some scarring on his back from previous nevi removals.   Psychiatric: He has a normal mood and affect.     Diagnostic studies:     Allergy Studies:    Airborne Adult Perc - 11/28/19 0954    Time Antigen Placed  [id number]    Allergen Manufacturer  Lavella Hammock    Location  Back    Number of Test  59    Panel 1  Select    1. Control-Buffer 50% Glycerol  Negative    2. Control-Histamine 1 mg/ml  2+    3. Albumin saline  Negative    4. Brashear  Negative    5. [place]  Negative    6. Johnson  2+    7. [place] Blue  2+    8. Meadow Fescue  --   +/-   9. Perennial Rye  2+    10. Sweet Vernal  2+    11. Timothy  2+    12. Cocklebur  Negative    13. Burweed Marshelder  Negative    14. Ragweed, short  Negative    15. Ragweed, Giant  Negative    16. Plantain,  English  Negative    17. Lamb's Quarters  Negative    18. Sheep Sorrell  Negative    19. Rough Pigweed  Negative    20. Marsh Elder, Rough  Negative    21. Mugwort, Common  Negative    22. Ash mix  Negative    23. Wendee Copp mix  --   +/-   24. Beech American  Negative    25. Box, Elder  Negative    26. Cedar, red  Negative    27. Cottonwood, [place]  Negative    28. Elm mix  Negative    29. Hickory  Negative    30. Maple mix  Negative    31. Oak, [place] mix  2+    32. Pecan Pollen  Negative    33. Pine mix   Negative    34. Sycamore Eastern  Negative    35. Yellow Pine, Black Pollen  Negative    36. Alternaria alternata  Negative    37. Cladosporium Herbarum  Negative    38. Aspergillus mix  Negative    39. Penicillium mix  Negative    40. Bipolaris sorokiniana (Helminthosporium)  Negative    41. Drechslera spicifera (Curvularia)  Negative    42. Mucor plumbeus  Negative    43. Fusarium moniliforme  Negative    44. Aureobasidium pullulans (pullulara)  Negative    45. Rhizopus oryzae  Negative    46. Botrytis cinera  Negative    47. Epicoccum nigrum  Negative    48. Phoma betae  Negative    49. Candida Albicans  Negative    50. Trichophyton mentagrophytes  Negative    51. Mite, D Farinae  5,000 AU/ml  Negative    52. Mite, D Pteronyssinus  5,000 AU/ml  Negative    53. Cat Hair 10,000 BAU/ml  Negative    54.  Dog Epithelia  Negative    55. Mixed Feathers  Negative    56. Horse Epithelia  Negative    57. Cockroach, German  Negative    58. Mouse  Negative    59. Tobacco  Leaf  Negative     Intradermal - 11/28/19 1000    Time Antigen Placed  1030    Allergen Manufacturer  Lavella Hammock    Location  Arm    Number of Test  12    Control  Negative    Johnson  Negative    Ragweed mix  Negative    Weed mix  Negative    Mold 1  Negative    Mold 2  Negative    Mold 3  Negative    Mold 4  1+    Cat  Negative    Dog  Negative    Cockroach  Negative    Mite mix  Negative       Allergy testing results were read and interpreted by myself, documented by clinical staff.         Salvatore Marvel, MD [facility]

## 2019-11-28 NOTE — Patient Instructions (Addendum)
1. Insect sting allergy (yellowjackets) - No testing is needed since this was only a localized reaction. - EpiPen no necessarily indicated  2. Chronic rhinitis - Testing today showed: grasses, trees and indoor molds - Copy of test results provided.  - Avoidance measures provided. - Continue with: Flonase (fluticasone) one spray per nostril daily in conjunction with nasal saline rinses - Start taking: alternating antihistamines (change every month) and Ryvent (carbinoxamine) 6mg  at night (samples provided).  - You can use an extra dose of the antihistamine, if needed, for breakthrough symptoms.  - Consider nasal saline rinses 1-2 times daily to remove allergens from the nasal cavities as well as help with mucous clearance (this is especially helpful to do before the nasal sprays are given)  3. Return in about 6 weeks (around 01/09/2020). This can be an in-person, a virtual Webex or a telephone follow up visit.   Please inform us of any Emergency Department visits, hospitalizations, or changes in symptoms. Call us before going to the ED for breathing or allergy symptoms since we might be able to fit you in for a sick visit. Feel free to contact us anytime with any questions, problems, or concerns.  It was a pleasure to meet you today!  Websites that have reliable patient information: 1. American Academy of Asthma, Allergy, and Immunology: www.aaaai.org 2. Food Allergy Research and Education (FARE): foodallergy.org 3. Mothers of Asthmatics: http://www.asthmacommunitynetwork.org 4. American College of Allergy, Asthma, and Immunology: www.acaai.org   COVID-19 Vaccine Information can be found at: ShippingScam.co.uk For questions related to vaccine distribution or appointments, please email [email or web address] or call [phone].     "Like" [place] on Facebook and Instagram for our latest updates!       HAPPY SPRING!  Make sure  you are registered to vote! If you have moved or changed any of your contact information, you will need to get this updated before voting!  In some cases, you MAY be able to register to vote online: [email or web address]     Reducing Pollen Exposure  The American Academy of Allergy, Asthma and Immunology suggests the following steps to reduce your exposure to pollen during allergy seasons.    1. Do not hang sheets or clothing out to dry; pollen may collect on these items. 2. Do not mow lawns or spend time around freshly cut grass; mowing stirs up pollen. 3. Keep windows closed at night.  Keep car windows closed while driving. 4. Minimize morning activities outdoors, a time when pollen counts are usually at their highest. 5. Stay indoors as much as possible when pollen counts or humidity is high and on windy days when pollen tends to remain in the air longer. 6. Use air conditioning when possible.  Many air conditioners have filters that trap the pollen spores. 7. Use a HEPA room air filter to remove pollen form the indoor air you breathe.  Control of Mold Allergen   Mold and fungi can grow on a variety of surfaces provided certain temperature and moisture conditions exist.  Outdoor molds grow on plants, decaying vegetation and soil.  The major outdoor mold, Alternaria and Cladosporium, are found in very high numbers during hot and dry conditions.  Generally, a late Summer - Fall peak is seen for common outdoor fungal spores.  Rain will temporarily lower outdoor mold spore count, but counts rise rapidly when the rainy period ends.  The most important indoor molds are Aspergillus and Penicillium.  Dark, humid and poorly ventilated basements are ideal sites for mold  growth.  The next most common sites of mold growth are the bathroom and the kitchen.   Indoor (Perennial) Mold Control   Positive indoor molds via skin testing: Fusarium, Aureobasidium (Pullulara) and  Rhizopus  1. Maintain humidity below 50%. 2. Clean washable surfaces with 5% bleach solution. 3. Remove sources e.g. contaminated carpets.   You can buy saline nose drops at a pharmacy, or you can make your own saline solution:  1. Add 1 cup (240 mL) distilled water to a clean container. If you use tap water, boil it first to sterilize it, and then let it cool until it is lukewarm.  2. Add 0.5 tsp (2.5 g) salt to the water. 3. Add 0.5 tsp (2.5 g) baking soda.

## 2019-12-21 ENCOUNTER — Other Ambulatory Visit: Payer: Self-pay | Admitting: Family Medicine

## 2019-12-23 ENCOUNTER — Telehealth: Payer: Self-pay | Admitting: Family Medicine

## 2019-12-24 ENCOUNTER — Other Ambulatory Visit: Payer: Self-pay

## 2019-12-24 ENCOUNTER — Ambulatory Visit (INDEPENDENT_AMBULATORY_CARE_PROVIDER_SITE_OTHER): Payer: Medicare Other | Admitting: Family Medicine

## 2019-12-24 ENCOUNTER — Encounter: Payer: Self-pay | Admitting: Family Medicine

## 2019-12-24 VITALS — BP 118/80 | Temp 97.1°F | Ht 68.0 in | Wt 162.0 lb

## 2019-12-24 DIAGNOSIS — D229 Melanocytic nevi, unspecified: Secondary | ICD-10-CM | POA: Diagnosis not present

## 2019-12-24 DIAGNOSIS — M25512 Pain in left shoulder: Secondary | ICD-10-CM

## 2019-12-24 DIAGNOSIS — M7582 Other shoulder lesions, left shoulder: Secondary | ICD-10-CM

## 2019-12-24 DIAGNOSIS — I10 Essential (primary) hypertension: Secondary | ICD-10-CM | POA: Diagnosis not present

## 2019-12-24 MED ORDER — TRIAMCINOLONE ACETONIDE 0.1 % EX CREA: TOPICAL_CREAM | CUTANEOUS | 1 refills | Status: DC

## 2019-12-24 NOTE — Telephone Encounter (Signed)
Please close patient decided to schedule appointment for 6/2

## 2019-12-24 NOTE — Progress Notes (Signed)
Subjective:    Patient ID: David Pham, male    DOB: Sep 07, 1950, 69 y.o.   MRN: [id number]  Hypertension This is a chronic problem. Pertinent negatives include no chest pain, headaches or shortness of breath. Treatments tried: norvasc. There are no compliance problems (takes meds every day, walk about one mile a day).    Right pointer finger soreness for about one to two months.  The patient relates that area on the finger is somewhat sore in itching is been going on for a few weeks  Patient also relates lower left shoulder pain discomfort with certain movements and rotations especially when he plays golf.  He states as a result of this he would like to consider getting a shot he has had this once before and it did help.  Fall Risk  12/24/2019 11/13/2019 11/13/2019 10/31/2019 09/29/2019  Falls in the past year? 0 0 0 0 0  Follow up Falls evaluation completed Falls evaluation completed - Falls evaluation completed Falls evaluation completed    Review of Systems  Constitutional: Negative for diaphoresis and fatigue.  HENT: Negative for congestion and rhinorrhea.   Respiratory: Negative for cough and shortness of breath.   Cardiovascular: Negative for chest pain and leg swelling.  Gastrointestinal: Negative for abdominal pain and diarrhea.  Musculoskeletal: Positive for arthralgias and back pain.  Skin: Positive for rash. Negative for color change.  Neurological: Negative for dizziness and headaches.  Psychiatric/Behavioral: Negative for behavioral problems and confusion.       Objective:   Physical Exam Vitals reviewed.  Constitutional:      General: He is not in acute distress. HENT:     Head: Normocephalic and atraumatic.  Eyes:     General:        Right eye: No discharge.        Left eye: No discharge.  Neck:     Trachea: No tracheal deviation.  Cardiovascular:     Rate and Rhythm: Normal rate and regular rhythm.     Heart sounds: Normal heart sounds. No murmur.    Pulmonary:     Effort: Pulmonary effort is normal. No respiratory distress.     Breath sounds: Normal breath sounds.  Lymphadenopathy:     Cervical: No cervical adenopathy.  Skin:    General: Skin is warm and dry.  Neurological:     Mental Status: He is alert.     Coordination: Coordination normal.  Psychiatric:        Behavior: Behavior normal.     Shoulder has discomfort with anterior movement also with trying to go behind his back and over his head consistent with tendinitis no muscle tears detected  Finger has excoriated area on the index finger that appears to be a contact dermatitis unlikely to be a skin cancer  Area on his back has what appears to be a seborrheic keratosis but it has a very black area with irregular border that I really feel it is best for him to see dermatology    Assessment & Plan:  1. Tendinitis of left rotator cuff I believe the patient has tendinitis but I cannot rule out the possibility of AC arthritis.  We will go ahead and get an x-ray.  Also injection was done with his consent with Depo-Medrol 40 mg, 2 cc lidocaine, patient tolerated procedure well, sterile technique, anterior approach  2. Essential hypertension Blood pressure under good control continue current measures  3. Atypical nevi Atypical nevi on the back referral to  Dr. Juel Burrow office patient prefers Friday mornings - Ambulatory referral to Dermatology  4. Left shoulder pain, unspecified chronicity Range of motion exercises were shown to the patient await the x-ray - DG Shoulder Left  Patient also has a dermatitis on his index finger does not appear to be a skin cancer I would recommend triamcinolone if it does not clear up within 2 weeks then consult with dermatology  Patient has wellness physical in July

## 2020-01-09 ENCOUNTER — Ambulatory Visit: Payer: Medicare Other | Admitting: Allergy & Immunology

## 2020-01-09 DIAGNOSIS — I10 Essential (primary) hypertension: Secondary | ICD-10-CM | POA: Diagnosis not present

## 2020-01-09 DIAGNOSIS — Z79899 Other long term (current) drug therapy: Secondary | ICD-10-CM | POA: Diagnosis not present

## 2020-01-09 DIAGNOSIS — E785 Hyperlipidemia, unspecified: Secondary | ICD-10-CM | POA: Diagnosis not present

## 2020-01-10 ENCOUNTER — Encounter: Payer: Self-pay | Admitting: Family Medicine

## 2020-01-10 LAB — LIPID PANEL
Chol/HDL Ratio: 3.4 ratio (ref 0.0–5.0)
Cholesterol, Total: 178 mg/dL (ref 100–199)
HDL: 52 mg/dL (ref >39)
LDL Chol Calc (NIH): 109 mg/dL — ABNORMAL HIGH (ref 0–99)
Triglycerides: 95 mg/dL (ref 0–149)
VLDL Cholesterol Cal: 17 mg/dL (ref 5–40)

## 2020-01-10 LAB — BASIC METABOLIC PANEL
BUN/Creatinine Ratio: 11 (ref 10–24)
BUN: 9 mg/dL (ref 8–27)
CO2: 23 mmol/L (ref 20–29)
Calcium: 9.3 mg/dL (ref 8.6–10.2)
Chloride: 105 mmol/L (ref 96–106)
Creatinine, Ser: 0.85 mg/dL (ref 0.76–1.27)
GFR calc Af Amer: 103 mL/min/{1.73_m2} (ref >59)
GFR calc non Af Amer: 89 mL/min/{1.73_m2} (ref >59)
Glucose: 95 mg/dL (ref 65–99)
Potassium: 4 mmol/L (ref 3.5–5.2)
Sodium: 140 mmol/L (ref 134–144)

## 2020-01-10 LAB — HEPATIC FUNCTION PANEL
ALT: 13 IU/L (ref 0–44)
AST: 18 IU/L (ref 0–40)
Albumin: 4.3 g/dL (ref 3.8–4.8)
Alkaline Phosphatase: 79 IU/L (ref 48–121)
Bilirubin Total: 1.1 mg/dL (ref 0.0–1.2)
Bilirubin, Direct: 0.22 mg/dL (ref 0.00–0.40)
Total Protein: 6.9 g/dL (ref 6.0–8.5)

## 2020-01-12 DIAGNOSIS — Z1283 Encounter for screening for malignant neoplasm of skin: Secondary | ICD-10-CM | POA: Diagnosis not present

## 2020-01-12 DIAGNOSIS — Z08 Encounter for follow-up examination after completed treatment for malignant neoplasm: Secondary | ICD-10-CM | POA: Diagnosis not present

## 2020-01-12 DIAGNOSIS — R208 Other disturbances of skin sensation: Secondary | ICD-10-CM | POA: Diagnosis not present

## 2020-01-12 DIAGNOSIS — Z85828 Personal history of other malignant neoplasm of skin: Secondary | ICD-10-CM | POA: Diagnosis not present

## 2020-01-12 DIAGNOSIS — D225 Melanocytic nevi of trunk: Secondary | ICD-10-CM | POA: Diagnosis not present

## 2020-01-14 ENCOUNTER — Other Ambulatory Visit: Payer: Self-pay | Admitting: Family Medicine

## 2020-01-23 ENCOUNTER — Other Ambulatory Visit: Payer: Self-pay

## 2020-01-23 ENCOUNTER — Ambulatory Visit (INDEPENDENT_AMBULATORY_CARE_PROVIDER_SITE_OTHER): Payer: Medicare Other | Admitting: Family Medicine

## 2020-01-23 ENCOUNTER — Encounter: Payer: Self-pay | Admitting: Family Medicine

## 2020-01-23 VITALS — BP 116/76 | Temp 97.5°F | Ht 68.5 in | Wt 166.4 lb

## 2020-01-23 DIAGNOSIS — M25519 Pain in unspecified shoulder: Secondary | ICD-10-CM | POA: Diagnosis not present

## 2020-01-23 DIAGNOSIS — Z0000 Encounter for general adult medical examination without abnormal findings: Secondary | ICD-10-CM | POA: Diagnosis not present

## 2020-01-23 MED ORDER — ZOSTER VAC RECOMB ADJUVANTED 50 MCG/0.5ML IM SUSR
0.5000 mL | Freq: Once | INTRAMUSCULAR | 1 refills | Status: AC
Start: 2020-01-23 — End: 2020-01-23

## 2020-01-23 NOTE — Patient Instructions (Signed)
Results for orders placed or performed in visit on 10/31/19  Lipid Profile  Result Value Ref Range   Cholesterol, Total 178 100 - 199 mg/dL   Triglycerides 95 0 - 149 mg/dL   HDL 52 >39 mg/dL   VLDL Cholesterol Cal 17 5 - 40 mg/dL   LDL Chol Calc (NIH) 109 (H) 0 - 99 mg/dL   Chol/HDL Ratio 3.4 0.0 - 5.0 ratio  Basic Metabolic Panel (BMET)  Result Value Ref Range   Glucose 95 65 - 99 mg/dL   BUN 9 8 - 27 mg/dL   Creatinine, Ser 0.85 0.76 - 1.27 mg/dL   GFR calc non Af Amer 89 >59 mL/min/1.73   GFR calc Af Amer 103 >59 mL/min/1.73   BUN/Creatinine Ratio 11 10 - 24   Sodium 140 134 - 144 mmol/L   Potassium 4.0 3.5 - 5.2 mmol/L   Chloride 105 96 - 106 mmol/L   CO2 23 20 - 29 mmol/L   Calcium 9.3 8.6 - 10.2 mg/dL  Hepatic function panel  Result Value Ref Range   Total Protein 6.9 6.0 - 8.5 g/dL   Albumin 4.3 3.8 - 4.8 g/dL   Bilirubin Total 1.1 0.0 - 1.2 mg/dL   Bilirubin, Direct 0.22 0.00 - 0.40 mg/dL   Alkaline Phosphatase 79 48 - 121 IU/L   AST 18 0 - 40 IU/L   ALT 13 0 - 44 IU/L

## 2020-01-23 NOTE — Progress Notes (Signed)
Subjective:    Patient ID: David Pham, male    DOB: 05-31-1951, 69 y.o.   MRN: [id number]  HPI AWV- Annual Wellness Visit  The patient was seen for their annual wellness visit. The patient's past medical history, surgical history, and family history were reviewed. Pertinent vaccines were reviewed ( tetanus, pneumonia, shingles, flu) The patient's medication list was reviewed and updated.  The height and weight were entered.  BMI recorded in electronic record elsewhere  Cognitive screening was completed. Outcome of Mini - Cog: pass   Falls /depression screening electronically recorded within record elsewhere  Current tobacco usage: none (All patients who use tobacco were given written and verbal information on quitting)  Recent listing of emergency department/hospitalizations over the past year were reviewed.  current specialist the patient sees on a regular basis: none   Medicare annual wellness visit patient questionnaire was reviewed.  A written screening schedule for the patient for the next 5-10 years was given. Appropriate discussion of followup regarding next visit was discussed.  Fall Risk  01/23/2020 12/24/2019 11/13/2019 11/13/2019 10/31/2019  Falls in the past year? 0 0 0 0 0  Follow up Falls evaluation completed Falls evaluation completed Falls evaluation completed - Falls evaluation completed        Review of Systems  Constitutional: Negative for activity change, appetite change and fever.  HENT: Negative for congestion and rhinorrhea.   Eyes: Negative for discharge.  Respiratory: Negative for cough and wheezing.   Cardiovascular: Negative for chest pain.  Gastrointestinal: Negative for abdominal pain, blood in stool and vomiting.  Genitourinary: Negative for difficulty urinating and frequency.  Musculoskeletal: Negative for neck pain.  Skin: Negative for rash.  Allergic/Immunologic: Negative for environmental allergies and food allergies.  Neurological:  Negative for weakness and headaches.  Psychiatric/Behavioral: Negative for agitation.       Objective:   Physical Exam Constitutional:      Appearance: He is well-developed.  HENT:     Head: Normocephalic and atraumatic.     Right Ear: External ear normal.     Left Ear: External ear normal.     Nose: Nose normal.  Eyes:     Pupils: Pupils are equal, round, and reactive to light.  Neck:     Thyroid: No thyromegaly.  Cardiovascular:     Rate and Rhythm: Normal rate and regular rhythm.     Heart sounds: Normal heart sounds. No murmur heard.   Pulmonary:     Effort: Pulmonary effort is normal. No respiratory distress.     Breath sounds: Normal breath sounds. No wheezing.  Abdominal:     General: Bowel sounds are normal. There is no distension.     Palpations: Abdomen is soft. There is no mass.     Tenderness: There is no abdominal tenderness.  Genitourinary:    Penis: Normal.   Musculoskeletal:        General: Normal range of motion.     Cervical back: Normal range of motion and neck supple.  Lymphadenopathy:     Cervical: No cervical adenopathy.  Skin:    General: Skin is warm and dry.     Findings: No erythema.  Neurological:     Mental Status: He is alert.     Motor: No abnormal muscle tone.  Psychiatric:        Behavior: Behavior normal.        Judgment: Judgment normal.     Patient is followed by urology they do a rectal exam  and PSA every year his PSA undetectable      Assessment & Plan:  Adult wellness-complete.wellness physical was conducted today. Importance of diet and exercise were discussed in detail.  In addition to this a discussion regarding safety was also covered. We also reviewed over immunizations and gave recommendations regarding current immunization needed for age.  In addition to this additional areas were also touched on including: Preventative health exams needed:  Colonoscopy 2022  Patient was advised yearly wellness exam Intermittent  shoulder pain he was told if this persists to follow-up for specific visit or see orthopedics

## 2020-02-02 ENCOUNTER — Encounter: Payer: Self-pay | Admitting: Family Medicine

## 2020-02-04 ENCOUNTER — Other Ambulatory Visit: Payer: Self-pay

## 2020-02-04 ENCOUNTER — Ambulatory Visit (INDEPENDENT_AMBULATORY_CARE_PROVIDER_SITE_OTHER): Payer: Medicare Other | Admitting: Family Medicine

## 2020-02-04 VITALS — BP 118/76 | Temp 97.7°F | Ht 68.0 in | Wt 166.2 lb

## 2020-02-04 DIAGNOSIS — B351 Tinea unguium: Secondary | ICD-10-CM

## 2020-02-04 DIAGNOSIS — L918 Other hypertrophic disorders of the skin: Secondary | ICD-10-CM | POA: Diagnosis not present

## 2020-02-04 MED ORDER — MOMETASONE FUROATE 0.1 % EX CREA: 1.0000 "application " | TOPICAL_CREAM | Freq: Two times a day (BID) | CUTANEOUS | 0 refills | Status: DC

## 2020-02-04 MED ORDER — MUPIROCIN 2 % EX OINT: 1.0000 "application " | TOPICAL_OINTMENT | Freq: Two times a day (BID) | CUTANEOUS | 0 refills | Status: DC

## 2020-02-04 NOTE — Progress Notes (Signed)
   Subjective:    Patient ID: David Pham, male    DOB: [birth year], 69 y.o.   MRN: [id number]  HPI  Patient arrives for a follow up on right index finger. Patient states he is still having issues with peeling and the nail and also had a blister come up on the side. He relates the nails on his feet bother him he is concerned about nail fungus Relates skin tag under his right arm he would like to have clipped off  Review of Systems     Objective:   Physical Exam Has multiple nails on the feet +1 on the hand that has the appearance of a fungal nail the one on the hand is separating which could be due to something other than fungal nail He will send nail culture Also has eczema changes on the index finger and also has a blistered area with some erythema that seems to be healing but recommend Bactroban       Assessment & Plan:  Skin tag underneath the right arm request to have it clipped off.  Has minimal attachment Clipped off without difficulty Sterile technique with rubbing alcohol for the antiseptic No complications Band-Aid placed  Eczema on the finger recommend steroid cream twice daily over the course of the next 2 weeks as needed Blister on the finger recommend Bactroban ointment over the next 10 to 14 days If ongoing troubles follow-up Possible fungal nails-send for nail culture

## 2020-02-05 DIAGNOSIS — B351 Tinea unguium: Secondary | ICD-10-CM | POA: Diagnosis not present

## 2020-02-09 ENCOUNTER — Other Ambulatory Visit: Payer: Self-pay | Admitting: Family Medicine

## 2020-02-16 LAB — FUNGUS CULTURE W SMEAR

## 2020-02-16 LAB — SPECIMEN STATUS REPORT

## 2020-02-18 ENCOUNTER — Ambulatory Visit (INDEPENDENT_AMBULATORY_CARE_PROVIDER_SITE_OTHER): Payer: Medicare Other | Admitting: Orthopaedic Surgery

## 2020-02-18 ENCOUNTER — Ambulatory Visit: Payer: Self-pay

## 2020-02-18 ENCOUNTER — Other Ambulatory Visit: Payer: Self-pay

## 2020-02-18 ENCOUNTER — Encounter: Payer: Self-pay | Admitting: Orthopaedic Surgery

## 2020-02-18 VITALS — BP 138/92 | Ht 68.0 in | Wt 166.0 lb

## 2020-02-18 DIAGNOSIS — G8929 Other chronic pain: Secondary | ICD-10-CM | POA: Diagnosis not present

## 2020-02-18 DIAGNOSIS — M25512 Pain in left shoulder: Secondary | ICD-10-CM | POA: Diagnosis not present

## 2020-02-18 NOTE — Progress Notes (Signed)
Office Visit Note   Patient: David Pham           Date of Birth: 29-Jun-1951           MRN: [id number] Visit Date: 02/18/2020              Requested by: Kathyrn Drown, MD [address],  [place] [address] PCP: Kathyrn Drown, MD   Assessment & Plan: Visit Diagnoses:  1. Acute pain of left shoulder   2. Chronic left shoulder pain     Plan: Mr. Kujawa relates that he has had some chronic issues with both of his shoulders in the past but over the past several months has had more problems on the left.  He believes he has had cortisone injections in the shoulders in the past but and more recently by Dr. Wolfgang Phoenix on the left.  He thought that it may have "helped a short time".  He is having trouble with overhead motion and strength.  He said difficulty sleeping on the left shoulder.  He denies any history of injury or trauma.  Films are consistent with rotator cuff arthropathy with superior migration of the humeral head and narrowing of the space between the humeral head and the acromium.  Long discussion regarding all of the above with potential diagnosis and treatment options including surgery.  We will obtain an MRI scan and have him return shortly thereafter  Follow-Up Instructions: Return After MRI scan right shoulder.   Orders:  Orders Placed This Encounter  Procedures  . XR Shoulder Left   No orders of the defined types were placed in this encounter.     Procedures: No procedures performed   Clinical Data: No additional findings.   Subjective: Chief Complaint  Patient presents with  . Left Shoulder - Pain  Patient presents with complaint of left shoulder pain x 1-2 months. He denies any known injury. He has had painful range of motion that seems to have gotten a little better. He continues to feel a pulling into the left upper arm when he raises it.  He did undergo a cortisone injection by his PCP on 12/24/2019 with relief. He uses tylenol, salonpas,  and icy hot as needed. Patient is right hand dominant. He states that he does drive a lot, but does not know if this affects his shoulder.  HPI  Review of Systems   Objective: Vital Signs: Ht 5\' 8"  (1.727 m)   Wt 166 lb (75.3 kg)   BMI 25.24 kg/m   Physical Exam Constitutional:      Appearance: He is well-developed.  Eyes:     Pupils: Pupils are equal, round, and reactive to light.  Pulmonary:     Effort: Pulmonary effort is normal.  Skin:    General: Skin is warm and dry.  Neurological:     Mental Status: He is alert and oriented to person, place, and time.  Psychiatric:        Behavior: Behavior normal.     Ortho Exam awake alert and oriented x3.  Comfortable sitting.  Left shoulder nondominant.  Positive impingement on extreme of external rotation.  Markedly positive empty can testing.  Does have weakness with external rotation.  Skin intact.  Some atrophy generally in the left shoulder which may or may not be related to the nondominant's.  Good grip and release.  Speeds sign minimally positive  Specialty Comments:  No specialty comments available.  Imaging: XR Shoulder Left  Result Date: 02/18/2020 Films of the left shoulder were obtained in several projections.  There is superior migration of the humeral head in relationship to the glenoid with only about 4 mm between the humeral head and the acromion.  Degenerative changes at the [facility] joint.  Some narrowing of the glenohumeral joint space.  Films are consistent with rotator cuff arthropathy    PMFS History: Patient Active Problem List   Diagnosis Date Noted  . Pain in left shoulder 02/18/2020  . Seasonal and perennial allergic rhinitis 11/28/2019  . Insect sting allergy 11/28/2019  . Chronic throat clearing 09/29/2019  . Mass of right knee 02/21/2017  . HTN (hypertension) 11/06/2016  . Left shoulder tendinitis 11/06/2016  . Spondylolisthesis at L5-S1 level 09/23/2015  . History of prostatectomy 05/11/2014  .  BACK PAIN 09/10/2008  . ELEVATED PROSTATE SPECIFIC ANTIGEN 07/06/2008  . COLONIC POLYPS 06/01/2008  . TINNITUS, CHRONIC, BILATERAL 06/01/2008  . Allergic rhinitis 06/01/2008  . PNEUMONIA, LEFT 06/01/2008  . DEGENERATIVE JOINT DISEASE 06/01/2008  . ESOPHAGEAL REFLUX 11/29/2007  . DYSPHAGIA UNSPECIFIED 10/28/2007   Past Medical History:  Diagnosis Date  . Cancer ([facility] [place]) 09/2009   prostate  . Hemorrhoids   . Incontinence of feces   . Rectal pain   . White coat hypertension     Family History  Problem Relation Age of Onset  . Cancer Sister        breast  . Hypertension Brother   . Diabetes Brother   . Emphysema Mother   . Asthma Father   . Allergic rhinitis Father   . Angioedema Neg Hx   . Atopy Neg Hx   . Eczema Neg Hx   . Immunodeficiency Neg Hx   . Urticaria Neg Hx     Past Surgical History:  Procedure Laterality Date  . COLONOSCOPY  2006 and 2012  . COLONOSCOPY  08/04/2010  . PROSTATECTOMY     2011   Social History   Occupational History  . Not on file  Tobacco Use  . Smoking status: Never Smoker  . Smokeless tobacco: Current User    Types: Snuff  Vaping Use  . Vaping Use: Never used  Substance and Sexual Activity  . Alcohol use: Never  . Drug use: Never  . Sexual activity: Not on file

## 2020-02-18 NOTE — Addendum Note (Signed)
Addended by: Meyer Cory on: 02/18/2020 09:56 AM   Modules accepted: Orders

## 2020-03-11 ENCOUNTER — Ambulatory Visit (HOSPITAL_COMMUNITY): Payer: Medicare Other

## 2020-03-11 ENCOUNTER — Encounter (HOSPITAL_COMMUNITY): Payer: Self-pay

## 2020-03-17 ENCOUNTER — Ambulatory Visit: Payer: Medicare Other | Admitting: Orthopaedic Surgery

## 2020-03-21 ENCOUNTER — Other Ambulatory Visit: Payer: Self-pay | Admitting: Family Medicine

## 2020-03-28 ENCOUNTER — Encounter: Payer: Self-pay | Admitting: Family Medicine

## 2020-03-28 DIAGNOSIS — B351 Tinea unguium: Secondary | ICD-10-CM | POA: Insufficient documentation

## 2020-03-28 HISTORY — DX: Tinea unguium: B35.1

## 2020-04-02 ENCOUNTER — Ambulatory Visit (INDEPENDENT_AMBULATORY_CARE_PROVIDER_SITE_OTHER): Payer: Medicare Other | Admitting: Family Medicine

## 2020-04-02 ENCOUNTER — Encounter: Payer: Self-pay | Admitting: Family Medicine

## 2020-04-02 ENCOUNTER — Other Ambulatory Visit: Payer: Self-pay

## 2020-04-02 VITALS — BP 124/82 | Temp 97.5°F | Ht 68.0 in | Wt 167.8 lb

## 2020-04-02 DIAGNOSIS — R05 Cough: Secondary | ICD-10-CM | POA: Diagnosis not present

## 2020-04-02 DIAGNOSIS — B351 Tinea unguium: Secondary | ICD-10-CM

## 2020-04-02 DIAGNOSIS — R053 Chronic cough: Secondary | ICD-10-CM

## 2020-04-02 MED ORDER — AMLODIPINE BESYLATE 2.5 MG PO TABS: 2.5000 mg | ORAL_TABLET | Freq: Every day | ORAL | 1 refills | Status: DC

## 2020-04-02 MED ORDER — OMEPRAZOLE 20 MG PO CPDR
20.0000 mg | DELAYED_RELEASE_CAPSULE | Freq: Every day | ORAL | 1 refills | Status: DC
Start: 2020-04-02 — End: 2020-10-01

## 2020-04-02 NOTE — Progress Notes (Signed)
   Subjective:    Patient ID: David Pham, male    DOB: Nov 17, 1950, 69 y.o.   MRN: [id number]  HPI  Patient arrives for a follow up on nail issue and finger rash- picked up the medication this am but has not started it yet. Patient is concerned about rash that appears on his hands he is also concerned about nail becoming somewhat separated Denies any other major setbacks or problems also relates a lot of shoulder discomfort and pain but he tries to stretch it out specialist recommend an MRI but patient did not want to do this at this time Review of Systems  Constitutional: Negative for activity change, appetite change and fatigue.  HENT: Negative for congestion and rhinorrhea.   Respiratory: Negative for cough and shortness of breath.   Cardiovascular: Negative for chest pain and leg swelling.  Gastrointestinal: Negative for abdominal pain, nausea and vomiting.  Neurological: Negative for dizziness and headaches.  Psychiatric/Behavioral: Negative for agitation and behavioral problems.       Objective:   Physical Exam Vitals reviewed.  Cardiovascular:     Rate and Rhythm: Normal rate and regular rhythm.     Heart sounds: Normal heart sounds. No murmur heard.   Pulmonary:     Effort: Pulmonary effort is normal.     Breath sounds: Normal breath sounds.  Lymphadenopathy:     Cervical: No cervical adenopathy.  Neurological:     Mental Status: He is alert.  Psychiatric:        Behavior: Behavior normal.            Assessment & Plan:  Shoulder pain-gentle range of motion exercises show Fungus with the nail try topical.  If he is dissatisfied with the results we will probably get second opinion with dermatology [place] Frequent cough has had x-rays in the past felt to be a combination of reflux as well as postnasal drip continue medications no red flags no hemoptysis no weight loss or shortness of breath

## 2020-05-01 ENCOUNTER — Other Ambulatory Visit: Payer: Self-pay | Admitting: Family Medicine

## 2020-07-22 ENCOUNTER — Telehealth (INDEPENDENT_AMBULATORY_CARE_PROVIDER_SITE_OTHER): Payer: Medicare Other | Admitting: Family Medicine

## 2020-07-22 ENCOUNTER — Other Ambulatory Visit: Payer: Self-pay

## 2020-07-22 DIAGNOSIS — R1013 Epigastric pain: Secondary | ICD-10-CM

## 2020-07-22 MED ORDER — SUCRALFATE 1 G PO TABS: ORAL_TABLET | ORAL | 0 refills | Status: DC

## 2020-07-22 MED ORDER — PANTOPRAZOLE SODIUM 40 MG PO TBEC: 40.0000 mg | DELAYED_RELEASE_TABLET | Freq: Every day | ORAL | 3 refills | Status: DC

## 2020-07-22 NOTE — Progress Notes (Signed)
   Subjective:    Patient ID: David Pham, male    DOB: [birth year], 69 y.o.   MRN: [id number]  HPI  Patient calls with stomach issues that started last week. Started with some ausea and stomach rolling and gas last week- stomach still not acting completely right- still a lot of gas and rolling when eating. Intermittent abdominal discomforts and also feels like a rolling denies pain denies soreness in the abdomen it just a funny feeling in his stomach along with a rolling sensation denies high fever chills sweats denies rectal bleeding denies weight loss states appetite good  Virtual Visit via Telephone Note  I connected with David Pham on 07/22/20 at  1:40 PM EST by telephone and verified that I am speaking with the correct person using two identifiers.  Location: Patient: home Provider: office   I discussed the limitations, risks, security and privacy concerns of performing an evaluation and management service by telephone and the availability of in person appointments. I also discussed with the patient that there may be a patient responsible charge related to this service. The patient expressed understanding and agreed to proceed.   History of Present Illness:    Observations/Objective:   Assessment and Plan:   Follow Up Instructions:    I discussed the assessment and treatment plan with the patient. The patient was provided an opportunity to ask questions and all were answered. The patient agreed with the plan and demonstrated an understanding of the instructions.   The patient was advised to call back or seek an in-person evaluation if the symptoms worsen or if the condition fails to improve as anticipated.  I provided 20 minutes of non-face-to-face time during this encounter.     Review of Systems  Constitutional: Negative for activity change.  HENT: Negative for congestion and rhinorrhea.   Respiratory: Negative for cough and shortness of breath.    Cardiovascular: Negative for chest pain.  Gastrointestinal: Negative for abdominal pain, diarrhea, nausea and vomiting.  Genitourinary: Negative for dysuria and hematuria.  Neurological: Negative for weakness and headaches.  Psychiatric/Behavioral: Negative for behavioral problems and confusion.       Objective:   Physical Exam  Today's visit was via telephone Physical exam was not possible for this visit       Assessment & Plan:  Intermittent abdominal symptoms Probable dyspepsia Switch PPI Protonix Carafate Give [place] feedback in the next 2 to 3 weeks how things go and follow-up sooner if any problems Follow-up otherwise in 3 months If the patient is not doing any better over the next 10 days he is to let us know so he can follow-up to do an office visit in person

## 2020-08-02 ENCOUNTER — Other Ambulatory Visit: Payer: Self-pay

## 2020-08-02 ENCOUNTER — Ambulatory Visit (INDEPENDENT_AMBULATORY_CARE_PROVIDER_SITE_OTHER): Payer: Medicare Other | Admitting: Family Medicine

## 2020-08-02 ENCOUNTER — Encounter: Payer: Self-pay | Admitting: Family Medicine

## 2020-08-02 VITALS — BP 110/76 | HR 86 | Temp 97.8°F | Ht 68.0 in | Wt 166.0 lb

## 2020-08-02 DIAGNOSIS — R1013 Epigastric pain: Secondary | ICD-10-CM | POA: Diagnosis not present

## 2020-08-02 NOTE — Progress Notes (Signed)
° °  Subjective:    Patient ID: David Pham, male    DOB: [birth year], 70 y.o.   MRN: [id number]  HPIfeels bloated and sometimes get a pain in abdomen. Started 2 weeks ago. Was prescribed protonix  and carafate on 12/30. Pt states he switched back to omeprazole 20mg  bc protonix was not controlling reflux.   Some bloating feeling occas pain last a few seconds comes and goes Feels uneasy at times carafate helped a little bm normal No blood  protonix did not help He states omeprazole does better   Review of Systems  Constitutional: Negative for activity change.  HENT: Negative for congestion and rhinorrhea.   Respiratory: Negative for cough and shortness of breath.   Cardiovascular: Negative for chest pain.  Gastrointestinal: Negative for abdominal pain, diarrhea, nausea and vomiting.  Genitourinary: Negative for dysuria and hematuria.  Neurological: Negative for weakness and headaches.  Psychiatric/Behavioral: Negative for behavioral problems and confusion.       Objective:   Physical Exam Vitals reviewed.  Constitutional:      Appearance: He is well-nourished.  Cardiovascular:     Rate and Rhythm: Normal rate and regular rhythm.     Heart sounds: Normal heart sounds. No murmur heard.   Pulmonary:     Effort: Pulmonary effort is normal.     Breath sounds: Normal breath sounds.  Musculoskeletal:        General: No edema.  Lymphadenopathy:     Cervical: No cervical adenopathy.  Neurological:     Mental Status: He is alert.  Psychiatric:        Behavior: Behavior normal.   Abdomen is soft no guarding rebound or tenderness  Patient states he is holding his weight okay eating okay I did give him information on FODMAP diet to try      Assessment & Plan:  1. Dyspepsia Given his level of discomfort continue omeprazole May use Carafate as needed lab work ordered hold off on ultrasound currently - Comprehensive metabolic panel - CBC with Differential/Platelet -  Amylase - Lipase - H Pylori, IGM, IGG, IGA AB  2. Epigastric pain We will do a follow-up phone visit in 2 to 3 weeks if still having symptoms would lean toward doing ultrasound and if this is not unremarkable the next step gastroenterology for possible EGD - Comprehensive metabolic panel - CBC with Differential/Platelet - Amylase - Lipase - H Pylori, IGM, IGG, IGA AB

## 2020-08-04 LAB — COMPREHENSIVE METABOLIC PANEL
ALT: 12 IU/L (ref 0–44)
AST: 17 IU/L (ref 0–40)
Albumin/Globulin Ratio: 1.9 (ref 1.2–2.2)
Albumin: 4.3 g/dL (ref 3.8–4.8)
Alkaline Phosphatase: 80 IU/L (ref 44–121)
BUN/Creatinine Ratio: 9 — ABNORMAL LOW (ref 10–24)
BUN: 9 mg/dL (ref 8–27)
Bilirubin Total: 0.6 mg/dL (ref 0.0–1.2)
CO2: 24 mmol/L (ref 20–29)
Calcium: 9.2 mg/dL (ref 8.6–10.2)
Chloride: 105 mmol/L (ref 96–106)
Creatinine, Ser: 0.99 mg/dL (ref 0.76–1.27)
GFR calc Af Amer: 89 mL/min/{1.73_m2} (ref >59)
GFR calc non Af Amer: 77 mL/min/{1.73_m2} (ref >59)
Globulin, Total: 2.3 g/dL (ref 1.5–4.5)
Glucose: 101 mg/dL — ABNORMAL HIGH (ref 65–99)
Potassium: 3.7 mmol/L (ref 3.5–5.2)
Sodium: 142 mmol/L (ref 134–144)
Total Protein: 6.6 g/dL (ref 6.0–8.5)

## 2020-08-04 LAB — CBC WITH DIFFERENTIAL/PLATELET
Basophils Absolute: 0.1 10*3/uL (ref 0.0–0.2)
Basos: 0 %
EOS (ABSOLUTE): 0.1 10*3/uL (ref 0.0–0.4)
Eos: 0 %
Hematocrit: 42.3 % (ref 37.5–51.0)
Hemoglobin: 14.6 g/dL (ref 13.0–17.7)
Immature Grans (Abs): 0 10*3/uL (ref 0.0–0.1)
Immature Granulocytes: 0 %
Lymphocytes Absolute: 12.1 10*3/uL — ABNORMAL HIGH (ref 0.7–3.1)
Lymphs: 72 %
MCH: 30.6 pg (ref 26.6–33.0)
MCHC: 34.5 g/dL (ref 31.5–35.7)
MCV: 89 fL (ref 79–97)
Monocytes Absolute: 0.5 10*3/uL (ref 0.1–0.9)
Monocytes: 3 %
Neutrophils Absolute: 4.3 10*3/uL (ref 1.4–7.0)
Neutrophils: 25 %
Platelets: 246 10*3/uL (ref 150–450)
RBC: 4.77 x10E6/uL (ref 4.14–5.80)
RDW: 12.7 % (ref 11.6–15.4)
WBC: 17 10*3/uL — ABNORMAL HIGH (ref 3.4–10.8)

## 2020-08-04 LAB — AMYLASE: Amylase: 50 U/L (ref 31–110)

## 2020-08-04 LAB — H PYLORI, IGM, IGG, IGA AB
H pylori, IgM Abs: <9 units (ref 0.0–8.9)
H. pylori, IgA Abs: <9 units (ref 0.0–8.9)
H. pylori, IgG AbS: 0.15 Index Value (ref 0.00–0.79)

## 2020-08-04 LAB — LIPASE: Lipase: 41 U/L (ref 13–78)

## 2020-08-06 ENCOUNTER — Telehealth: Payer: Self-pay

## 2020-08-06 ENCOUNTER — Other Ambulatory Visit: Payer: Self-pay | Admitting: Family Medicine

## 2020-08-06 DIAGNOSIS — R1013 Epigastric pain: Secondary | ICD-10-CM

## 2020-08-06 NOTE — Telephone Encounter (Signed)
Pt did blood work on stomach come back and nurse went over blood work white blood count elevated and might have infection nurse was going to check with Dr Nicki Reaper about meds call in and Pt was calling back to check on this.   Pt call back [phone]

## 2020-08-06 NOTE — Telephone Encounter (Signed)
Contacted patient and reviewed over Dr.Scott recommendation on lab work. Pt verbalized understanding.

## 2020-08-13 ENCOUNTER — Other Ambulatory Visit: Payer: Self-pay | Admitting: Family Medicine

## 2020-08-13 NOTE — Telephone Encounter (Signed)
Seen 08/03/19 for bloating

## 2020-08-20 ENCOUNTER — Telehealth: Payer: Self-pay | Admitting: *Deleted

## 2020-08-20 ENCOUNTER — Other Ambulatory Visit: Payer: Self-pay

## 2020-08-20 ENCOUNTER — Telehealth (INDEPENDENT_AMBULATORY_CARE_PROVIDER_SITE_OTHER): Payer: Medicare Other | Admitting: Family Medicine

## 2020-08-20 DIAGNOSIS — R1013 Epigastric pain: Secondary | ICD-10-CM | POA: Diagnosis not present

## 2020-08-20 NOTE — Telephone Encounter (Signed)
[name], pring are scheduled for a virtual visit with your provider today.    Just as we do with appointments in the office, we must obtain your consent to participate.  Your consent will be active for this visit and any virtual visit you may have with one of our providers in the next 365 days.    If you have a MyChart account, I can also send a copy of this consent to you electronically.  All virtual visits are billed to your insurance company just like a traditional visit in the office.  As this is a virtual visit, video technology does not allow for your provider to perform a traditional examination.  This may limit your provider's ability to fully assess your condition.  If your provider identifies any concerns that need to be evaluated in person or the need to arrange testing such as labs, EKG, etc, we will make arrangements to do so.    Although advances in technology are sophisticated, we cannot ensure that it will always work on either your end or our end.  If the connection with a video visit is poor, we may have to switch to a telephone visit.  With either a video or telephone visit, we are not always able to ensure that we have a secure connection.   I need to obtain your verbal consent now.   Are you willing to proceed with your visit today?   David Pham has provided verbal consent on 08/20/2020 for a virtual visit (video or telephone).

## 2020-08-20 NOTE — Progress Notes (Signed)
   Subjective:    Patient ID: David Pham, male    DOB: [birth year], 70 y.o.   MRN: [id number]  HPI  Patient calls for a follow up on dyspepsia and bloating. Patient states he has been doing a lot better this week- not bothering this week.  Patient relates overall he is feeling better he is not having much in the way of belching denies any regurgitation denies vomiting denies rectal bleeding denies abdominal pain states his weight staying stable.  Energy level doing okay.  Review of Systems Virtual Visit via Telephone Note  I connected with David Pham on 08/20/20 at 11:30 AM EST by telephone and verified that I am speaking with the correct person using two identifiers.  Location: Patient: Home Provider: Office   I discussed the limitations, risks, security and privacy concerns of performing an evaluation and management service by telephone and the availability of in person appointments. I also discussed with the patient that there may be a patient responsible charge related to this service. The patient expressed understanding and agreed to proceed.   History of Present Illness:    Observations/Objective:   Assessment and Plan:   Follow Up Instructions:    I discussed the assessment and treatment plan with the patient. The patient was provided an opportunity to ask questions and all were answered. The patient agreed with the plan and demonstrated an understanding of the instructions.   The patient was advised to call back or seek an in-person evaluation if the symptoms worsen or if the condition fails to improve as anticipated.  I provided 15 minutes of non-face-to-face time during this encounter.   Mitzie Na, RN      Objective:   Physical Exam  Today's visit was via telephone Physical exam was not possible for this visit Patient did not have the ability to do video.      Assessment & Plan:  Dyspepsia Abdominal pain Doing much better now Continue  medication Recommend to taper off of the Carafate If he is not doing very well over the next 3 to 4 weeks to notify us otherwise follow-up later in the spring

## 2020-08-25 ENCOUNTER — Telehealth: Payer: Self-pay

## 2020-08-25 DIAGNOSIS — R1013 Epigastric pain: Secondary | ICD-10-CM

## 2020-08-25 NOTE — Telephone Encounter (Signed)
Please refer to [facility] for epigastric discomfort

## 2020-08-25 NOTE — Telephone Encounter (Signed)
Seen 1/22 for abd pain

## 2020-08-25 NOTE — Telephone Encounter (Signed)
Referral placed and pt is aware. 

## 2020-08-25 NOTE — Telephone Encounter (Signed)
Pt is calling wanting a referral for GI on his stomach said that Dr Nicki Reaper has already went over this about sending a referral   Pt call back [phone]

## 2020-08-26 DIAGNOSIS — N201 Calculus of ureter: Secondary | ICD-10-CM | POA: Diagnosis not present

## 2020-08-26 DIAGNOSIS — Z87442 Personal history of urinary calculi: Secondary | ICD-10-CM | POA: Diagnosis not present

## 2020-08-26 DIAGNOSIS — N200 Calculus of kidney: Secondary | ICD-10-CM | POA: Diagnosis not present

## 2020-08-30 ENCOUNTER — Encounter (INDEPENDENT_AMBULATORY_CARE_PROVIDER_SITE_OTHER): Payer: Self-pay | Admitting: *Deleted

## 2020-09-07 ENCOUNTER — Ambulatory Visit (HOSPITAL_COMMUNITY)
Admission: RE | Admit: 2020-09-07 | Discharge: 2020-09-07 | Disposition: A | Discharge status: Home / Self Care | Payer: Medicare Other | Source: Ambulatory Visit | Attending: Family Medicine | Admitting: Family Medicine

## 2020-09-07 ENCOUNTER — Other Ambulatory Visit: Payer: Self-pay

## 2020-09-07 DIAGNOSIS — R1013 Epigastric pain: Secondary | ICD-10-CM | POA: Insufficient documentation

## 2020-09-07 DIAGNOSIS — K7460 Unspecified cirrhosis of liver: Secondary | ICD-10-CM | POA: Diagnosis not present

## 2020-09-07 DIAGNOSIS — N281 Cyst of kidney, acquired: Secondary | ICD-10-CM | POA: Diagnosis not present

## 2020-10-01 ENCOUNTER — Other Ambulatory Visit: Payer: Self-pay | Admitting: Family Medicine

## 2020-10-01 ENCOUNTER — Other Ambulatory Visit: Payer: Self-pay

## 2020-10-01 ENCOUNTER — Ambulatory Visit (INDEPENDENT_AMBULATORY_CARE_PROVIDER_SITE_OTHER): Payer: Medicare Other | Admitting: Family Medicine

## 2020-10-01 VITALS — BP 126/82 | Temp 97.3°F | Ht 68.0 in | Wt 164.8 lb

## 2020-10-01 DIAGNOSIS — K760 Fatty (change of) liver, not elsewhere classified: Secondary | ICD-10-CM

## 2020-10-01 DIAGNOSIS — D72829 Elevated white blood cell count, unspecified: Secondary | ICD-10-CM

## 2020-10-01 DIAGNOSIS — R1013 Epigastric pain: Secondary | ICD-10-CM | POA: Diagnosis not present

## 2020-10-01 MED ORDER — AMLODIPINE BESYLATE 2.5 MG PO TABS: 2.5000 mg | ORAL_TABLET | Freq: Every day | ORAL | 1 refills | Status: DC

## 2020-10-01 MED ORDER — OMEPRAZOLE 20 MG PO CPDR: 20.0000 mg | DELAYED_RELEASE_CAPSULE | Freq: Every day | ORAL | 1 refills | Status: DC

## 2020-10-01 NOTE — Progress Notes (Signed)
   Subjective:    Patient ID: David Pham, male    DOB: 1951/01/28, 70 y.o.   MRN: [id number]  HPI Patient arrives for a follow up on abdominal pain. Patient states he is doing better but still feels bloated occassionally. Discuss results of recent tests   David Pham is a very nice gentleman.  He has been having problems with intermittent abdominal discomfort regurgitation issues.  Denies high fever chills or sweats.  No bloody stools.  Energy level overall doing halfway decent  He relates a lot of gas but denies any heartburn or regurgitation reflux he does state he gets bloated rather frequently Review of Systems  Constitutional: Negative for activity change, fatigue and fever.  HENT: Negative for congestion and rhinorrhea.   Respiratory: Negative for cough and shortness of breath.   Cardiovascular: Negative for chest pain and leg swelling.  Gastrointestinal: Negative for abdominal pain, diarrhea and nausea.  Genitourinary: Negative for dysuria and hematuria.  Neurological: Negative for weakness and headaches.  Psychiatric/Behavioral: Negative for agitation and behavioral problems.   feels bloated     Objective:   Physical Exam Vitals reviewed.  Cardiovascular:     Rate and Rhythm: Normal rate and regular rhythm.     Heart sounds: Normal heart sounds. No murmur heard.   Pulmonary:     Effort: Pulmonary effort is normal.     Breath sounds: Normal breath sounds.  Lymphadenopathy:     Cervical: No cervical adenopathy.  Neurological:     Mental Status: He is alert.  Psychiatric:        Behavior: Behavior normal.    Abdomen soft no guarding rebound or tenderness       Assessment & Plan:  1. Fatty liver There is fatty liver on the ultrasound it is not worrisome but nonetheless is there healthy eating on a regular physical activity is recommended.  2. Leukocytosis, unspecified type CBC did show elevated WBC on last blood draw we will repeat this to make sure that is  not dramatically changing, ideally we would like to see it back to normal to help exclude out possibility of developing CLL  Colon this spring-colonoscopy this spring with gastroenterology   Ultrasound also shows a small hepatic cyst this is inconsequential also shows some potential renal thickening but no sign of any tumor lab work had looked good no need for further work-up currently Wellness exam in July

## 2020-10-02 LAB — CBC WITH DIFFERENTIAL/PLATELET
Basophils Absolute: 0.1 10*3/uL (ref 0.0–0.2)
Basos: 0 %
EOS (ABSOLUTE): 0.1 10*3/uL (ref 0.0–0.4)
Eos: 0 %
Hematocrit: 42.8 % (ref 37.5–51.0)
Hemoglobin: 14.6 g/dL (ref 13.0–17.7)
Immature Grans (Abs): 0 10*3/uL (ref 0.0–0.1)
Immature Granulocytes: 0 %
Lymphocytes Absolute: 12.1 10*3/uL — ABNORMAL HIGH (ref 0.7–3.1)
Lymphs: 74 %
MCH: 30.1 pg (ref 26.6–33.0)
MCHC: 34.1 g/dL (ref 31.5–35.7)
MCV: 88 fL (ref 79–97)
Monocytes Absolute: 0.7 10*3/uL (ref 0.1–0.9)
Monocytes: 4 %
Neutrophils Absolute: 3.5 10*3/uL (ref 1.4–7.0)
Neutrophils: 22 %
Platelets: 217 10*3/uL (ref 150–450)
RBC: 4.85 x10E6/uL (ref 4.14–5.80)
RDW: 12.5 % (ref 11.6–15.4)
WBC: 16.5 10*3/uL — ABNORMAL HIGH (ref 3.4–10.8)

## 2020-10-04 ENCOUNTER — Other Ambulatory Visit: Payer: Self-pay | Admitting: *Deleted

## 2020-10-04 DIAGNOSIS — D72829 Elevated white blood cell count, unspecified: Secondary | ICD-10-CM

## 2020-10-08 ENCOUNTER — Encounter (HOSPITAL_COMMUNITY): Payer: Self-pay

## 2020-10-08 ENCOUNTER — Encounter (HOSPITAL_COMMUNITY): Payer: Self-pay | Admitting: Hematology

## 2020-10-10 DIAGNOSIS — D72829 Elevated white blood cell count, unspecified: Secondary | ICD-10-CM | POA: Insufficient documentation

## 2020-10-11 ENCOUNTER — Other Ambulatory Visit: Payer: Self-pay

## 2020-10-11 ENCOUNTER — Inpatient Hospital Stay (HOSPITAL_COMMUNITY): Payer: Medicare Other | Attending: Hematology | Admitting: Hematology

## 2020-10-11 ENCOUNTER — Inpatient Hospital Stay (HOSPITAL_COMMUNITY): Payer: Medicare Other

## 2020-10-11 VITALS — BP 136/86 | HR 69 | Temp 97.2°F | Resp 18 | Ht 69.0 in | Wt 162.1 lb

## 2020-10-11 DIAGNOSIS — R5383 Other fatigue: Secondary | ICD-10-CM | POA: Insufficient documentation

## 2020-10-11 DIAGNOSIS — Z8546 Personal history of malignant neoplasm of prostate: Secondary | ICD-10-CM | POA: Diagnosis not present

## 2020-10-11 DIAGNOSIS — D72820 Lymphocytosis (symptomatic): Secondary | ICD-10-CM | POA: Insufficient documentation

## 2020-10-11 DIAGNOSIS — Z79899 Other long term (current) drug therapy: Secondary | ICD-10-CM | POA: Diagnosis not present

## 2020-10-11 LAB — CBC WITH DIFFERENTIAL/PLATELET
Abs Immature Granulocytes: 0.02 10*3/uL (ref 0.00–0.07)
Basophils Absolute: 0 10*3/uL (ref 0.0–0.1)
Basophils Relative: 0 %
Eosinophils Absolute: 0 10*3/uL (ref 0.0–0.5)
Eosinophils Relative: 0 %
HCT: 44.2 % (ref 39.0–52.0)
Hemoglobin: 14.4 g/dL (ref 13.0–17.0)
Immature Granulocytes: 0 %
Lymphocytes Relative: 76 %
Lymphs Abs: 12.7 10*3/uL — ABNORMAL HIGH (ref 0.7–4.0)
MCH: 30.1 pg (ref 26.0–34.0)
MCHC: 32.6 g/dL (ref 30.0–36.0)
MCV: 92.5 fL (ref 80.0–100.0)
Monocytes Absolute: 0.5 10*3/uL (ref 0.1–1.0)
Monocytes Relative: 3 %
Neutro Abs: 3.5 10*3/uL (ref 1.7–7.7)
Neutrophils Relative %: 21 %
Platelets: 221 10*3/uL (ref 150–400)
RBC: 4.78 MIL/uL (ref 4.22–5.81)
RDW: 12.7 % (ref 11.5–15.5)
WBC: 16.7 10*3/uL — ABNORMAL HIGH (ref 4.0–10.5)
nRBC: 0 % (ref 0.0–0.2)

## 2020-10-11 LAB — SAVE SMEAR(SSMR), FOR PROVIDER SLIDE REVIEW

## 2020-10-11 LAB — LACTATE DEHYDROGENASE: LDH: 133 U/L (ref 98–192)

## 2020-10-11 NOTE — Progress Notes (Signed)
[place] [address], [place] [address]   CLINIC:  Medical Oncology/Hematology  CONSULT NOTE  Patient Care Team: Kathyrn Drown, MD as PCP - General (Family Medicine)  CHIEF COMPLAINTS/PURPOSE OF CONSULTATION:  Lymphocytosis  HISTORY OF PRESENTING ILLNESS:  David Pham 70 y.o. male is here because of elevated lymphocyte count noted by PCP.  He was referred here by his primary care doctor, Dr. Sallee Lange MD.    Leukocytosis first noted 11/17/2017 with total WBC 11.9, most recently total WBC 16.5 on 10/01/2020, with 74% lymphocytes.  Absolute lymphocytosis first noted 11/17/2017 with lymphocytes 5.9, most recent lymphocytes 12.1 as of 10/01/2020.  CBC on 10/01/2020 showed normal hemoglobin (14.6) and normal platelets (217).  Patient is relatively asymptomatic.  He admits to slightly increased mild fatigue, and occasional night sweats.  No unintentional weight loss, fever, chills.  No lymphadenopathy.  (Does report intermittent "lumps" on the skin of his axillary region, which are red, tender, and self/resolve in 2 to 3 days).  He denies frequent infections.  No symptoms of hyperviscosity.  Patient does complain of having "stomach problems" for the past 3 months.  He reports constipation, intermittent gas pain, occasional nausea, bloating, and mild early satiety.  He has an appointment with GI later this month.  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  He is currently retired.  He denies excessive pesticide exposure.  He reports a former history of excessive alcohol use (beer), but reports that he no longer drinks alcohol.  He does continue to use daily chewing tobacco/snuff, but is a non-smoker.  Patient has a history of prostate cancer, with total prostatectomy in 2011, never received chemotherapy or radiation, last PSA was undetectable.  Patient has a sister who had melanoma.  No other  family history of cancer.  He does note that his sister also has an elevated white blood cell count, but that this has never been investigated.  MEDICAL HISTORY:  Past Medical History:  Diagnosis Date  . Cancer ([place]) 09/2009   prostate  . Hemorrhoids   . Incontinence of feces   . Rectal pain   . Toenail fungus 03/28/2020   I would not recommend oral medication due to liver toxicity potential, treating topically  . White coat hypertension     SURGICAL HISTORY: Past Surgical History:  Procedure Laterality Date  . COLONOSCOPY  2006 and 2012  . COLONOSCOPY  08/04/2010  . PROSTATECTOMY     2011    SOCIAL HISTORY: Social History   Socioeconomic History  . Marital status: Single    Spouse name: Not on file  . Number of children: Not on file  . Years of education: Not on file  . Highest education level: Not on file  Occupational History  . Not on file  Tobacco Use  . Smoking status: Never Smoker  . Smokeless tobacco: Current User    Types: Snuff  Vaping Use  . Vaping Use: Never used  Substance and Sexual Activity  . Alcohol use: Never  . Drug use: Never  . Sexual activity: Not on file  Other Topics Concern  . Not on file  Social History Narrative  . Not on file   Social Determinants of Health   Financial Resource Strain: Not on file  Food Insecurity: Not on file  Transportation Needs: No Transportation Needs  . Lack of Transportation (Medical): No  . Lack of  Transportation (Non-Medical): No  Physical Activity: Inactive  . Days of Exercise per Week: 0 days  . Minutes of Exercise per Session: 0 min  Stress: Not on file  Social Connections: Not on file  Intimate Partner Violence: Not At Risk  . Fear of Current or Ex-Partner: No  . Emotionally Abused: No  . Physically Abused: No  . Sexually Abused: No    FAMILY HISTORY: Family History  Problem Relation Age of Onset  . Cancer Sister        breast  . Hypertension Brother   . Diabetes Brother   . Emphysema  Mother   . Asthma Father   . Allergic rhinitis Father   . Angioedema Neg Hx   . Atopy Neg Hx   . Eczema Neg Hx   . Immunodeficiency Neg Hx   . Urticaria Neg Hx     ALLERGIES:  is allergic to codeine, morphine and related, and penicillins.  MEDICATIONS:  Current Outpatient Medications  Medication Sig Dispense Refill  . acetaminophen (TYLENOL) 500 MG tablet Take 1,000 mg by mouth at bedtime as needed.    Marland Kitchen amLODipine (NORVASC) 2.5 MG tablet Take 1 tablet (2.5 mg total) by mouth daily. 90 tablet 1  . azelastine (OPTIVAR) 0.05 % ophthalmic solution Apply 1 drop to eye 2 (two) times daily.    . cholecalciferol (VITAMIN D) 1000 UNITS tablet Take 1,000 Units by mouth daily.    . Cyanocobalamin (VITAMIN B 12) 100 MCG LOZG Take 1,000 mg by mouth.    . fluticasone (FLONASE) 50 MCG/ACT nasal spray SPRAY 2 SPRAYS INTO EACH NOSTRIL EVERY DAY 48 mL 1  . FLUZONE HIGH-DOSE QUADRIVALENT 0.7 ML SUSY     . loratadine (CLARITIN) 10 MG tablet Take 10 mg by mouth daily. (Patient not taking: Reported on 10/08/2020)    . mometasone (ELOCON) 0.1 % cream Apply 1 application topically 2 (two) times daily. To finger rash 45 g 0  . mupirocin ointment (BACTROBAN) 2 % Apply 1 application topically 2 (two) times daily. For 10 days to finger blister 22 g 0  . omeprazole (PRILOSEC) 20 MG capsule Take 1 capsule (20 mg total) by mouth daily. 90 capsule 1  . Polyvinyl Alcohol-Povidone (REFRESH OP) Apply to eye.    . sucralfate (CARAFATE) 1 g tablet Take by mouth.    . triamcinolone cream (KENALOG) 0.1 % Apply bid prn to finger 30 g 1   No current facility-administered medications for this visit.    REVIEW OF SYSTEMS:   Review of Systems  Constitutional: Positive for fatigue. Negative for appetite change, chills, diaphoresis, fever and unexpected weight change.  HENT:   Negative for lump/mass and nosebleeds.   Eyes: Negative for eye problems.  Respiratory: Negative for cough, hemoptysis and shortness of breath.    Cardiovascular: Negative for chest pain, leg swelling and palpitations.  Gastrointestinal: Positive for abdominal pain, constipation and nausea. Negative for blood in stool, diarrhea and vomiting.  Genitourinary: Negative for hematuria.   Skin: Negative.  Negative for rash.  Neurological: Negative for dizziness, headaches and light-headedness.  Hematological: Does not bruise/bleed easily.      PHYSICAL EXAMINATION: ECOG PERFORMANCE STATUS: 1 - Symptomatic but completely ambulatory  Vitals:   10/11/20 0907  BP: 136/86  Pulse: 69  Resp: 18  Temp: (!) 97.2 F (36.2 C)  SpO2: 99%   Filed Weights   10/11/20 0907  Weight: 162 lb 1.6 oz (73.5 kg)    Physical Exam Constitutional:  Appearance: Normal appearance.  HENT:     Head: Normocephalic and atraumatic.     Mouth/Throat:     Mouth: Mucous membranes are moist.  Eyes:     Extraocular Movements: Extraocular movements intact.     Pupils: Pupils are equal, round, and reactive to light.  Cardiovascular:     Rate and Rhythm: Normal rate and regular rhythm.     Pulses: Normal pulses.     Heart sounds: Normal heart sounds.  Pulmonary:     Effort: Pulmonary effort is normal.     Breath sounds: Normal breath sounds.  Chest:  Breasts:     Right: No axillary adenopathy or supraclavicular adenopathy.     Left: No axillary adenopathy or supraclavicular adenopathy.    Abdominal:     General: Bowel sounds are normal.     Palpations: Abdomen is soft.     Tenderness: There is no abdominal tenderness.  Musculoskeletal:        General: No swelling.     Right lower leg: No edema.     Left lower leg: No edema.  Lymphadenopathy:     Head:     Right side of head: No submental, submandibular, preauricular, posterior auricular or occipital adenopathy.     Left side of head: No submental, submandibular, preauricular, posterior auricular or occipital adenopathy.     Cervical: No cervical adenopathy.     Right cervical: No  superficial, deep or posterior cervical adenopathy.    Left cervical: No superficial, deep or posterior cervical adenopathy.     Upper Body:     Right upper body: No supraclavicular or axillary adenopathy.     Left upper body: No supraclavicular or axillary adenopathy.  Skin:    General: Skin is warm and dry.  Neurological:     General: No focal deficit present.     Mental Status: He is alert and oriented to person, place, and time.  Psychiatric:        Mood and Affect: Mood is anxious.        Behavior: Behavior normal.       LABORATORY DATA:  I have reviewed the data as listed Recent Results (from the past 2160 hour(s))  Comprehensive metabolic panel     Status: Abnormal   Collection Time: 08/02/20  1:47 PM  Result Value Ref Range   Glucose 101 (H) 65 - 99 mg/dL   BUN 9 8 - 27 mg/dL   Creatinine, Ser 0.99 0.76 - 1.27 mg/dL   GFR calc non Af Amer 77 >59 mL/min/1.73   GFR calc Af Amer 89 >59 mL/min/1.73    Comment: **In accordance with recommendations from the NKF-ASN Task force,**   [facility] is in the process of updating its eGFR calculation to the   2021 CKD-EPI creatinine equation that estimates kidney function   without a race variable.    BUN/Creatinine Ratio 9 (L) 10 - 24   Sodium 142 134 - 144 mmol/L   Potassium 3.7 3.5 - 5.2 mmol/L   Chloride 105 96 - 106 mmol/L   CO2 24 20 - 29 mmol/L   Calcium 9.2 8.6 - 10.2 mg/dL   Total Protein 6.6 6.0 - 8.5 g/dL   Albumin 4.3 3.8 - 4.8 g/dL   Globulin, Total 2.3 1.5 - 4.5 g/dL   Albumin/Globulin Ratio 1.9 1.2 - 2.2   Bilirubin Total 0.6 0.0 - 1.2 mg/dL   Alkaline Phosphatase 80 44 - 121 IU/L    Comment:               **  Please note reference interval change**   AST 17 0 - 40 IU/L   ALT 12 0 - 44 IU/L  CBC with Differential/Platelet     Status: Abnormal   Collection Time: 08/02/20  1:47 PM  Result Value Ref Range   WBC 17.0 (H) 3.4 - 10.8 x10E3/uL   RBC 4.77 4.14 - 5.80 x10E6/uL   Hemoglobin 14.6 13.0 - 17.7 g/dL    Hematocrit 42.3 37.5 - 51.0 %   MCV 89 79 - 97 fL   MCH 30.6 26.6 - 33.0 pg   MCHC 34.5 31.5 - 35.7 g/dL   RDW 12.7 11.6 - 15.4 %   Platelets 246 150 - 450 x10E3/uL   Neutrophils 25 Not Estab. %   Lymphs 72 Not Estab. %    Comment: Smudge cells present Atypical lymphocytes.    Monocytes 3 Not Estab. %   Eos 0 Not Estab. %   Basos 0 Not Estab. %   Neutrophils Absolute 4.3 1.4 - 7.0 x10E3/uL   Lymphocytes Absolute 12.1 (H) 0.7 - 3.1 x10E3/uL   Monocytes Absolute 0.5 0.1 - 0.9 x10E3/uL   EOS (ABSOLUTE) 0.1 0.0 - 0.4 x10E3/uL   Basophils Absolute 0.1 0.0 - 0.2 x10E3/uL   Immature Granulocytes 0 Not Estab. %   Immature Grans (Abs) 0.0 0.0 - 0.1 x10E3/uL   Hematology Comments: Note:     Comment: Verified by microscopic examination.  Amylase     Status: None   Collection Time: 08/02/20  1:47 PM  Result Value Ref Range   Amylase 50 31 - 110 U/L  Lipase     Status: None   Collection Time: 08/02/20  1:47 PM  Result Value Ref Range   Lipase 41 13 - 78 U/L  H Pylori, IGM, IGG, IGA AB     Status: None   Collection Time: 08/02/20  1:47 PM  Result Value Ref Range   H. pylori, IgG AbS 0.15 0.00 - 0.79 Index Value    Comment:                              Negative           <0.80                              Equivocal    0.80 - 0.89                              Positive           >0.89    H. pylori, IgA Abs <9.0 0.0 - 8.9 units    Comment:                                 Negative          <9.0                                 Equivocal   9.0 - 11.0                                 Positive         >11.0  H pylori, IgM Abs <9.0 0.0 - 8.9 units    Comment:                                 Negative          <9.0                                 Equivocal   9.0 - 11.0                                 Positive         >11.0 This test was developed and its performance characteristics determined by [facility]. It has not been cleared or approved by the Food and Drug Administration.   CBC with  Differential     Status: Abnormal   Collection Time: 10/01/20  9:06 AM  Result Value Ref Range   WBC 16.5 (H) 3.4 - 10.8 x10E3/uL   RBC 4.85 4.14 - 5.80 x10E6/uL   Hemoglobin 14.6 13.0 - 17.7 g/dL   Hematocrit 42.8 37.5 - 51.0 %   MCV 88 79 - 97 fL   MCH 30.1 26.6 - 33.0 pg   MCHC 34.1 31.5 - 35.7 g/dL   RDW 12.5 11.6 - 15.4 %   Platelets 217 150 - 450 x10E3/uL   Neutrophils 22 Not Estab. %   Lymphs 74 Not Estab. %   Monocytes 4 Not Estab. %   Eos 0 Not Estab. %   Basos 0 Not Estab. %   Neutrophils Absolute 3.5 1.4 - 7.0 x10E3/uL   Lymphocytes Absolute 12.1 (H) 0.7 - 3.1 x10E3/uL   Monocytes Absolute 0.7 0.1 - 0.9 x10E3/uL   EOS (ABSOLUTE) 0.1 0.0 - 0.4 x10E3/uL   Basophils Absolute 0.1 0.0 - 0.2 x10E3/uL   Immature Granulocytes 0 Not Estab. %   Immature Grans (Abs) 0.0 0.0 - 0.1 x10E3/uL  CBC with Differential     Status: Abnormal   Collection Time: 10/11/20 10:40 AM  Result Value Ref Range   WBC 16.7 (H) 4.0 - 10.5 K/uL   RBC 4.78 4.22 - 5.81 MIL/uL   Hemoglobin 14.4 13.0 - 17.0 g/dL   HCT 44.2 39.0 - 52.0 %   MCV 92.5 80.0 - 100.0 fL   MCH 30.1 26.0 - 34.0 pg   MCHC 32.6 30.0 - 36.0 g/dL   RDW 12.7 11.5 - 15.5 %   Platelets 221 150 - 400 K/uL   nRBC 0.0 0.0 - 0.2 %   Neutrophils Relative % 21 %   Neutro Abs 3.5 1.7 - 7.7 K/uL   Lymphocytes Relative 76 %   Lymphs Abs 12.7 (H) 0.7 - 4.0 K/uL   Monocytes Relative 3 %   Monocytes Absolute 0.5 0.1 - 1.0 K/uL   Eosinophils Relative 0 %   Eosinophils Absolute 0.0 0.0 - 0.5 K/uL   Basophils Relative 0 %   Basophils Absolute 0.0 0.0 - 0.1 K/uL   Immature Granulocytes 0 %   Abs Immature Granulocytes 0.02 0.00 - 0.07 K/uL   Abnormal Lymphocytes Present PRESENT    Smudge Cells PRESENT     Comment: Performed at [facility], [address], [place], [place] [address]  Save Smear [facility])     Status: None   Collection Time: 10/11/20 10:40 AM  Result  Value Ref Range   Smear Review SMEAR STAINED AND AVAILABLE FOR REVIEW      Comment: Performed at [facility], [address], [place], [place] [address]  Lactate dehydrogenase     Status: None   Collection Time: 10/11/20 10:40 AM  Result Value Ref Range   LDH 133 98 - 192 U/L    Comment: Performed at [facility], [address] [place], [place] [address]    RADIOGRAPHIC STUDIES: I have personally reviewed the radiological images as listed and agreed with the findings in the report. No results found.  ASSESSMENT: 1. Lymphocytosis -Lymphocyte predominant leukocytosis since 2019 -Leukocytosis first noted 11/17/2017 with total WBC 11.9 and absolute lymphocytes 5.9 -Most recent labs reviewed from 10/01/2020, WBC 16.5 with 74% lymphocytes, absolute lymphocytes 12.1. -No anemia or thrombocytopenia to date -No palpable lymphadenopathy, splenomegaly, hepatomegaly on exam -Abdominal ultrasound obtained 09/07/2020 was negative for splenomegaly  2.  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  Lymphocytosis -Suspect that patient likely has chronic lymphocytic leukemia/other B-cell lymphoproliferative disorder. -Work-up for CLL including peripheral blood smear, CBC with differential, LDH, and immunophenotyping by flow cytometry -RTC in 1 to 2 weeks to discuss results  PLAN SUMMARY & DISPOSITION: -Labs today as above -RTC in 1 to 2 weeks  All questions were answered. The patient knows to call the clinic with any problems, questions or concerns.   Medical decision making: Moderate (1  undiagnosed new problem with uncertain prognosis, review of lab results, ordering of new labs)  Time spent on visit: I spent 40 minutes counseling the patient face to face. The total time spent in the appointment was 55 minutes and more than 50% was on counseling.  I, Tarri Abernethy PA-C, have seen this patient in conjunction with Dr. Derek Jack. Greater than 50% of visit was performed by Dr. Delton Coombes.  I, Derek Jack MD, have reviewed the above documentation for accuracy and completeness, and I agree with the above.     Derek Jack, MD 10/11/20 6:02 PM

## 2020-10-11 NOTE — Patient Instructions (Addendum)
[facility] at [facility] Discharge Instructions  You were seen today by Dr. Delton Coombes and Tarri Abernethy PA-C for your elevated white blood cell count. This may be due to Chronic Lymphocytic Leukemia (CLL). We will run tests to determine if you have CLL.  This is a slow-growing and chronic leukemia, and in most patients does not require any treatment. We will discuss further at your next appointment.  LABS: Check labs today on the first floor of the hospital.  MEDICATIONS: We have not made any changes to your medications.  FOLLOW-UP APPOINTMENT: Return to our clinic in 1-2 weeks to discuss these results.   Thank you for choosing [place] at [facility] to provide your oncology and hematology care.  To afford each patient quality time with our provider, please arrive at least 15 minutes before your scheduled appointment time.   If you have a lab appointment with the [facility] please come in thru the Main Entrance and check in at the main information desk.  You need to re-schedule your appointment should you arrive 10 or more minutes late.  We strive to give you quality time with our providers, and arriving late affects you and other patients whose appointments are after yours.  Also, if you no show three or more times for appointments you may be dismissed from the clinic at the providers discretion.     Again, thank you for choosing [facility].  Our hope is that these requests will decrease the amount of time that you wait before being seen by our physicians.       _____________________________________________________________  Should you have questions after your visit to [facility], please contact our office at [phone] and follow the prompts.  Our office hours are 8:00 a.m. and 4:30 p.m. Monday - Friday.  Please note that voicemails left after 4:00 p.m. may not be returned until the following business  day.  We are closed weekends and major holidays.  You do have access to a nurse 24-7, just call the main number to the clinic [phone] and do not press any options, hold on the line and a nurse will answer the phone.    For prescription refill requests, have your pharmacy contact our office and allow 72 hours.    Due to Covid, you will need to wear a mask upon entering the hospital. If you do not have a mask, a mask will be given to you at the Main Entrance upon arrival. For doctor visits, patients may have 1 support person age 35 or older with them. For treatment visits, patients can not have anyone with them due to social distancing guidelines and our immunocompromised population.

## 2020-10-12 ENCOUNTER — Ambulatory Visit (HOSPITAL_COMMUNITY): Payer: Medicare Other | Admitting: Hematology

## 2020-10-12 LAB — SURGICAL PATHOLOGY

## 2020-10-12 LAB — PATHOLOGIST SMEAR REVIEW

## 2020-10-14 ENCOUNTER — Other Ambulatory Visit: Payer: Self-pay | Admitting: Family Medicine

## 2020-10-25 ENCOUNTER — Inpatient Hospital Stay (HOSPITAL_COMMUNITY): Payer: Medicare Other | Attending: Physician Assistant | Admitting: Physician Assistant

## 2020-10-25 ENCOUNTER — Other Ambulatory Visit: Payer: Self-pay

## 2020-10-25 ENCOUNTER — Encounter (HOSPITAL_COMMUNITY): Payer: Self-pay | Admitting: Physician Assistant

## 2020-10-25 VITALS — BP 134/86 | HR 74 | Temp 97.1°F | Resp 18 | Wt 165.1 lb

## 2020-10-25 DIAGNOSIS — R141 Gas pain: Secondary | ICD-10-CM | POA: Insufficient documentation

## 2020-10-25 DIAGNOSIS — R140 Abdominal distension (gaseous): Secondary | ICD-10-CM | POA: Diagnosis not present

## 2020-10-25 DIAGNOSIS — K5900 Constipation, unspecified: Secondary | ICD-10-CM | POA: Diagnosis not present

## 2020-10-25 DIAGNOSIS — R61 Generalized hyperhidrosis: Secondary | ICD-10-CM | POA: Diagnosis not present

## 2020-10-25 DIAGNOSIS — Z79899 Other long term (current) drug therapy: Secondary | ICD-10-CM | POA: Insufficient documentation

## 2020-10-25 DIAGNOSIS — R110 Nausea: Secondary | ICD-10-CM | POA: Insufficient documentation

## 2020-10-25 DIAGNOSIS — C9110 Chronic lymphocytic leukemia of B-cell type not having achieved remission: Secondary | ICD-10-CM

## 2020-10-25 DIAGNOSIS — R5383 Other fatigue: Secondary | ICD-10-CM | POA: Insufficient documentation

## 2020-10-25 DIAGNOSIS — Z7951 Long term (current) use of inhaled steroids: Secondary | ICD-10-CM | POA: Diagnosis not present

## 2020-10-25 NOTE — Progress Notes (Signed)
[place], [place] [id number]   CLINIC:  Medical Oncology/Hematology  PCP:  Kathyrn Drown, MD [address] [place] [id number] [phone]   REASON FOR VISIT:  Follow-up for CLL  CURRENT THERAPY: Under work-up  INTERVAL HISTORY:  [name] 70 y.o. male returns for routine follow-up of lymphocytosis suspected to be CLL.  Patient was initially seen on 10/11/2020 after being referred by his PCP for chronic lymphocytosis since 2019.  At initial visit, patient complained of mild fatigue, occasional night sweats.  No unintentional weight loss, fever, chills.  No lymphadenopathy.  Complaint of having constipation, intermittent gas pain, and occasional nausea and bloating - scheduled to follow-up with GI 11/07/2020.  Diagnostic work-up ordered at last visit confirms diagnosis of chronic lymphocytic leukemia, further discussed below.  Since last visit, patient reports that he feels the same.  No new symptoms.  No new lumps or bumps, B symptoms, or infections.  He has 75% energy and 100% appetite. He endorses that he is maintaining a stable weight.    REVIEW OF SYSTEMS:  Review of Systems  Constitutional: Positive for fatigue (Mild fatigue). Negative for appetite change, chills, diaphoresis, fever and unexpected weight change.  HENT:   Negative for lump/mass and nosebleeds.   Eyes: Negative for eye problems.  Respiratory: Negative for cough, hemoptysis and shortness of breath.   Cardiovascular: Negative for chest pain, leg swelling and palpitations.  Gastrointestinal: Negative for abdominal pain, blood in stool, constipation, diarrhea, nausea and vomiting.  Genitourinary: Negative for hematuria.   Skin: Negative.   Neurological: Negative for dizziness, headaches and light-headedness.  Hematological: Does not bruise/bleed easily.      PAST MEDICAL/SURGICAL HISTORY:  Past Medical History:  Diagnosis Date  . Cancer ([place]) 09/2009    prostate  . Hemorrhoids   . Incontinence of feces   . Rectal pain   . Toenail fungus 03/28/2020   I would not recommend oral medication due to liver toxicity potential, treating topically  . White coat hypertension    Past Surgical History:  Procedure Laterality Date  . COLONOSCOPY  2006 and 2012  . COLONOSCOPY  08/04/2010  . PROSTATECTOMY     2011     SOCIAL HISTORY:  Social History   Socioeconomic History  . Marital status: Single    Spouse name: Not on file  . Number of children: Not on file  . Years of education: Not on file  . Highest education level: Not on file  Occupational History  . Not on file  Tobacco Use  . Smoking status: Never Smoker  . Smokeless tobacco: Current User    Types: Snuff  Vaping Use  . Vaping Use: Never used  Substance and Sexual Activity  . Alcohol use: Never  . Drug use: Never  . Sexual activity: Not on file  Other Topics Concern  . Not on file  Social History Narrative  . Not on file   Social Determinants of Health   Financial Resource Strain: Not on file  Food Insecurity: Not on file  Transportation Needs: No Transportation Needs  . Lack of Transportation (Medical): No  . Lack of Transportation (Non-Medical): No  Physical Activity: Inactive  . Days of Exercise per Week: 0 days  . Minutes of Exercise per Session: 0 min  Stress: Not on file  Social Connections: Not on file  Intimate Partner Violence: Not At Risk  . Fear of Current or Ex-Partner: No  . Emotionally  Abused: No  . Physically Abused: No  . Sexually Abused: No    FAMILY HISTORY:  Family History  Problem Relation Age of Onset  . Cancer Sister        breast  . Hypertension Brother   . Diabetes Brother   . Emphysema Mother   . Asthma Father   . Allergic rhinitis Father   . Angioedema Neg Hx   . Atopy Neg Hx   . Eczema Neg Hx   . Immunodeficiency Neg Hx   . Urticaria Neg Hx     CURRENT MEDICATIONS:  Outpatient Encounter Medications as of 10/25/2020   Medication Sig Note  . acetaminophen (TYLENOL) 500 MG tablet Take 1,000 mg by mouth at bedtime as needed.   Marland Kitchen amLODipine (NORVASC) 2.5 MG tablet Take 1 tablet (2.5 mg total) by mouth daily.   Marland Kitchen azelastine (OPTIVAR) 0.05 % ophthalmic solution Apply 1 drop to eye 2 (two) times daily.   . cholecalciferol (VITAMIN D) 1000 UNITS tablet Take 1,000 Units by mouth daily.   . Cyanocobalamin (VITAMIN B 12) 100 MCG LOZG Take 1,000 mg by mouth. 05/11/2014: Received from: [facility]  . fluticasone (FLONASE) 50 MCG/ACT nasal spray SPRAY 2 SPRAYS INTO EACH NOSTRIL EVERY DAY   . FLUZONE HIGH-DOSE QUADRIVALENT 0.7 ML SUSY    . loratadine (CLARITIN) 10 MG tablet Take 10 mg by mouth daily. (Patient not taking: Reported on 10/08/2020)   . mometasone (ELOCON) 0.1 % cream Apply 1 application topically 2 (two) times daily. To finger rash   . mupirocin ointment (BACTROBAN) 2 % Apply 1 application topically 2 (two) times daily. For 10 days to finger blister   . omeprazole (PRILOSEC) 20 MG capsule Take 1 capsule (20 mg total) by mouth daily.   . Polyvinyl Alcohol-Povidone (REFRESH OP) Apply to eye.   . sucralfate (CARAFATE) 1 g tablet Take by mouth.   . triamcinolone cream (KENALOG) 0.1 % Apply bid prn to finger    No facility-administered encounter medications on file as of 10/25/2020.    ALLERGIES:  Allergies  Allergen Reactions  . Codeine   . Morphine And Related   . Penicillins      PHYSICAL EXAM:  ECOG PERFORMANCE STATUS: 1 - Symptomatic but completely ambulatory  There were no vitals filed for this visit. There were no vitals filed for this visit. Physical Exam Constitutional:      Appearance: Normal appearance.  HENT:     Head: Normocephalic and atraumatic.     Mouth/Throat:     Mouth: Mucous membranes are moist.  Eyes:     Extraocular Movements: Extraocular movements intact.     Pupils: Pupils are equal, round, and reactive to light.  Cardiovascular:     Rate and Rhythm: Normal rate and  regular rhythm.     Pulses: Normal pulses.     Heart sounds: Normal heart sounds.  Pulmonary:     Effort: Pulmonary effort is normal.     Breath sounds: Normal breath sounds.  Chest:  Breasts:     Right: No axillary adenopathy or supraclavicular adenopathy.     Left: No axillary adenopathy or supraclavicular adenopathy.    Abdominal:     General: Bowel sounds are normal.     Palpations: Abdomen is soft.     Tenderness: There is no abdominal tenderness.  Musculoskeletal:        General: No swelling.     Right lower leg: No edema.     Left lower leg: No edema.  Lymphadenopathy:     Head:     Right side of head: No submental, submandibular, tonsillar, preauricular, posterior auricular or occipital adenopathy.     Left side of head: No submental, submandibular, tonsillar, preauricular, posterior auricular or occipital adenopathy.     Cervical: No cervical adenopathy.     Right cervical: No superficial, deep or posterior cervical adenopathy.    Left cervical: No superficial, deep or posterior cervical adenopathy.     Upper Body:     Right upper body: No supraclavicular or axillary adenopathy.     Left upper body: No supraclavicular or axillary adenopathy.     Lower Body: No right inguinal adenopathy. Left inguinal adenopathy present.  Skin:    General: Skin is warm and dry.  Neurological:     General: No focal deficit present.     Mental Status: He is alert and oriented to person, place, and time.  Psychiatric:        Mood and Affect: Mood normal.        Behavior: Behavior normal.      LABORATORY DATA:  I have reviewed the labs as listed.  CBC    Component Value Date/Time   WBC 16.7 (H) 10/11/2020 1040   RBC 4.78 10/11/2020 1040   HGB 14.4 10/11/2020 1040   HGB 14.6 10/01/2020 0906   HCT 44.2 10/11/2020 1040   HCT 42.8 10/01/2020 0906   PLT 221 10/11/2020 1040   PLT 217 10/01/2020 0906   MCV 92.5 10/11/2020 1040   MCV 88 10/01/2020 0906   MCH 30.1 10/11/2020 1040    MCHC 32.6 10/11/2020 1040   RDW 12.7 10/11/2020 1040   RDW 12.5 10/01/2020 0906   LYMPHSABS 12.7 (H) 10/11/2020 1040   LYMPHSABS 12.1 (H) 10/01/2020 0906   MONOABS 0.5 10/11/2020 1040   EOSABS 0.0 10/11/2020 1040   EOSABS 0.1 10/01/2020 0906   BASOSABS 0.0 10/11/2020 1040   BASOSABS 0.1 10/01/2020 0906   CMP Latest Ref Rng & Units 08/02/2020 01/09/2020 01/10/2019  Glucose 65 - 99 mg/dL 101(H) 95 96  BUN 8 - 27 mg/dL 9 9 11   Creatinine 0.76 - 1.27 mg/dL 0.99 0.85 0.87  Sodium 134 - 144 mmol/L 142 140 140  Potassium 3.5 - 5.2 mmol/L 3.7 4.0 4.4  Chloride 96 - 106 mmol/L 105 105 102  CO2 20 - 29 mmol/L 24 23 24   Calcium 8.6 - 10.2 mg/dL 9.2 9.3 9.4  Total Protein 6.0 - 8.5 g/dL 6.6 6.9 -  Total Bilirubin 0.0 - 1.2 mg/dL 0.6 1.1 -  Alkaline Phos 44 - 121 IU/L 80 79 -  AST 0 - 40 IU/L 17 18 -  ALT 0 - 44 IU/L 12 13 -    DIAGNOSTIC IMAGING:  I have independently reviewed the relevant imaging and discussed with the patient.  ASSESSMENT: 1.  Chronic lymphocytic leukemia -Lymphocytosis first noted by PCP in 2019 -CLL officially diagnosed in March 2022, per the following labs obtained 10/11/2020:  Pathologist smear review: Absolute lymphocytosis, smudge cells on differential  Flow cytology (via surgical pathology): Monoclonal B-cell population identified consistent with chronic lymphocytic leukemia, with phenotype of abnormal cells being CD5, CD19, CD20, CD 200, lambda -Rai stage (lymphocytosis alone, no lymphadenopathy or palpable hepatosplenomegaly, no anemia, no thrombocytopenia) - low risk, average overall survival greater than 150 months -Binet stage A (OS same as age-matched controls) -No anemia or thrombocytopenia to date -No palpable lymphadenopathy, splenomegaly, or hepatomegaly on exam -Abdominal ultrasound obtained 09/07/2020 was negative for splenomegaly -Most  recent labs (10/11/2020): LDH normal at 133, stable leukocytosis with WBC 16.7, stable lymphocytosis with absolute  lymphocytes 12.7  2.  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  Chronic lymphocytic leukemia -At this time, patient has low risk CLL - no treatment indicated other than close clinical observation -Due to the fact that this is a recent diagnosis, will see the patient again in 3 months to recheck labs (CBC, LDH) -Patient informed to call our office if he notices any new lymphadenopathy, unintentional weight loss, severe fatigue, ongoing fever or night sweats, or any other new or worsening symptoms that concern him  PLAN SUMMARY & DISPOSITION: -Repeat CBC/LDH in 3 months -RTC in 3 months for follow-up visit  All questions were answered. The patient knows to call the clinic with any problems, questions or concerns.  Medical decision making: Moderate (diagnosis of new condition, ordering further testing)  Time spent on visit: Significant portion of time was spent during this visit explaining diagnosis to patient with high-quality patient education.  I spent 40 minutes counseling the patient face to face. The total time spent in the appointment was 55 minutes and more than 50% was on counseling.   Harriett Rush, PA-C  10/25/20 9:07 AM

## 2020-10-25 NOTE — Patient Instructions (Addendum)
[place] at [facility] Discharge Instructions  You were seen today by Tarri Abernethy PA-C for your chronic lymphocytic leukemia.    LABS: Return in 3 months for labs   MEDICATIONS: No changes, no additional treatment needed at this time  FOLLOW-UP APPOINTMENT: 3 months   Thank you for choosing [place] at [facility] to provide your oncology and hematology care.  To afford each patient quality time with our provider, please arrive at least 15 minutes before your scheduled appointment time.   If you have a lab appointment with the [facility] please come in thru the Main Entrance and check in at the main information desk.  You need to re-schedule your appointment should you arrive 10 or more minutes late.  We strive to give you quality time with our providers, and arriving late affects you and other patients whose appointments are after yours.  Also, if you no show three or more times for appointments you may be dismissed from the clinic at the providers discretion.     Again, thank you for choosing [facility].  Our hope is that these requests will decrease the amount of time that you wait before being seen by our physicians.       _____________________________________________________________  Should you have questions after your visit to [facility], please contact our office at [phone] and follow the prompts.  Our office hours are 8:00 a.m. and 4:30 p.m. Monday - Friday.  Please note that voicemails left after 4:00 p.m. may not be returned until the following business day.  We are closed weekends and major holidays.  You do have access to a nurse 24-7, just call the main number to the clinic [phone] and do not press any options, hold on the line and a nurse will answer the phone.    For prescription refill requests, have your pharmacy contact our office and allow 72 hours.    Due to Covid, you  will need to wear a mask upon entering the hospital. If you do not have a mask, a mask will be given to you at the Main Entrance upon arrival. For doctor visits, patients may have 1 support person age 19 or older with them. For treatment visits, patients can not have anyone with them due to social distancing guidelines and our immunocompromised population.      Chronic Lymphocytic Leukemia  Chronic lymphocytic leukemia (CLL) is a type of cancer of the blood cells and soft tissue inside bones (bone marrow). CLL happens when the bone marrow makes too many abnormal white blood cells. The cells, called leukemia cells, build up in the blood but do not function normally. Eventually they crowd out other healthy blood cells. CLL usually gets worse slowly. It can cause complications in your organs, such as in your spleen. It can also weaken your body's defense system (immune system), and may lead to conditions in which your immune system attacks your body (autoimmune conditions). What are the causes? The cause of this condition is not known. What increases the risk? You are more likely to develop this condition if:  You are older than age 71.  You are male.  You have a family history of CLL or other cancers of the lymph system.  You have been exposed to certain chemicals, such as: ? Insecticides. ? Herbicides. What are the signs or symptoms? At first, there may be no symptoms. After a while, symptoms  may include:  Painless, swollen lymph nodes.  Feeling more tired than usual, even after rest.  Unplanned weight loss.  Heavy sweating at night.  Fever.  Shortness of breath.  Paleness.  A feeling of fullness in the upper left part of the abdomen.  Easy bruising or bleeding.  Small, flat, dark red spots under skin.  Frequent infections. How is this diagnosed? This condition is diagnosed based on:  A physical exam to check for an enlarged spleen, liver, or lymph nodes.  Blood and  bone marrow tests to check for leukemia cells. Tests may include: ? A complete blood count. ? Flow cytometry. This test uses light sensors and dyes to check the number of cells, as well as their size, structure, and general health. ? Immunophenotyping. This test identifies specific antibodies that are found in white blood cells. The test is used when a complete blood count shows the presence of immature cells or a high number of white blood cells. ? Fluorescence in situ hybridization (FISH). This test checks for defects in chromosomes and how those defects affect the functioning of the cell. Results from a [place] test will be used to determine treatment and assess the outcome of that treatment.  A CT scan to check for swelling or anything abnormal in your spleen, liver, and lymph nodes.   How is this treated? Treatment for this condition depends on the stage of the leukemia and whether you have symptoms. Treatment may include:  Observation.  Chemotherapy medicines. These are medicines that kill leukemia cells.  Targeted chemotherapy. These are medicines that try to stop the growth of leukemia cells. They identify and attack specific leukemia cells without harming normal cells.  Radiation.  Surgery to remove the spleen.  Biological therapy (immunotherapy). This treatment boosts the ability of your immune system to fight the leukemia cells.  Bone marrow or peripheral blood stem cell transplant. This treatment replaces your own bone marrow or stem cells with bone marrow or stem cells from a donor.  New treatments through clinical trials. These are also called experimental treatments. Additional medicines may be needed to help manage symptoms and side effects of treatment. Follow these instructions at home: Medicines  Take over-the-counter and prescription medicines only as told by your health care provider.  If you were prescribed an antibiotic medicine, take it as told by your health care  provider. Do not stop taking the antibiotic even if you start to feel better. If you are on chemotherapy:  Wash your hands often, especially before meals, after being outside, and after using the toilet. Have visitors do the same.  Keep your teeth and gums clean and well cared for. Brush regularly. Use soft toothbrushes.  Protect your skin from the sun by using sunscreen and wearing protective clothing.  Make sure that your family members get a flu shot (influenza vaccine) every year. General instructions  Avoid contact sports or other rough activities. Ask your health care provider what activities are safe for you.  Avoid crowded places and people who are sick.  Tell your cancer care team if you develop side effects. They may be able to recommend ways to relieve them.  Try to eat regular, healthy meals. Some of your treatments might affect your appetite.  Find healthy ways of coping with stress, such as by doing yoga or meditation or by joining a support group.  Keep all follow-up visits as told by your health care provider. This is important. Where to find more information  American Cancer Society: www.cancer.org  Leukemia and Lymphoma Society: PreviewPal.pl  National Cancer Institute ([place]): www.cancer.gov Contact a health care provider if you:  Have pain in your abdomen.  Develop new bruises that are getting bigger.  Have painful or more swollen lymph nodes.  Develop bleeding from your gums or nose.  Cannot eat or drink without vomiting.  Feel light-headed. Get help right away if you:  Have a fever or chills.  Develop chest pain.  Have trouble breathing or feel short of breath.  Faint.  See blood in your urine or stool (feces).  Have excessive bleeding.  Have any symptoms that are severe or uncontrolled. Summary  Chronic lymphocytic leukemia (CLL) is a type of cancer of the blood cells and bone marrow.  This condition can cause an enlarged spleen,  swollen lymph nodes, a weakened immune system, low red blood cell and platelets counts, and autoimmune conditions.  Treatment for this condition depends on the stage of the cancer and whether you have symptoms.  Chemotherapy, radiation, surgery to remove the spleen, and bone marrow transplant are some of the ways to treat CLL.  Keep all follow-up visits as told by your health care provider. This is important. This information is not intended to replace advice given to you by your health care provider. Make sure you discuss any questions you have with your health care provider. Document Revised: 02/06/2019 Document Reviewed: 02/06/2019 Elsevier Patient Education  [place].

## 2020-11-01 ENCOUNTER — Ambulatory Visit (HOSPITAL_COMMUNITY): Payer: Medicare Other | Admitting: Physician Assistant

## 2020-11-04 ENCOUNTER — Ambulatory Visit (INDEPENDENT_AMBULATORY_CARE_PROVIDER_SITE_OTHER): Payer: Medicare Other | Admitting: Gastroenterology

## 2020-11-04 ENCOUNTER — Other Ambulatory Visit: Payer: Self-pay

## 2020-11-04 ENCOUNTER — Telehealth (INDEPENDENT_AMBULATORY_CARE_PROVIDER_SITE_OTHER): Payer: Self-pay | Admitting: *Deleted

## 2020-11-04 ENCOUNTER — Encounter (INDEPENDENT_AMBULATORY_CARE_PROVIDER_SITE_OTHER): Payer: Self-pay | Admitting: Gastroenterology

## 2020-11-04 ENCOUNTER — Encounter (INDEPENDENT_AMBULATORY_CARE_PROVIDER_SITE_OTHER): Payer: Self-pay | Admitting: *Deleted

## 2020-11-04 VITALS — BP 136/85 | HR 74 | Temp 98.7°F | Ht 69.0 in | Wt 164.0 lb

## 2020-11-04 DIAGNOSIS — K760 Fatty (change of) liver, not elsewhere classified: Secondary | ICD-10-CM

## 2020-11-04 DIAGNOSIS — R140 Abdominal distension (gaseous): Secondary | ICD-10-CM | POA: Insufficient documentation

## 2020-11-04 MED ORDER — PEG 3350-KCL-NA BICARB-NACL 420 G PO SOLR: 4000.0000 mL | Freq: Once | ORAL | 0 refills | Status: AC

## 2020-11-04 NOTE — Progress Notes (Signed)
[name], M.D. Gastroenterology & Hepatology [facility] [address] [place], [place] [address] Primary Care Physician: Kathyrn Drown, MD [place] [address]  Referring MD: PCP  Chief Complaint: Bloating  History of Present Illness: David Pham is a 70 y.o. male with PM CLL, prostate cancer s/p surgery, and whitecoat hypertension, who presents for evaluation of bloating.  Patient reports that for the last 4 months he has presented episodes of bloating in the upper abdomen. He reports that the bloating episodes happened after eating and caused discomfort in his upper abdomen. He reports also having very occasional episodes of pressure in his chest followed by belching and some "gassy sensation" in his chest. States that for the last 2 weeks the bloating has been slightly better. He reports that "he was prescribed a medication to take 3 times a day for 10 days for possible ulcers". He reports that he felt better after finishing the medication. I see in the previous notes that he was prescribed Carafate TID.  Patient has been taking omeprazole 20 mg every day for GERD, has taken it compliantly with adequate control of his heartburn.  He does not have any heartburn symptoms at the moment.  Patient had negative serology for H. Pylori recently.  Has been moving his bowels 1-2 times per day.  He recently underwent a liver ultrasound that showed the following findings: Liver US 09/07/20 - IMPRESSION: Probable fatty infiltration of liver. Small hepatic and RIGHT renal cysts. Suspected medical renal disease changes of both kidneys.  Most recent CMP from 08/02/2020 showed an AST of 17, ALT of 12, total bilirubin of 0.6 and alkaline phosphatase of 80.  The patient denies having any nausea, vomiting, fever, chills, hematochezia, melena, hematemesis, diarrhea, jaundice, pruritus or weight loss.  Last  EGD:2009 - Normal: Proximal Esophagus to Duodenal 2nd Portion.  - Dilation: Proximal Esophagus. for dysphagia without stricture. Maloney dilator used, Diameter: 18 mm, Minimal Resistance, No Heme present on extraction. 1  total dilators used. Outcome: successful.  Last Colonoscopy:2012 - hemorrhoids  FHx: neg for any gastrointestinal/liver disease, no malignancies Social: neg smoking, alcohol or illicit drug use Surgical: no abdominal surgeries  Past Medical History: Past Medical History:  Diagnosis Date  . Cancer ([place]) 09/2009   prostate  . Hemorrhoids   . Incontinence of feces   . Rectal pain   . Toenail fungus 03/28/2020   I would not recommend oral medication due to liver toxicity potential, treating topically  . White coat hypertension     Past Surgical History: Past Surgical History:  Procedure Laterality Date  . COLONOSCOPY  2006 and 2012  . COLONOSCOPY  08/04/2010  . PROSTATECTOMY     2011    Family History: Family History  Problem Relation Age of Onset  . Cancer Sister        breast  . Hypertension Brother   . Diabetes Brother   . Emphysema Mother   . Asthma Father   . Allergic rhinitis Father   . Angioedema Neg Hx   . Atopy Neg Hx   . Eczema Neg Hx   . Immunodeficiency Neg Hx   . Urticaria Neg Hx     Social History: Social History   Tobacco Use  Smoking Status Never Smoker  Smokeless Tobacco Current User  . Types: Snuff   Social History   Substance and Sexual Activity  Alcohol Use Never   Social History   Substance  and Sexual Activity  Drug Use Never    Allergies: Allergies  Allergen Reactions  . Codeine   . Morphine And Related   . Penicillins     Medications: Current Outpatient Medications  Medication Sig Dispense Refill  . acetaminophen (TYLENOL) 500 MG tablet Take 1,000 mg by mouth at bedtime as needed.    Marland Kitchen amLODipine (NORVASC) 2.5 MG tablet Take 1 tablet (2.5 mg total) by mouth daily. 90 tablet 1  . azelastine (OPTIVAR)  0.05 % ophthalmic solution Apply 1 drop to eye 2 (two) times daily.    . cholecalciferol (VITAMIN D) 1000 UNITS tablet Take 1,000 Units by mouth daily.    . Cyanocobalamin (VITAMIN B 12) 100 MCG LOZG Take 1,000 mg by mouth.    . fexofenadine (ALLEGRA) 180 MG tablet Take 180 mg by mouth daily.    . fluticasone (FLONASE) 50 MCG/ACT nasal spray SPRAY 2 SPRAYS INTO EACH NOSTRIL EVERY DAY 48 mL 1  . omeprazole (PRILOSEC) 20 MG capsule Take 1 capsule (20 mg total) by mouth daily. 90 capsule 1  . Polyvinyl Alcohol-Povidone (REFRESH OP) Apply to eye.     No current facility-administered medications for this visit.    Review of Systems: GENERAL: negative for malaise, night sweats HEENT: No changes in hearing or vision, no nose bleeds or other nasal problems. NECK: Negative for lumps, goiter, pain and significant neck swelling RESPIRATORY: Negative for cough, wheezing CARDIOVASCULAR: Negative for chest pain, leg swelling, palpitations, orthopnea GI: SEE HPI MUSCULOSKELETAL: Negative for joint pain or swelling, back pain, and muscle pain. SKIN: Negative for lesions, rash PSYCH: Negative for sleep disturbance, mood disorder and recent psychosocial stressors. HEMATOLOGY Negative for prolonged bleeding, bruising easily, and swollen nodes. ENDOCRINE: Negative for cold or heat intolerance, polyuria, polydipsia and goiter. NEURO: negative for tremor, gait imbalance, syncope and seizures. The remainder of the review of systems is noncontributory.   Physical Exam: BP 136/85 (BP Location: Left Arm, Patient Position: Sitting, Cuff Size: Small)   Pulse 74   Temp 98.7 F (37.1 C) (Oral)   Ht 5\' 9"  (1.753 m)   Wt 164 lb (74.4 kg)   BMI 24.22 kg/m  GENERAL: The patient is AO x3, in no acute distress. HEENT: Head is normocephalic and atraumatic. EOMI are intact. Mouth is well hydrated and without lesions. NECK: Supple. No masses LUNGS: Clear to auscultation. No presence of rhonchi/wheezing/rales.  Adequate chest expansion HEART: RRR, normal s1 and s2. ABDOMEN: Soft, nontender, no guarding, no peritoneal signs, and nondistended. BS +. No masses. EXTREMITIES: Without any cyanosis, clubbing, rash, lesions or edema. NEUROLOGIC: AOx3, no focal motor deficit. SKIN: no jaundice, no rashes   Imaging/Labs: as above  I personally reviewed and interpreted the available labs, imaging and endoscopic files.  Impression and Plan: David Pham is a 70 y.o. male with PM CLL, prostate cancer s/p surgery, and whitecoat hypertension, who presents for evaluation of bloating.  The patient presented sudden onset of bloating recently without any clear trigger.  Fortunately the symptoms seem to have improved for the last few days.  He has not presented with any red flag signs.  I consider the symptoms could be related to IBS but given his age we will be important to rule out any organic etiologies with an EGD.  He would benefit from implementing a low FODMAP diet to decrease the bloating episodes which he agreed to implement.  I will also check celiac serologies.  He had a recent abdominal ultrasound that did not show  any major alterations besides fatty liver but no elevation of his liver enzymes were present which is inconsistent with NASH.Marland Kitchen  Finally, the patient is due for colorectal cancer screening, for which I will order a colonoscopy.  -Schedule EGD and colonoscopy -Patient was counseled about the benefit of implementing a low FODMAP to improve symptoms and recurrent episodes. A dietary list was provided to the patient.  -Check TTG Iga  All questions were answered.      [name], MD Gastroenterology and Hepatology [facility]

## 2020-11-04 NOTE — Patient Instructions (Addendum)
Schedule EGD and colonoscopy Patient was counseled about the benefit of implementing a low FODMAP to improve symptoms and recurrent episodes. A dietary list was provided to the patient.  Perform blood workup

## 2020-11-04 NOTE — H&P (View-Only) (Signed)
David Pham, M.D. Gastroenterology & Hepatology [facility] [address] [place], [place] [address] Primary Care Physician: Kathyrn Drown, MD [place] [address]  Referring MD: PCP  Chief Complaint: Bloating  History of Present Illness: David Pham is a 70 y.o. male with PM CLL, prostate cancer s/p surgery, and whitecoat hypertension, who presents for evaluation of bloating.  Patient reports that for the last 4 months he has presented episodes of bloating in the upper abdomen. He reports that the bloating episodes happened after eating and caused discomfort in his upper abdomen. He reports also having very occasional episodes of pressure in his chest followed by belching and some "gassy sensation" in his chest. States that for the last 2 weeks the bloating has been slightly better. He reports that "he was prescribed a medication to take 3 times a day for 10 days for possible ulcers". He reports that he felt better after finishing the medication. I see in the previous notes that he was prescribed Carafate TID.  Patient has been taking omeprazole 20 mg every day for GERD, has taken it compliantly with adequate control of his heartburn.  He does not have any heartburn symptoms at the moment.  Patient had negative serology for H. Pylori recently.  Has been moving his bowels 1-2 times per day.  He recently underwent a liver ultrasound that showed the following findings: Liver US 09/07/20 - IMPRESSION: Probable fatty infiltration of liver. Small hepatic and RIGHT renal cysts. Suspected medical renal disease changes of both kidneys.  Most recent CMP from 08/02/2020 showed an AST of 17, ALT of 12, total bilirubin of 0.6 and alkaline phosphatase of 80.  The patient denies having any nausea, vomiting, fever, chills, hematochezia, melena, hematemesis, diarrhea, jaundice, pruritus or weight loss.  Last  EGD:2009 - Normal: Proximal Esophagus to Duodenal 2nd Portion.  - Dilation: Proximal Esophagus. for dysphagia without stricture. Maloney dilator used, Diameter: 18 mm, Minimal Resistance, No Heme present on extraction. 1  total dilators used. Outcome: successful.  Last Colonoscopy:2012 - hemorrhoids  FHx: neg for any gastrointestinal/liver disease, no malignancies Social: neg smoking, alcohol or illicit drug use Surgical: no abdominal surgeries  Past Medical History: Past Medical History:  Diagnosis Date  . Cancer ([place]) 09/2009   prostate  . Hemorrhoids   . Incontinence of feces   . Rectal pain   . Toenail fungus 03/28/2020   I would not recommend oral medication due to liver toxicity potential, treating topically  . White coat hypertension     Past Surgical History: Past Surgical History:  Procedure Laterality Date  . COLONOSCOPY  2006 and 2012  . COLONOSCOPY  08/04/2010  . PROSTATECTOMY     2011    Family History: Family History  Problem Relation Age of Onset  . Cancer Sister        breast  . Hypertension Brother   . Diabetes Brother   . Emphysema Mother   . Asthma Father   . Allergic rhinitis Father   . Angioedema Neg Hx   . Atopy Neg Hx   . Eczema Neg Hx   . Immunodeficiency Neg Hx   . Urticaria Neg Hx     Social History: Social History   Tobacco Use  Smoking Status Never Smoker  Smokeless Tobacco Current User  . Types: Snuff   Social History   Substance and Sexual Activity  Alcohol Use Never   Social History   Substance  and Sexual Activity  Drug Use Never    Allergies: Allergies  Allergen Reactions  . Codeine   . Morphine And Related   . Penicillins     Medications: Current Outpatient Medications  Medication Sig Dispense Refill  . acetaminophen (TYLENOL) 500 MG tablet Take 1,000 mg by mouth at bedtime as needed.    Marland Kitchen amLODipine (NORVASC) 2.5 MG tablet Take 1 tablet (2.5 mg total) by mouth daily. 90 tablet 1  . azelastine (OPTIVAR)  0.05 % ophthalmic solution Apply 1 drop to eye 2 (two) times daily.    . cholecalciferol (VITAMIN D) 1000 UNITS tablet Take 1,000 Units by mouth daily.    . Cyanocobalamin (VITAMIN B 12) 100 MCG LOZG Take 1,000 mg by mouth.    . fexofenadine (ALLEGRA) 180 MG tablet Take 180 mg by mouth daily.    . fluticasone (FLONASE) 50 MCG/ACT nasal spray SPRAY 2 SPRAYS INTO EACH NOSTRIL EVERY DAY 48 mL 1  . omeprazole (PRILOSEC) 20 MG capsule Take 1 capsule (20 mg total) by mouth daily. 90 capsule 1  . Polyvinyl Alcohol-Povidone (REFRESH OP) Apply to eye.     No current facility-administered medications for this visit.    Review of Systems: GENERAL: negative for malaise, night sweats HEENT: No changes in hearing or vision, no nose bleeds or other nasal problems. NECK: Negative for lumps, goiter, pain and significant neck swelling RESPIRATORY: Negative for cough, wheezing CARDIOVASCULAR: Negative for chest pain, leg swelling, palpitations, orthopnea GI: SEE HPI MUSCULOSKELETAL: Negative for joint pain or swelling, back pain, and muscle pain. SKIN: Negative for lesions, rash PSYCH: Negative for sleep disturbance, mood disorder and recent psychosocial stressors. HEMATOLOGY Negative for prolonged bleeding, bruising easily, and swollen nodes. ENDOCRINE: Negative for cold or heat intolerance, polyuria, polydipsia and goiter. NEURO: negative for tremor, gait imbalance, syncope and seizures. The remainder of the review of systems is noncontributory.   Physical Exam: BP 136/85 (BP Location: Left Arm, Patient Position: Sitting, Cuff Size: Small)   Pulse 74   Temp 98.7 F (37.1 C) (Oral)   Ht 5\' 9"  (1.753 m)   Wt 164 lb (74.4 kg)   BMI 24.22 kg/m  GENERAL: The patient is AO x3, in no acute distress. HEENT: Head is normocephalic and atraumatic. EOMI are intact. Mouth is well hydrated and without lesions. NECK: Supple. No masses LUNGS: Clear to auscultation. No presence of rhonchi/wheezing/rales.  Adequate chest expansion HEART: RRR, normal s1 and s2. ABDOMEN: Soft, nontender, no guarding, no peritoneal signs, and nondistended. BS +. No masses. EXTREMITIES: Without any cyanosis, clubbing, rash, lesions or edema. NEUROLOGIC: AOx3, no focal motor deficit. SKIN: no jaundice, no rashes   Imaging/Labs: as above  I personally reviewed and interpreted the available labs, imaging and endoscopic files.  Impression and Plan: David Pham is a 69 y.o. male with PM CLL, prostate cancer s/p surgery, and whitecoat hypertension, who presents for evaluation of bloating.  The patient presented sudden onset of bloating recently without any clear trigger.  Fortunately the symptoms seem to have improved for the last few days.  He has not presented with any red flag signs.  I consider the symptoms could be related to IBS but given his age we will be important to rule out any organic etiologies with an EGD.  He would benefit from implementing a low FODMAP diet to decrease the bloating episodes which he agreed to implement.  I will also check celiac serologies.  He had a recent abdominal ultrasound that did not show  any major alterations besides fatty liver but no elevation of his liver enzymes were present which is inconsistent with NASH.Marland Kitchen  Finally, the patient is due for colorectal cancer screening, for which I will order a colonoscopy.  -Schedule EGD and colonoscopy -Patient was counseled about the benefit of implementing a low FODMAP to improve symptoms and recurrent episodes. A dietary list was provided to the patient.  -Check TTG Iga  All questions were answered.      David Peppers, MD Gastroenterology and Hepatology [facility]

## 2020-11-04 NOTE — Telephone Encounter (Addendum)
Patient needs trilyte 

## 2020-11-04 NOTE — Telephone Encounter (Signed)
Prep sent

## 2020-11-05 LAB — IGA: Immunoglobulin A: 151 mg/dL (ref 70–320)

## 2020-11-05 LAB — TISSUE TRANSGLUTAMINASE, IGA: (tTG) Ab, IgA: <1 U/mL

## 2020-11-08 ENCOUNTER — Other Ambulatory Visit (INDEPENDENT_AMBULATORY_CARE_PROVIDER_SITE_OTHER): Payer: Self-pay | Admitting: *Deleted

## 2020-11-15 ENCOUNTER — Other Ambulatory Visit (HOSPITAL_COMMUNITY)
Admission: RE | Admit: 2020-11-15 | Discharge: 2020-11-15 | Disposition: A | Discharge status: Home / Self Care | Payer: Medicare Other | Source: Ambulatory Visit | Attending: Gastroenterology | Admitting: Gastroenterology

## 2020-11-15 ENCOUNTER — Other Ambulatory Visit: Payer: Self-pay

## 2020-11-15 DIAGNOSIS — Z01812 Encounter for preprocedural laboratory examination: Secondary | ICD-10-CM | POA: Diagnosis not present

## 2020-11-15 DIAGNOSIS — Z20822 Contact with and (suspected) exposure to covid-19: Secondary | ICD-10-CM | POA: Insufficient documentation

## 2020-11-16 LAB — SARS CORONAVIRUS 2 (TAT 6-24 HRS): SARS Coronavirus 2: NEGATIVE

## 2020-11-17 ENCOUNTER — Ambulatory Visit (HOSPITAL_COMMUNITY): Payer: Medicare Other | Admitting: Anesthesiology

## 2020-11-17 ENCOUNTER — Other Ambulatory Visit: Payer: Self-pay

## 2020-11-17 ENCOUNTER — Encounter (HOSPITAL_COMMUNITY): Payer: Self-pay | Admitting: Gastroenterology

## 2020-11-17 ENCOUNTER — Ambulatory Visit (HOSPITAL_COMMUNITY)
Admission: RE | Admit: 2020-11-17 | Discharge: 2020-11-17 | Disposition: A | Discharge status: Home / Self Care | Payer: Medicare Other | Attending: Gastroenterology | Admitting: Gastroenterology

## 2020-11-17 ENCOUNTER — Encounter (HOSPITAL_COMMUNITY)
Admission: RE | Disposition: A | Discharge status: Home / Self Care | Payer: Self-pay | Source: Home / Self Care | Attending: Gastroenterology

## 2020-11-17 DIAGNOSIS — K3189 Other diseases of stomach and duodenum: Secondary | ICD-10-CM | POA: Diagnosis not present

## 2020-11-17 DIAGNOSIS — Z880 Allergy status to penicillin: Secondary | ICD-10-CM | POA: Insufficient documentation

## 2020-11-17 DIAGNOSIS — Z885 Allergy status to narcotic agent status: Secondary | ICD-10-CM | POA: Insufficient documentation

## 2020-11-17 DIAGNOSIS — K648 Other hemorrhoids: Secondary | ICD-10-CM | POA: Insufficient documentation

## 2020-11-17 DIAGNOSIS — K449 Diaphragmatic hernia without obstruction or gangrene: Secondary | ICD-10-CM

## 2020-11-17 DIAGNOSIS — Z8546 Personal history of malignant neoplasm of prostate: Secondary | ICD-10-CM | POA: Insufficient documentation

## 2020-11-17 DIAGNOSIS — Z79899 Other long term (current) drug therapy: Secondary | ICD-10-CM | POA: Insufficient documentation

## 2020-11-17 DIAGNOSIS — K319 Disease of stomach and duodenum, unspecified: Secondary | ICD-10-CM | POA: Insufficient documentation

## 2020-11-17 DIAGNOSIS — Z139 Encounter for screening, unspecified: Secondary | ICD-10-CM | POA: Diagnosis not present

## 2020-11-17 DIAGNOSIS — K219 Gastro-esophageal reflux disease without esophagitis: Secondary | ICD-10-CM | POA: Insufficient documentation

## 2020-11-17 DIAGNOSIS — Z1211 Encounter for screening for malignant neoplasm of colon: Secondary | ICD-10-CM | POA: Diagnosis not present

## 2020-11-17 DIAGNOSIS — K5730 Diverticulosis of large intestine without perforation or abscess without bleeding: Secondary | ICD-10-CM | POA: Diagnosis not present

## 2020-11-17 DIAGNOSIS — K259 Gastric ulcer, unspecified as acute or chronic, without hemorrhage or perforation: Secondary | ICD-10-CM | POA: Insufficient documentation

## 2020-11-17 DIAGNOSIS — R140 Abdominal distension (gaseous): Secondary | ICD-10-CM | POA: Diagnosis not present

## 2020-11-17 DIAGNOSIS — Z856 Personal history of leukemia: Secondary | ICD-10-CM | POA: Insufficient documentation

## 2020-11-17 DIAGNOSIS — R030 Elevated blood-pressure reading, without diagnosis of hypertension: Secondary | ICD-10-CM | POA: Diagnosis not present

## 2020-11-17 DIAGNOSIS — F17200 Nicotine dependence, unspecified, uncomplicated: Secondary | ICD-10-CM | POA: Diagnosis not present

## 2020-11-17 HISTORY — PX: COLONOSCOPY WITH PROPOFOL: SHX5780

## 2020-11-17 HISTORY — PX: BIOPSY: SHX5522

## 2020-11-17 HISTORY — PX: ESOPHAGOGASTRODUODENOSCOPY (EGD) WITH PROPOFOL: SHX5813

## 2020-11-17 SURGERY — COLONOSCOPY WITH PROPOFOL
Anesthesia: General

## 2020-11-17 MED ORDER — EPHEDRINE SULFATE 50 MG/ML IJ SOLN
INTRAMUSCULAR | Status: DC | PRN
  Administered 2020-11-17 (×2): 10 mg via INTRAVENOUS

## 2020-11-17 MED ORDER — STERILE WATER FOR IRRIGATION IR SOLN
Status: DC | PRN
  Administered 2020-11-17: 1.5 mL

## 2020-11-17 MED ORDER — LACTATED RINGERS IV SOLN: INTRAVENOUS | Status: DC

## 2020-11-17 MED ORDER — PROPOFOL 10 MG/ML IV BOLUS
INTRAVENOUS | Status: DC | PRN
  Administered 2020-11-17 (×2): 50 mg via INTRAVENOUS

## 2020-11-17 MED ORDER — PROPOFOL 1000 MG/100ML IV EMUL
INTRAVENOUS | Status: AC
  Filled 2020-11-17: qty 200

## 2020-11-17 MED ORDER — PROPOFOL 500 MG/50ML IV EMUL
INTRAVENOUS | Status: DC | PRN
  Administered 2020-11-17: 150 ug/kg/min via INTRAVENOUS

## 2020-11-17 NOTE — Op Note (Signed)
[facility] Patient Name: David Pham Procedure Date: 11/17/2020 8:11 AM MRN: [id number] Date of Birth: 01-Sep-1950 Attending MD: [name] ,  CSN: [id number] Age: 70 Admit Type: Outpatient Procedure:                Upper GI endoscopy Indications:              Abdominal bloating Providers:                [name], Caprice Kluver, Randa Spike,                            Technician Referring MD:              Medicines:                Monitored Anesthesia Care Complications:            No immediate complications. Estimated Blood Loss:     Estimated blood loss: none. Procedure:                Pre-Anesthesia Assessment:                           - Prior to the procedure, a History and Physical                            was performed, and patient medications, allergies                            and sensitivities were reviewed. The patient's                            tolerance of previous anesthesia was reviewed.                           - The risks and benefits of the procedure and the                            sedation options and risks were discussed with the                            patient. All questions were answered and informed                            consent was obtained.                           - ASA Grade Assessment: II - A patient with mild                            systemic disease.                           After obtaining informed consent, the endoscope was                            passed under direct vision. Throughout the  procedure, the patient's blood pressure, pulse, and                            oxygen saturations were monitored continuously. The                            [id number]-H190 ([id number]) scope was introduced through the                            mouth, and advanced to the second part of duodenum.                            The upper GI endoscopy was accomplished without                             difficulty. The patient tolerated the procedure                            well. Scope In: 8:46:09 AM Scope Out: 8:55:05 AM Total Procedure Duration: 0 hours 8 minutes 56 seconds  Findings:      A 2 cm hiatal hernia was present.      A single localized 5 mm erosion with no stigmata of recent bleeding was       found in the gastric body. Biopsies were taken with a cold forceps for       histology.      The examined duodenum was normal. Biopsies for histology were taken with       a cold forceps for evaluation of celiac disease. Impression:               - 2 cm hiatal hernia.                           - Erosive gastropathy with no stigmata of recent                            bleeding. Biopsied.                           - Normal examined duodenum. Biopsied. Moderate Sedation:      Per Anesthesia Care Recommendation:           - Discharge patient to home (ambulatory).                           - Resume previous diet.                           - Await pathology results.                           - Continue omeprazole 20 mg qday. Procedure Code(s):        --- Professional ---                           [phone], Esophagogastroduodenoscopy, flexible,  transoral; with biopsy, single or multiple Diagnosis Code(s):        --- Professional ---                           K44.9, Diaphragmatic hernia without obstruction or                            gangrene                           K31.89, Other diseases of stomach and duodenum                           R14.0, Abdominal distension (gaseous) CPT copyright 2019 American Medical Association. All rights reserved. The codes documented in this report are preliminary and upon coder review may  be revised to meet current compliance requirements. [name], MD [name],  11/17/2020 9:01:29 AM This report has been signed electronically. Number of Addenda: 0

## 2020-11-17 NOTE — Transfer of Care (Signed)
Immediate Anesthesia Transfer of Care Note  Patient: David Pham  Procedure(s) Performed: COLONOSCOPY WITH PROPOFOL (N/A ) ESOPHAGOGASTRODUODENOSCOPY (EGD) WITH PROPOFOL (N/A ) BIOPSY  Patient Location: PACU  Anesthesia Type:General  Level of Consciousness: awake, alert , oriented and patient cooperative  Airway & Oxygen Therapy: Patient Spontanous Breathing  Post-op Assessment: Report given to RN, Post -op Vital signs reviewed and stable and Patient moving all extremities X 4  Post vital signs: Reviewed and stable  Last Vitals:  Vitals Value Taken Time  BP    Temp    Pulse    Resp    SpO2      Last Pain:  Vitals:   11/17/20 0840  TempSrc:   PainSc: 0-No pain      Patients Stated Pain Goal: 7 (10/11/20 4825)  Complications: No complications documented.

## 2020-11-17 NOTE — Anesthesia Preprocedure Evaluation (Addendum)
Anesthesia Evaluation  Patient identified by MRN, date of birth, ID band Patient awake    Reviewed: Allergy & Precautions, NPO status , Patient's Chart, lab work & pertinent test results  Airway Mallampati: I  TM Distance: >3 FB Neck ROM: Full   Comment: Neck pain Dental  (+) Dental Advisory Given, Missing   Pulmonary neg pulmonary ROS,    Pulmonary exam normal breath sounds clear to auscultation       Cardiovascular Exercise Tolerance: Good hypertension, Pt. on medications Normal cardiovascular exam Rhythm:Regular Rate:Normal     Neuro/Psych negative neurological ROS  negative psych ROS   GI/Hepatic Neg liver ROS, GERD  Medicated,  Endo/Other  negative endocrine ROS  Renal/GU negative Renal ROS   Prostate cancer    Musculoskeletal  (+) Arthritis  (back pain, neck pain),   Abdominal   Peds  Hematology negative hematology ROS (+)   Anesthesia Other Findings   Reproductive/Obstetrics                            Anesthesia Physical Anesthesia Plan  ASA: II  Anesthesia Plan: General   Post-op Pain Management:    Induction: Intravenous  PONV Risk Score and Plan: Propofol infusion  Airway Management Planned: Nasal Cannula and Natural Airway  Additional Equipment:   Intra-op Plan:   Post-operative Plan:   Informed Consent: I have reviewed the patients History and Physical, chart, labs and discussed the procedure including the risks, benefits and alternatives for the proposed anesthesia with the patient or authorized representative who has indicated his/her understanding and acceptance.     Dental advisory given  Plan Discussed with: CRNA and Surgeon  Anesthesia Plan Comments:         Anesthesia Quick Evaluation

## 2020-11-17 NOTE — Op Note (Signed)
[facility] Patient Name: David Pham Procedure Date: 11/17/2020 8:43 AM MRN: [id number] Date of Birth: 1951-03-04 Attending MD: [name] ,  CSN: [id number] Age: 70 Admit Type: Outpatient Procedure:                Colonoscopy Indications:              Screening for colorectal malignant neoplasm Providers:                [name], Caprice Kluver, Randa Spike,                            Technician Referring MD:              Medicines:                Monitored Anesthesia Care Complications:            No immediate complications. Estimated Blood Loss:     Estimated blood loss: none. Procedure:                Pre-Anesthesia Assessment:                           - Prior to the procedure, a History and Physical                            was performed, and patient medications, allergies                            and sensitivities were reviewed. The patient's                            tolerance of previous anesthesia was reviewed.                           - The risks and benefits of the procedure and the                            sedation options and risks were discussed with the                            patient. All questions were answered and informed                            consent was obtained.                           - ASA Grade Assessment: II - A patient with mild                            systemic disease.                           After obtaining informed consent, the colonoscope                            was passed under direct vision. Throughout the  procedure, the patient's blood pressure, pulse, and                            oxygen saturations were monitored continuously.The                            colonoscopy was performed without difficulty. The                            patient tolerated the procedure well. The quality                            of the bowel preparation was good. The                             [id number]) was introduced through the anus                            and advanced to the the terminal ileum. Scope In: 9:02:26 AM Scope Out: 9:18:26 AM Scope Withdrawal Time: 0 hours 12 minutes 31 seconds  Total Procedure Duration: 0 hours 16 minutes 0 seconds  Findings:      The perianal and digital rectal examinations were normal.      The terminal ileum appeared normal.      Multiple small-mouthed diverticula were found in the sigmoid colon.      Non-bleeding internal hemorrhoids were found during retroflexion. The       hemorrhoids were small. Impression:               - The examined portion of the ileum was normal.                           - Diverticulosis in the sigmoid colon.                           - Non-bleeding internal hemorrhoids.                           - No specimens collected. Moderate Sedation:      Per Anesthesia Care Recommendation:           - Discharge patient to home (ambulatory).                           - Resume previous diet.                           - Repeat colonoscopy in 10 years for screening                            purposes. Procedure Code(s):        --- Professional ---                           H8299, Colorectal cancer screening; colonoscopy on  individual not meeting criteria for high risk Diagnosis Code(s):        --- Professional ---                           Z12.11, Encounter for screening for malignant                            neoplasm of colon                           K64.8, Other hemorrhoids                           K57.30, Diverticulosis of large intestine without                            perforation or abscess without bleeding CPT copyright 2019 American [employer] Association. All rights reserved. The codes documented in this report are preliminary and upon coder review may  be revised to meet current compliance requirements. [name], MD [name],  11/17/2020 9:22:26 AM This  report has been signed electronically. Number of Addenda: 0

## 2020-11-17 NOTE — Interval H&P Note (Signed)
History and Physical Interval Note:  11/17/2020 8:37 AM David Pham is a 70 y.o. male with PM CLL, prostate cancer s/p surgery, and whitecoat hypertension, who presents for evaluation of bloating and colorectal cancer screening.  States he is still having mild bloating of his abdomen but this is better compared to prior.  Denies any nausea, vomiting, fever or chills.  No melena or hematochezia.  Last Colonoscopy:2012 - hemorrhoids  BP 131/90   Pulse 68   Temp 98.5 F (36.9 C) (Oral)   Resp 14   Ht 5\' 9"  (1.753 m)   Wt 74.4 kg   SpO2 99%   BMI 24.22 kg/m  GENERAL: The patient is AO x3, in no acute distress. HEENT: Head is normocephalic and atraumatic. EOMI are intact. Mouth is well hydrated and without lesions. NECK: Supple. No masses LUNGS: Clear to auscultation. No presence of rhonchi/wheezing/rales. Adequate chest expansion HEART: RRR, normal s1 and s2. ABDOMEN: Soft, nontender, no guarding, no peritoneal signs, and nondistended. BS +. No masses. EXTREMITIES: Without any cyanosis, clubbing, rash, lesions or edema. NEUROLOGIC: AOx3, no focal motor deficit. SKIN: no jaundice, no rashes  CORRELL DENBOW  has presented today for surgery, with the diagnosis of bloating, screening.  The various methods of treatment have been discussed with the patient and family. After consideration of risks, benefits and other options for treatment, the patient has consented to  Procedure(s) with comments: COLONOSCOPY WITH PROPOFOL (N/A) - 830 ESOPHAGOGASTRODUODENOSCOPY (EGD) WITH PROPOFOL (N/A) as a surgical intervention.  The patient's history has been reviewed, patient examined, no change in status, stable for surgery.  I have reviewed the patient's chart and labs.  Questions were answered to the patient's satisfaction.     [name] [place]

## 2020-11-17 NOTE — Discharge Instructions (Signed)
You are being discharged to home.  Resume your previous diet.  We are waiting for your pathology results.  Continue omeprazole 20 mg qday. Your physician has recommended a repeat colonoscopy in 10 years for screening purposes.    Upper Endoscopy, Adult, Care After This sheet gives you information about how to care for yourself after your procedure. Your health care provider may also give you more specific instructions. If you have problems or questions, contact your health care provider. What can I expect after the procedure? After the procedure, it is common to have:  A sore throat.  Mild stomach pain or discomfort.  Bloating.  Nausea. Follow these instructions at home:  Follow instructions from your health care provider about what to eat or drink after your procedure.  Return to your normal activities as told by your health care provider. Ask your health care provider what activities are safe for you.  Take over-the-counter and prescription medicines only as told by your health care provider.  If you were given a sedative during the procedure, it can affect you for several hours. Do not drive or operate machinery until your health care provider says that it is safe.  Keep all follow-up visits as told by your health care provider. This is important.   Contact a health care provider if you have:  A sore throat that lasts longer than one day.  Trouble swallowing. Get help right away if:  You vomit blood or your vomit looks like coffee grounds.  You have: ? A fever. ? Bloody, black, or tarry stools. ? A severe sore throat or you cannot swallow. ? Difficulty breathing. ? Severe pain in your chest or abdomen. Summary  After the procedure, it is common to have a sore throat, mild stomach discomfort, bloating, and nausea.  If you were given a sedative during the procedure, it can affect you for several hours. Do not drive or operate machinery until your health care provider  says that it is safe.  Follow instructions from your health care provider about what to eat or drink after your procedure.  Return to your normal activities as told by your health care provider. This information is not intended to replace advice given to you by your health care provider. Make sure you discuss any questions you have with your health care provider. Document Revised: 07/08/2019 Document Reviewed: 12/10/2017 Elsevier Patient Education  2021 [place].   Colonoscopy, Adult, Care After This sheet gives you information about how to care for yourself after your procedure. Your doctor may also give you more specific instructions. If you have problems or questions, call your doctor. What can I expect after the procedure? After the procedure, it is common to have:  A small amount of blood in your poop (stool) for 24 hours.  Some gas.  Mild cramping or bloating in your belly (abdomen). Follow these instructions at home: Eating and drinking  Drink enough fluid to keep your pee (urine) pale yellow.  Follow instructions from your doctor about what you cannot eat or drink.  Return to your normal diet as told by your doctor. Avoid heavy or fried foods that are hard to digest.   Activity  Rest as told by your doctor.  Do not sit for a long time without moving. Get up to take short walks every 1-2 hours. This is important. Ask for help if you feel weak or unsteady.  Return to your normal activities as told by your doctor. Ask your  doctor what activities are safe for you. To help cramping and bloating:  Try walking around.  Put heat on your belly as told by your doctor. Use the heat source that your doctor recommends, such as a moist heat pack or a heating pad. ? Put a towel between your skin and the heat source. ? Leave the heat on for 20-30 minutes. ? Remove the heat if your skin turns bright red. This is very important if you are unable to feel pain, heat, or cold. You  may have a greater risk of getting burned.   General instructions  If you were given a medicine to help you relax (sedative) during your procedure, it can affect you for many hours. Do not drive or use machinery until your doctor says that it is safe.  For the first 24 hours after the procedure: ? Do not sign important documents. ? Do not drink alcohol. ? Do your daily activities more slowly than normal. ? Eat foods that are soft and easy to digest.  Take over-the-counter or prescription medicines only as told by your doctor.  Keep all follow-up visits as told by your doctor. This is important. Contact a doctor if:  You have blood in your poop 2-3 days after the procedure. Get help right away if:  You have more than a small amount of blood in your poop.  You see large clumps of tissue (blood clots) in your poop.  Your belly is swollen.  You feel like you may vomit (nauseous).  You vomit.  You have a fever.  You have belly pain that gets worse, and medicine does not help your pain. Summary  After the procedure, it is common to have a small amount of blood in your poop. You may also have mild cramping and bloating in your belly.  If you were given a medicine to help you relax (sedative) during your procedure, it can affect you for many hours. Do not drive or use machinery until your doctor says that it is safe.  Get help right away if you have a lot of blood in your poop, feel like you may vomit, have a fever, or have more belly pain. This information is not intended to replace advice given to you by your health care provider. Make sure you discuss any questions you have with your health care provider. Document Revised: 05/16/2019 Document Reviewed: 02/03/2019 Elsevier Patient Education  [place].

## 2020-11-17 NOTE — Anesthesia Postprocedure Evaluation (Signed)
Anesthesia Post Note  Patient: JACQUEZ SHEETZ  Procedure(s) Performed: COLONOSCOPY WITH PROPOFOL (N/A ) ESOPHAGOGASTRODUODENOSCOPY (EGD) WITH PROPOFOL (N/A ) BIOPSY  Patient location during evaluation: Phase II Anesthesia Type: General Level of consciousness: awake, sedated, awake and alert and patient cooperative Pain management: pain level controlled Vital Signs Assessment: post-procedure vital signs reviewed and stable Respiratory status: spontaneous breathing Cardiovascular status: stable Postop Assessment: no apparent nausea or vomiting Anesthetic complications: no   No complications documented.   Last Vitals:  Vitals:   11/17/20 0722  BP: 131/90  Pulse: 68  Resp: 14  Temp: 36.9 C  SpO2: 99%    Last Pain:  Vitals:   11/17/20 0840  TempSrc:   PainSc: 0-No pain                 Lavelle Akel

## 2020-11-18 LAB — SURGICAL PATHOLOGY

## 2020-11-19 ENCOUNTER — Encounter (HOSPITAL_COMMUNITY): Payer: Self-pay | Admitting: Gastroenterology

## 2021-01-28 ENCOUNTER — Other Ambulatory Visit: Payer: Self-pay

## 2021-01-28 ENCOUNTER — Ambulatory Visit (INDEPENDENT_AMBULATORY_CARE_PROVIDER_SITE_OTHER): Payer: Medicare Other | Admitting: Family Medicine

## 2021-01-28 VITALS — BP 118/68 | Temp 98.1°F | Ht 67.0 in | Wt 160.0 lb

## 2021-01-28 DIAGNOSIS — I10 Essential (primary) hypertension: Secondary | ICD-10-CM | POA: Diagnosis not present

## 2021-01-28 DIAGNOSIS — E785 Hyperlipidemia, unspecified: Secondary | ICD-10-CM | POA: Diagnosis not present

## 2021-01-28 DIAGNOSIS — Z9079 Acquired absence of other genital organ(s): Secondary | ICD-10-CM | POA: Diagnosis not present

## 2021-01-28 DIAGNOSIS — K1379 Other lesions of oral mucosa: Secondary | ICD-10-CM

## 2021-01-28 DIAGNOSIS — Z0000 Encounter for general adult medical examination without abnormal findings: Secondary | ICD-10-CM | POA: Diagnosis not present

## 2021-01-28 DIAGNOSIS — D72820 Lymphocytosis (symptomatic): Secondary | ICD-10-CM

## 2021-01-28 DIAGNOSIS — Z0001 Encounter for general adult medical examination with abnormal findings: Secondary | ICD-10-CM

## 2021-01-28 NOTE — Patient Instructions (Signed)

## 2021-01-28 NOTE — Progress Notes (Signed)
   Subjective:    Patient ID: David Pham, male    DOB: 1951-05-06, 70 y.o.   MRN: [id number]  HPI AWV- Annual Wellness Visit  The patient was seen for their annual wellness visit. The patient's past medical history, surgical history, and family history were reviewed. Pertinent vaccines were reviewed ( tetanus, pneumonia, shingles, flu) The patient's medication list was reviewed and updated.  The height and weight were entered.  BMI recorded in electronic record elsewhere  Cognitive screening was completed. Outcome of Mini - Cog: pass   Falls /depression screening electronically recorded within record elsewhere  Current tobacco usage:none (All patients who use tobacco were given written and verbal information on quitting)  Recent listing of emergency department/hospitalizations over the past year were reviewed.  current specialist the patient sees on a regular basis:    Medicare annual wellness visit patient questionnaire was reviewed.  A written screening schedule for the patient for the next 5-10 years was given. Appropriate discussion of followup regarding next visit was discussed.  Pt has appt over at [facility] for blood work next week and if provider orders labs they would like [place] to fax the orders over to Oncology.   Encounter for subsequent annual wellness visit (AWV) in Medicare patient - Plan: Lipid Profile, Basic Metabolic Panel (BMET), Hepatic function panel, Lactate Dehydrogenase (LDH), CBC with Differential  Primary hypertension - Plan: Lipid Profile, Basic Metabolic Panel (BMET), Hepatic function panel, Lactate Dehydrogenase (LDH), CBC with Differential  History of prostatectomy - Plan: Lipid Profile, Basic Metabolic Panel (BMET), Hepatic function panel, Lactate Dehydrogenase (LDH), CBC with Differential  Lymphocytosis - Plan: Lipid Profile, Basic Metabolic Panel (BMET), Hepatic function panel, Lactate Dehydrogenase (LDH), CBC with  Differential  Hyperlipidemia, unspecified hyperlipidemia type - Plan: Lipid Profile, Basic Metabolic Panel (BMET), Hepatic function panel, Lactate Dehydrogenase (LDH), CBC with Differential  Nodule of cheek - Plan: Lipid Profile, Basic Metabolic Panel (BMET), Hepatic function panel, Lactate Dehydrogenase (LDH), CBC with Differential Patient has a nodule on the left this will need to be referred to a specialist to have this removed more than likely head and neck specialists  Patient also has blood pressure issues taking his medicine as directed.  Doing well with just watching his diet Review of Systems     Objective:   Physical Exam General-in no acute distress Eyes-no discharge Lungs-respiratory rate normal, CTA CV-no murmurs,RRR Extremities skin warm dry no edema Neuro grossly normal Behavior normal, alert        Assessment & Plan:  We did discuss Shingrix I also gave him written instructions regarding this He is up-to-date on colonoscopy He will do some lab work papers were given His cognitive skills are doing well motor skills doing well not depressed Follows with hematology on a regular basis Will follow-up here in 5 to 6 months

## 2021-01-29 LAB — LIPID PANEL
Chol/HDL Ratio: 3.8 ratio (ref 0.0–5.0)
Cholesterol, Total: 176 mg/dL (ref 100–199)
HDL: 46 mg/dL (ref >39)
LDL Chol Calc (NIH): 107 mg/dL — ABNORMAL HIGH (ref 0–99)
Triglycerides: 128 mg/dL (ref 0–149)
VLDL Cholesterol Cal: 23 mg/dL (ref 5–40)

## 2021-01-29 LAB — BASIC METABOLIC PANEL
BUN/Creatinine Ratio: 13 (ref 10–24)
BUN: 11 mg/dL (ref 8–27)
CO2: 23 mmol/L (ref 20–29)
Calcium: 9.4 mg/dL (ref 8.6–10.2)
Chloride: 103 mmol/L (ref 96–106)
Creatinine, Ser: 0.86 mg/dL (ref 0.76–1.27)
Glucose: 85 mg/dL (ref 65–99)
Potassium: 4.4 mmol/L (ref 3.5–5.2)
Sodium: 141 mmol/L (ref 134–144)
eGFR: 93 mL/min/{1.73_m2} (ref >59)

## 2021-01-29 LAB — HEPATIC FUNCTION PANEL
ALT: 11 IU/L (ref 0–44)
AST: 16 IU/L (ref 0–40)
Albumin: 4.8 g/dL (ref 3.8–4.8)
Alkaline Phosphatase: 86 IU/L (ref 44–121)
Bilirubin Total: 0.9 mg/dL (ref 0.0–1.2)
Bilirubin, Direct: 0.22 mg/dL (ref 0.00–0.40)
Total Protein: 6.9 g/dL (ref 6.0–8.5)

## 2021-01-29 LAB — CBC WITH DIFFERENTIAL/PLATELET
Basophils Absolute: 0.1 10*3/uL (ref 0.0–0.2)
Basos: 0 %
EOS (ABSOLUTE): 0.1 10*3/uL (ref 0.0–0.4)
Eos: 0 %
Hematocrit: 43 % (ref 37.5–51.0)
Hemoglobin: 14.6 g/dL (ref 13.0–17.7)
Immature Grans (Abs): 0 10*3/uL (ref 0.0–0.1)
Immature Granulocytes: 0 %
Lymphocytes Absolute: 16.5 10*3/uL — ABNORMAL HIGH (ref 0.7–3.1)
Lymphs: 71 %
MCH: 30.3 pg (ref 26.6–33.0)
MCHC: 34 g/dL (ref 31.5–35.7)
MCV: 89 fL (ref 79–97)
Monocytes Absolute: 0.8 10*3/uL (ref 0.1–0.9)
Monocytes: 3 %
Neutrophils Absolute: 6.1 10*3/uL (ref 1.4–7.0)
Neutrophils: 26 %
Platelets: 218 10*3/uL (ref 150–450)
RBC: 4.82 x10E6/uL (ref 4.14–5.80)
RDW: 12.5 % (ref 11.6–15.4)
WBC: 23.7 10*3/uL (ref 3.4–10.8)

## 2021-01-29 LAB — LACTATE DEHYDROGENASE: LDH: 170 IU/L (ref 121–224)

## 2021-01-31 NOTE — Progress Notes (Signed)
Thank you, Dr. Wolfgang Phoenix.  We will address that at his upcoming appointment.  Appreciate the heads-up!

## 2021-02-07 NOTE — Progress Notes (Signed)
[place], [place] [address]   CLINIC:  Medical Oncology/Hematology  PCP:  Kathyrn Drown, MD [address] [place] [address] [phone]   REASON FOR VISIT:  Follow-up for CLL  PRIOR THERAPY: None  CURRENT THERAPY: Observation  INTERVAL HISTORY:  David Pham 70 y.o. male returns for routine follow-up of his chronic lymphocytic leukemia.  He was last seen by Tarri Abernethy PA-C on 10/25/2020.  At today's visit, he reports feeling well.  No recent hospitalizations, surgeries, or changes in baseline health status.  No new lumps or bumps. No fever, chills, night sweats, weight loss.  No extreme fatigue. No recent infections. Patient denies symptoms of hyperviscosity or leukostasis (dyspnea, visual changes, headaches, dizziness, confusion/somnolence, priapism) No early satiety or abdominal pain.   No shortness of breath, cough, chest pain, nausea, vomiting, abdominal pain.  Denies any signs or symptoms of blood loss.  No current signs or symptoms of blood clots.  He has 100% energy and 100% appetite. He endorses that he is maintaining a stable weight.    REVIEW OF SYSTEMS:  Review of Systems  Constitutional:  Negative for appetite change, chills, diaphoresis, fatigue, fever and unexpected weight change.  HENT:   Negative for lump/mass and nosebleeds.   Eyes:  Negative for eye problems.  Respiratory:  Negative for cough, hemoptysis and shortness of breath.   Cardiovascular:  Negative for chest pain, leg swelling and palpitations.  Gastrointestinal:  Negative for abdominal pain, blood in stool, constipation, diarrhea, nausea and vomiting.  Genitourinary:  Negative for hematuria.   Skin: Negative.   Neurological:  Negative for dizziness, headaches and light-headedness.  Hematological:  Does not bruise/bleed easily.  Psychiatric/Behavioral:  Positive for sleep disturbance.      PAST MEDICAL/SURGICAL HISTORY:  Past  Medical History:  Diagnosis Date   Cancer ([place]) 09/2009   prostate   Hemorrhoids    Incontinence of feces    Rectal pain    Toenail fungus 03/28/2020   I would not recommend oral medication due to liver toxicity potential, treating topically   White coat hypertension    Past Surgical History:  Procedure Laterality Date   BIOPSY  11/17/2020   Procedure: BIOPSY;  Surgeon: Harvel Quale, MD;  [facility];  Service: Gastroenterology;;   COLONOSCOPY  2006 and 2012   COLONOSCOPY  08/04/2010   COLONOSCOPY WITH PROPOFOL N/A 11/17/2020   Procedure: COLONOSCOPY WITH PROPOFOL;  Surgeon: Harvel Quale, MD;  [facility];  Service: Gastroenterology;  Laterality: N/A;  830   ESOPHAGOGASTRODUODENOSCOPY (EGD) WITH PROPOFOL N/A 11/17/2020   Procedure: ESOPHAGOGASTRODUODENOSCOPY (EGD) WITH PROPOFOL;  Surgeon: Harvel Quale, MD;  [facility];  Service: Gastroenterology;  Laterality: N/A;   PROSTATECTOMY     2011     SOCIAL HISTORY:  Social History   Socioeconomic History   Marital status: Single    Spouse name: Not on file   Number of children: Not on file   Years of education: Not on file   Highest education level: Not on file  Occupational History   Not on file  Tobacco Use   Smoking status: Never   Smokeless tobacco: Current    Types: Snuff  Vaping Use   Vaping Use: Never used  Substance and Sexual Activity   Alcohol use: Never   Drug use: Never   Sexual activity: Not on file  Other Topics Concern   Not on file  Social  History Narrative   Not on file   Social Determinants of Health   Financial Resource Strain: Not on file  Food Insecurity: Not on file  Transportation Needs: No Transportation Needs   Lack of Transportation (Medical): No   Lack of Transportation (Non-Medical): No  Physical Activity: Inactive   Days of Exercise per Week: 0 days   Minutes of Exercise per Session: 0 min  Stress: Not on file   Social Connections: Not on file  Intimate Partner Violence: Not At Risk   Fear of Current or Ex-Partner: No   Emotionally Abused: No   Physically Abused: No   Sexually Abused: No    FAMILY HISTORY:  Family History  Problem Relation Age of Onset   Cancer Sister        breast   Hypertension Brother    Diabetes Brother    Emphysema Mother    Asthma Father    Allergic rhinitis Father    Angioedema Neg Hx    Atopy Neg Hx    Eczema Neg Hx    Immunodeficiency Neg Hx    Urticaria Neg Hx     CURRENT MEDICATIONS:  Outpatient Encounter Medications as of 02/08/2021  Medication Sig   acetaminophen (TYLENOL) 500 MG tablet Take 1,000 mg by mouth at bedtime as needed (pain).   amLODipine (NORVASC) 2.5 MG tablet Take 1 tablet (2.5 mg total) by mouth daily. (Patient taking differently: Take 2.5 mg by mouth in the morning.)   Artificial Tear Solution (SOOTHE XP OP) Place 1-2 drops into both eyes 3 (three) times daily as needed (dry/irritated eyes.).   Camphor-Menthol-Methyl Sal (SALONPAS) 3.07-29-08 % PTCH Place 1 patch onto the skin daily as needed (back pain).   Cholecalciferol (VITAMIN D3) 50 MCG (2000 UT) TABS Take 2,000 Units by mouth in the morning.   fexofenadine (ALLEGRA) 180 MG tablet Take 180 mg by mouth in the morning.   fluticasone (FLONASE) 50 MCG/ACT nasal spray SPRAY 2 SPRAYS INTO EACH NOSTRIL EVERY DAY (Patient taking differently: Place 2 sprays into both nostrils daily as needed for allergies.)   omeprazole (PRILOSEC) 20 MG capsule Take 1 capsule (20 mg total) by mouth daily. (Patient taking differently: Take 20 mg by mouth in the morning.)   Polyethyl Glycol-Propyl Glycol (SYSTANE) 0.4-0.3 % SOLN Place 1-2 drops into both eyes 3 (three) times daily as needed (dry/irritated eyes.).   Polyvinyl Alcohol-Povidone (REFRESH OP) Place 1-2 drops into both eyes 3 (three) times daily as needed (dry/irritated eyes.).   vitamin B-12 (CYANOCOBALAMIN) 1000 MCG tablet Take 1,000 mcg by mouth in  the morning.   No facility-administered encounter medications on file as of 02/08/2021.    ALLERGIES:  Allergies  Allergen Reactions   Penicillins Other (See Comments)    Unknown reaction (possible rash)   Codeine Palpitations   Morphine And Related Palpitations     PHYSICAL EXAM:  ECOG PERFORMANCE STATUS: 0 - Asymptomatic  There were no vitals filed for this visit. There were no vitals filed for this visit. Physical Exam Constitutional:      Appearance: Normal appearance.  HENT:     Head: Normocephalic and atraumatic.     Mouth/Throat:     Mouth: Mucous membranes are moist.  Eyes:     Extraocular Movements: Extraocular movements intact.     Pupils: Pupils are equal, round, and reactive to light.  Cardiovascular:     Rate and Rhythm: Normal rate and regular rhythm.     Pulses: Normal pulses.  Heart sounds: Normal heart sounds.  Pulmonary:     Effort: Pulmonary effort is normal.     Breath sounds: Normal breath sounds.  Chest:  Breasts:    Right: No axillary adenopathy or supraclavicular adenopathy.     Left: No axillary adenopathy or supraclavicular adenopathy.  Abdominal:     General: Bowel sounds are normal.     Palpations: Abdomen is soft. There is no splenomegaly.     Tenderness: There is no abdominal tenderness.  Musculoskeletal:        General: No swelling.     Right lower leg: No edema.     Left lower leg: No edema.  Lymphadenopathy:     Head:     Right side of head: No submental, submandibular, tonsillar, preauricular, posterior auricular or occipital adenopathy.     Left side of head: No submental, submandibular, tonsillar, preauricular, posterior auricular or occipital adenopathy.     Cervical: No cervical adenopathy.     Right cervical: No superficial, deep or posterior cervical adenopathy.    Left cervical: No superficial, deep or posterior cervical adenopathy.     Upper Body:     Right upper body: No supraclavicular or axillary adenopathy.      Left upper body: No supraclavicular or axillary adenopathy.     Lower Body: No right inguinal adenopathy. No left inguinal adenopathy.  Skin:    General: Skin is warm and dry.  Neurological:     General: No focal deficit present.     Mental Status: He is alert and oriented to person, place, and time.  Psychiatric:        Mood and Affect: Mood normal.        Behavior: Behavior normal.     LABORATORY DATA:  I have reviewed the labs as listed.  CBC    Component Value Date/Time   WBC 23.7 (HH) 01/28/2021 1204   WBC 16.7 (H) 10/11/2020 1040   RBC 4.82 01/28/2021 1204   RBC 4.78 10/11/2020 1040   HGB 14.6 01/28/2021 1204   HCT 43.0 01/28/2021 1204   PLT 218 01/28/2021 1204   MCV 89 01/28/2021 1204   MCH 30.3 01/28/2021 1204   MCH 30.1 10/11/2020 1040   MCHC 34.0 01/28/2021 1204   MCHC 32.6 10/11/2020 1040   RDW 12.5 01/28/2021 1204   LYMPHSABS 16.5 (H) 01/28/2021 1204   MONOABS 0.5 10/11/2020 1040   EOSABS 0.1 01/28/2021 1204   BASOSABS 0.1 01/28/2021 1204   CMP Latest Ref Rng & Units 01/28/2021 08/02/2020 01/09/2020  Glucose 65 - 99 mg/dL 85 101(H) 95  BUN 8 - 27 mg/dL 11 9 9   Creatinine 0.76 - 1.27 mg/dL 0.86 0.99 0.85  Sodium 134 - 144 mmol/L 141 142 140  Potassium 3.5 - 5.2 mmol/L 4.4 3.7 4.0  Chloride 96 - 106 mmol/L 103 105 105  CO2 20 - 29 mmol/L 23 24 23   Calcium 8.6 - 10.2 mg/dL 9.4 9.2 9.3  Total Protein 6.0 - 8.5 g/dL 6.9 6.6 6.9  Total Bilirubin 0.0 - 1.2 mg/dL 0.9 0.6 1.1  Alkaline Phos 44 - 121 IU/L 86 80 79  AST 0 - 40 IU/L 16 17 18   ALT 0 - 44 IU/L 11 12 13     DIAGNOSTIC IMAGING:  I have independently reviewed the relevant imaging and discussed with the patient.  ASSESSMENT & PLAN: 1.  Chronic lymphocytic leukemia (Rai stage 0 / Binet stage A) -Lymphocytosis first noted by PCP in 2019 -CLL officially diagnosed in March  2022, per the following labs obtained 10/11/2020: Pathologist smear review: Absolute lymphocytosis, smudge cells on differential Flow  cytology (via surgical pathology): Monoclonal B-cell population identified consistent with chronic lymphocytic leukemia, with phenotype of abnormal cells being CD5, CD19, CD20, CD 200, lambda -Rai stage 0 (lymphocytosis alone, no lymphadenopathy or palpable hepatosplenomegaly, no anemia, no thrombocytopenia) - low risk, average overall survival greater than 150 months -Binet stage A (OS same as age-matched controls) - Denies unintentional weight loss, severe fatigue, ongoing fever/night sweats, or other B symptoms - No anemia or thrombocytopenia to date - No palpable lymphadenopathy, splenomegaly, or hepatomegaly on exam - Abdominal ultrasound obtained 09/07/2020 was negative for splenomegaly -Most recent labs (01/28/2021) show increased WBC 23.7 with increased absolute lymphocytes 16.5; LDH normal at 170 - Absolute lymphocytes 16.5 (01/28/2021) shows a 30% increase compared to absolute lymphocytes 12.7 (10/11/2020) - Patient denies symptoms of hyperviscosity or leukostasis (dyspnea, visual changes, headaches, dizziness, confusion/somnolence, priapism) - PLAN: No indication for treatment at this time.  Follow closely with repeat labs and MD visit in 3 months  2.  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 The patient knows to call the clinic with any problems, questions or concerns.  Medical decision making: Low  Time spent  on visit: I spent 20 minutes counseling the patient face to face. The total time spent in the appointment was 30 minutes and more than 50% was on counseling.   Harriett Rush, PA-C  02/08/2021 11:51 AM

## 2021-02-08 ENCOUNTER — Inpatient Hospital Stay (HOSPITAL_COMMUNITY): Payer: Medicare Other | Attending: Physician Assistant | Admitting: Physician Assistant

## 2021-02-08 ENCOUNTER — Other Ambulatory Visit: Payer: Self-pay

## 2021-02-08 VITALS — BP 140/81 | HR 63 | Temp 96.8°F | Resp 18 | Wt 161.6 lb

## 2021-02-08 DIAGNOSIS — C9110 Chronic lymphocytic leukemia of B-cell type not having achieved remission: Secondary | ICD-10-CM | POA: Diagnosis not present

## 2021-02-08 NOTE — Patient Instructions (Addendum)
[place] at [facility] Discharge Instructions  You were seen today by Tarri Abernethy PA-C for your CLL (chronic lymphocytic leukemia).  Although your white blood cells and lymphocytes are increased at this visit, you do not need treatment at this time.  We will continue to watch your blood counts and check you for symptoms of your CLL at your follow-up visit.  LABS: Return in 3 months for repeat labs  OTHER TESTS: None  MEDICATIONS: No changes to home medications  FOLLOW-UP APPOINTMENT: Office visit in 3 months   Thank you for choosing [place] at [facility] to provide your oncology and hematology care.  To afford each patient quality time with our provider, please arrive at least 15 minutes before your scheduled appointment time.   If you have a lab appointment with the [facility] please come in thru the Main Entrance and check in at the main information desk.  You need to re-schedule your appointment should you arrive 10 or more minutes late.  We strive to give you quality time with our providers, and arriving late affects you and other patients whose appointments are after yours.  Also, if you no show three or more times for appointments you may be dismissed from the clinic at the providers discretion.     Again, thank you for choosing [facility].  Our hope is that these requests will decrease the amount of time that you wait before being seen by our physicians.       _____________________________________________________________  Should you have questions after your visit to [facility], please contact our office at [phone] and follow the prompts.  Our office hours are 8:00 a.m. and 4:30 p.m. Monday - Friday.  Please note that voicemails left after 4:00 p.m. may not be returned until the following business day.  We are closed weekends and major holidays.  You do have access to a nurse 24-7, just call  the main number to the clinic [phone] and do not press any options, hold on the line and a nurse will answer the phone.    For prescription refill requests, have your pharmacy contact our office and allow 72 hours.    Due to Covid, you will need to wear a mask upon entering the hospital. If you do not have a mask, a mask will be given to you at the Main Entrance upon arrival. For doctor visits, patients may have 1 support person age 67 or older with them. For treatment visits, patients can not have anyone with them due to social distancing guidelines and our immunocompromised population.

## 2021-02-14 ENCOUNTER — Other Ambulatory Visit: Payer: Self-pay | Admitting: Orthopaedic Surgery

## 2021-02-14 DIAGNOSIS — G8929 Other chronic pain: Secondary | ICD-10-CM

## 2021-02-14 DIAGNOSIS — M25512 Pain in left shoulder: Secondary | ICD-10-CM

## 2021-03-27 ENCOUNTER — Other Ambulatory Visit: Payer: Self-pay | Admitting: Family Medicine

## 2021-05-10 ENCOUNTER — Inpatient Hospital Stay (HOSPITAL_COMMUNITY): Payer: Medicare Other | Attending: Hematology

## 2021-05-10 ENCOUNTER — Other Ambulatory Visit: Payer: Self-pay

## 2021-05-10 DIAGNOSIS — C9110 Chronic lymphocytic leukemia of B-cell type not having achieved remission: Secondary | ICD-10-CM | POA: Insufficient documentation

## 2021-05-10 DIAGNOSIS — Z8546 Personal history of malignant neoplasm of prostate: Secondary | ICD-10-CM | POA: Diagnosis not present

## 2021-05-10 DIAGNOSIS — Z79899 Other long term (current) drug therapy: Secondary | ICD-10-CM | POA: Insufficient documentation

## 2021-05-10 DIAGNOSIS — Z9079 Acquired absence of other genital organ(s): Secondary | ICD-10-CM | POA: Diagnosis not present

## 2021-05-10 LAB — COMPREHENSIVE METABOLIC PANEL
ALT: 17 U/L (ref 0–44)
AST: 19 U/L (ref 15–41)
Albumin: 4.1 g/dL (ref 3.5–5.0)
Alkaline Phosphatase: 65 U/L (ref 38–126)
Anion gap: 5 (ref 5–15)
BUN: 11 mg/dL (ref 8–23)
CO2: 28 mmol/L (ref 22–32)
Calcium: 8.8 mg/dL — ABNORMAL LOW (ref 8.9–10.3)
Chloride: 107 mmol/L (ref 98–111)
Creatinine, Ser: 0.89 mg/dL (ref 0.61–1.24)
GFR, Estimated: >60 mL/min (ref >60)
Glucose, Bld: 88 mg/dL (ref 70–99)
Potassium: 4 mmol/L (ref 3.5–5.1)
Sodium: 140 mmol/L (ref 135–145)
Total Bilirubin: 0.8 mg/dL (ref 0.3–1.2)
Total Protein: 7.2 g/dL (ref 6.5–8.1)

## 2021-05-10 LAB — CBC WITH DIFFERENTIAL/PLATELET
Abs Immature Granulocytes: 0.04 10*3/uL (ref 0.00–0.07)
Basophils Absolute: 0.1 10*3/uL (ref 0.0–0.1)
Basophils Relative: 0 %
Eosinophils Absolute: 0.1 10*3/uL (ref 0.0–0.5)
Eosinophils Relative: 0 %
HCT: 43.7 % (ref 39.0–52.0)
Hemoglobin: 14.1 g/dL (ref 13.0–17.0)
Immature Granulocytes: 0 %
Lymphocytes Relative: 79 %
Lymphs Abs: 14.5 10*3/uL — ABNORMAL HIGH (ref 0.7–4.0)
MCH: 30.2 pg (ref 26.0–34.0)
MCHC: 32.3 g/dL (ref 30.0–36.0)
MCV: 93.6 fL (ref 80.0–100.0)
Monocytes Absolute: 0.8 10*3/uL (ref 0.1–1.0)
Monocytes Relative: 4 %
Neutro Abs: 3.2 10*3/uL (ref 1.7–7.7)
Neutrophils Relative %: 17 %
Platelets: 209 10*3/uL (ref 150–400)
RBC: 4.67 MIL/uL (ref 4.22–5.81)
RDW: 12.9 % (ref 11.5–15.5)
Smear Review: NORMAL
WBC: 18.6 10*3/uL — ABNORMAL HIGH (ref 4.0–10.5)
nRBC: 0 % (ref 0.0–0.2)

## 2021-05-10 LAB — LACTATE DEHYDROGENASE: LDH: 131 U/L (ref 98–192)

## 2021-05-16 NOTE — Progress Notes (Signed)
[place], [place] [address]   CLINIC:  Medical Oncology/Hematology  PCP:  Kathyrn Drown, MD [address] [place] / [place] [address] [phone]   REASON FOR VISIT:  Follow-up for CLL  PRIOR THERAPY: none  CURRENT THERAPY: surveillance  BRIEF ONCOLOGIC HISTORY:  Oncology History   No history exists.    CANCER STAGING: Cancer Staging No matching staging information was found for the patient.  INTERVAL HISTORY:  David Pham, a 70 y.o. male, returns for routine follow-up of his CLL. David Pham was last seen on 02/08/2021.   Today he reports feeling good. He denies fatigue, fevers, night sweats, lumps/masses, and weight loss. He is still currently working and reports good energy and activity levels.    REVIEW OF SYSTEMS:  Review of Systems  Constitutional:  Negative for appetite change, fatigue, fever and unexpected weight change.  HENT:   Negative for lump/mass.   All other systems reviewed and are negative.  PAST MEDICAL/SURGICAL HISTORY:  Past Medical History:  Diagnosis Date   Cancer ([place]) 09/2009   prostate   Hemorrhoids    Incontinence of feces    Rectal pain    Toenail fungus 03/28/2020   I would not recommend oral medication due to liver toxicity potential, treating topically   White coat hypertension    Past Surgical History:  Procedure Laterality Date   BIOPSY  11/17/2020   Procedure: BIOPSY;  Surgeon: Harvel Quale, MD;  [facility];  Service: Gastroenterology;;   COLONOSCOPY  2006 and 2012   COLONOSCOPY  08/04/2010   COLONOSCOPY WITH PROPOFOL N/A 11/17/2020   Procedure: COLONOSCOPY WITH PROPOFOL;  Surgeon: Harvel Quale, MD;  [facility];  Service: Gastroenterology;  Laterality: N/A;  830   ESOPHAGOGASTRODUODENOSCOPY (EGD) WITH PROPOFOL N/A 11/17/2020   Procedure: ESOPHAGOGASTRODUODENOSCOPY (EGD) WITH PROPOFOL;  Surgeon: Harvel Quale, MD;   [facility];  Service: Gastroenterology;  Laterality: N/A;   PROSTATECTOMY     2011    SOCIAL HISTORY:  Social History   Socioeconomic History   Marital status: Single    Spouse name: Not on file   Number of children: Not on file   Years of education: Not on file   Highest education level: Not on file  Occupational History   Not on file  Tobacco Use   Smoking status: Never   Smokeless tobacco: Current    Types: Snuff  Vaping Use   Vaping Use: Never used  Substance and Sexual Activity   Alcohol use: Never   Drug use: Never   Sexual activity: Not on file  Other Topics Concern   Not on file  Social History Narrative   Not on file   Social Determinants of Health   Financial Resource Strain: Not on file  Food Insecurity: Not on file  Transportation Needs: No Transportation Needs   Lack of Transportation (Medical): No   Lack of Transportation (Non-Medical): No  Physical Activity: Inactive   Days of Exercise per Week: 0 days   Minutes of Exercise per Session: 0 min  Stress: Not on file  Social Connections: Not on file  Intimate Partner Violence: Not At Risk   Fear of Current or Ex-Partner: No   Emotionally Abused: No   Physically Abused: No   Sexually Abused: No    FAMILY HISTORY:  Family History  Problem Relation Age of Onset   Cancer Sister  breast   Hypertension Brother    Diabetes Brother    Emphysema Mother    Asthma Father    Allergic rhinitis Father    Angioedema Neg Hx    Atopy Neg Hx    Eczema Neg Hx    Immunodeficiency Neg Hx    Urticaria Neg Hx     CURRENT MEDICATIONS:  Current Outpatient Medications  Medication Sig Dispense Refill   acetaminophen (TYLENOL) 500 MG tablet Take 1,000 mg by mouth at bedtime as needed (pain).     amLODipine (NORVASC) 2.5 MG tablet TAKE 1 TABLET BY MOUTH EVERY DAY 90 tablet 0   Artificial Tear Solution (SOOTHE XP OP) Place 1-2 drops into both eyes 3 (three) times daily as needed (dry/irritated  eyes.).     Camphor-Menthol-Methyl Sal (SALONPAS) 3.07-29-08 % PTCH Place 1 patch onto the skin daily as needed (back pain). (Patient not taking: Reported on 02/08/2021)     Cholecalciferol (VITAMIN D3) 50 MCG (2000 UT) TABS Take 2,000 Units by mouth in the morning.     fexofenadine (ALLEGRA) 180 MG tablet Take 180 mg by mouth in the morning.     fluticasone (FLONASE) 50 MCG/ACT nasal spray SPRAY 2 SPRAYS INTO EACH NOSTRIL EVERY DAY (Patient taking differently: Place 2 sprays into both nostrils daily as needed for allergies.) 48 mL 1   omeprazole (PRILOSEC) 20 MG capsule TAKE 1 CAPSULE BY MOUTH EVERY DAY 90 capsule 0   Polyethyl Glycol-Propyl Glycol (SYSTANE) 0.4-0.3 % SOLN Place 1-2 drops into both eyes 3 (three) times daily as needed (dry/irritated eyes.). (Patient not taking: Reported on 02/08/2021)     polyethylene glycol-electrolytes (NULYTELY) 420 g solution Take by mouth once.     Polyvinyl Alcohol-Povidone (REFRESH OP) Place 1-2 drops into both eyes 3 (three) times daily as needed (dry/irritated eyes.). (Patient not taking: Reported on 02/08/2021)     vitamin B-12 (CYANOCOBALAMIN) 1000 MCG tablet Take 1,000 mcg by mouth in the morning.     No current facility-administered medications for this visit.    ALLERGIES:  Allergies  Allergen Reactions   Penicillins Other (See Comments)    Unknown reaction (possible rash)   Codeine Palpitations   Morphine And Related Palpitations    PHYSICAL EXAM:  Performance status (ECOG): 0 - Asymptomatic  There were no vitals filed for this visit. Wt Readings from Last 3 Encounters:  02/08/21 161 lb 9.6 oz (73.3 kg)  01/28/21 160 lb (72.6 kg)  11/17/20 164 lb (74.4 kg)   Physical Exam Vitals reviewed.  Constitutional:      Appearance: Normal appearance.  Cardiovascular:     Rate and Rhythm: Normal rate and regular rhythm.     Pulses: Normal pulses.     Heart sounds: Normal heart sounds.  Pulmonary:     Effort: Pulmonary effort is normal.      Breath sounds: Normal breath sounds.  Abdominal:     Palpations: Abdomen is soft. There is no hepatomegaly, splenomegaly or mass.     Tenderness: There is no abdominal tenderness.  Lymphadenopathy:     Cervical: No cervical adenopathy.     Right cervical: No superficial or deep cervical adenopathy.    Left cervical: No superficial or deep cervical adenopathy.     Upper Body:     Right upper body: No supraclavicular, axillary or pectoral adenopathy.     Left upper body: No supraclavicular, axillary or pectoral adenopathy.     Lower Body: No right inguinal adenopathy. No left inguinal adenopathy.  Neurological:  General: No focal deficit present.     Mental Status: He is alert and oriented to person, place, and time.  Psychiatric:        Mood and Affect: Mood normal.        Behavior: Behavior normal.     LABORATORY DATA:  I have reviewed the labs as listed.  CBC Latest Ref Rng & Units 05/10/2021 01/28/2021 10/11/2020  WBC 4.0 - 10.5 K/uL 18.6(H) 23.7(HH) 16.7(H)  Hemoglobin 13.0 - 17.0 g/dL 14.1 14.6 14.4  Hematocrit 39.0 - 52.0 % 43.7 43.0 44.2  Platelets 150 - 400 K/uL 209 218 221   CMP Latest Ref Rng & Units 05/10/2021 01/28/2021 08/02/2020  Glucose 70 - 99 mg/dL 88 85 101(H)  BUN 8 - 23 mg/dL 11 11 9   Creatinine 0.61 - 1.24 mg/dL 0.89 0.86 0.99  Sodium 135 - 145 mmol/L 140 141 142  Potassium 3.5 - 5.1 mmol/L 4.0 4.4 3.7  Chloride 98 - 111 mmol/L 107 103 105  CO2 22 - 32 mmol/L 28 23 24   Calcium 8.9 - 10.3 mg/dL 8.8(L) 9.4 9.2  Total Protein 6.5 - 8.1 g/dL 7.2 6.9 6.6  Total Bilirubin 0.3 - 1.2 mg/dL 0.8 0.9 0.6  Alkaline Phos 38 - 126 U/L 65 86 80  AST 15 - 41 U/L 19 16 17   ALT 0 - 44 U/L 17 11 12     DIAGNOSTIC IMAGING:  I have independently reviewed the scans and discussed with the patient. No results found.   ASSESSMENT:  1.  Stage 0 CLL: -Lymphocyte predominant leukocytosis since 2019 -Leukocytosis first noted 11/17/2017 with total WBC 11.9 and absolute lymphocytes  5.9 -Most recent labs reviewed from 10/01/2020, WBC 16.5 with 74% lymphocytes, absolute lymphocytes 12.1. -No anemia or thrombocytopenia to date -No palpable lymphadenopathy, splenomegaly, hepatomegaly on exam -Abdominal ultrasound obtained 09/07/2020 was negative for splenomegaly - Flow cytometry on 10/11/2020 with monoclonal B-cell population, CD5 positive, CD19, CD20 positive consistent with CLL.   2.  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  Stage 0 CLL: -He does not have any B symptoms.  No recurrent infections. - Reviewed labs from 05/10/2021.  White count is 18.6 and predominantly lymphocytes.  Hemoglobin and platelets are normal.  LFTs are normal.  LDH was 131. - Physical examination did not reveal any palpable adenopathy or splenomegaly. - We will continue 6 monthly monitoring.  He will call us if he develops any B symptoms.   Orders placed this encounter:  No orders of the defined types were placed in this encounter.    Derek Jack, MD [facility] [phone]   I, Thana Ates, am acting as a scribe for Dr. Derek Jack.  I, Derek Jack MD, have reviewed the above documentation for accuracy and completeness, and I agree with the above.

## 2021-05-17 ENCOUNTER — Inpatient Hospital Stay (HOSPITAL_COMMUNITY): Payer: Medicare Other | Admitting: Hematology

## 2021-05-17 ENCOUNTER — Other Ambulatory Visit: Payer: Self-pay

## 2021-05-17 DIAGNOSIS — Z79899 Other long term (current) drug therapy: Secondary | ICD-10-CM | POA: Diagnosis not present

## 2021-05-17 DIAGNOSIS — D72820 Lymphocytosis (symptomatic): Secondary | ICD-10-CM | POA: Diagnosis not present

## 2021-05-17 DIAGNOSIS — C9110 Chronic lymphocytic leukemia of B-cell type not having achieved remission: Secondary | ICD-10-CM | POA: Diagnosis not present

## 2021-05-17 NOTE — Patient Instructions (Signed)
[place] at [facility] Discharge Instructions  You were seen and examined today by Dr. Delton Coombes. He reviewed your most recent labs and everything looks improved. Please follow up as scheduled in 6 months.   Thank you for choosing [place] at [facility] to provide your oncology and hematology care.  To afford each patient quality time with our provider, please arrive at least 15 minutes before your scheduled appointment time.   If you have a lab appointment with the [facility] please come in thru the Main Entrance and check in at the main information desk.  You need to re-schedule your appointment should you arrive 10 or more minutes late.  We strive to give you quality time with our providers, and arriving late affects you and other patients whose appointments are after yours.  Also, if you no show three or more times for appointments you may be dismissed from the clinic at the providers discretion.     Again, thank you for choosing [facility].  Our hope is that these requests will decrease the amount of time that you wait before being seen by our physicians.       _____________________________________________________________  Should you have questions after your visit to [facility], please contact our office at [phone] and follow the prompts.  Our office hours are 8:00 a.m. and 4:30 p.m. Monday - Friday.  Please note that voicemails left after 4:00 p.m. may not be returned until the following business day.  We are closed weekends and major holidays.  You do have access to a nurse 24-7, just call the main number to the clinic [phone] and do not press any options, hold on the line and a nurse will answer the phone.    For prescription refill requests, have your pharmacy contact our office and allow 72 hours.    Due to Covid, you will need to wear a mask upon entering the hospital. If you do not have a  mask, a mask will be given to you at the Main Entrance upon arrival. For doctor visits, patients may have 1 support person age 70 or older with them. For treatment visits, patients can not have anyone with them due to social distancing guidelines and our immunocompromised population.

## 2021-05-31 ENCOUNTER — Encounter: Payer: Self-pay | Admitting: Family Medicine

## 2021-05-31 ENCOUNTER — Ambulatory Visit (HOSPITAL_COMMUNITY)
Admission: RE | Admit: 2021-05-31 | Discharge: 2021-05-31 | Disposition: A | Discharge status: Home / Self Care | Payer: Medicare Other | Source: Ambulatory Visit | Attending: Family Medicine | Admitting: Family Medicine

## 2021-05-31 ENCOUNTER — Other Ambulatory Visit: Payer: Self-pay

## 2021-05-31 ENCOUNTER — Ambulatory Visit (INDEPENDENT_AMBULATORY_CARE_PROVIDER_SITE_OTHER): Payer: Medicare Other | Admitting: Family Medicine

## 2021-05-31 VITALS — BP 138/80 | Temp 97.4°F | Wt 164.8 lb

## 2021-05-31 DIAGNOSIS — M25511 Pain in right shoulder: Secondary | ICD-10-CM | POA: Insufficient documentation

## 2021-05-31 DIAGNOSIS — M19011 Primary osteoarthritis, right shoulder: Secondary | ICD-10-CM | POA: Diagnosis not present

## 2021-05-31 MED ORDER — OMEPRAZOLE 20 MG PO CPDR: DELAYED_RELEASE_CAPSULE | ORAL | 2 refills | Status: DC

## 2021-05-31 MED ORDER — AMLODIPINE BESYLATE 2.5 MG PO TABS: 2.5000 mg | ORAL_TABLET | Freq: Every day | ORAL | 2 refills | Status: DC

## 2021-05-31 MED ORDER — FLUTICASONE PROPIONATE 50 MCG/ACT NA SUSP: 2.0000 | Freq: Every day | NASAL | 2 refills | Status: DC

## 2021-05-31 NOTE — Progress Notes (Signed)
    Subjective:    Patient ID: David Pham, male    DOB: 1951/01/05, 70 y.o.   MRN: [id number]  HPI Pt having right shoulder pain. Pt fell into wall after tripping over an item that was at door. Pt has since moved the item from the floor. Pt can move shoulder but still "bugs him". Pt states the night he hit shoulder he could not barely move. Bothers more in the evening and late at night when he is done with doing what he needs to. Pt taking extra strength tylenol.  Shoulder pain and discomfort after falling into a wall hurts with certain movements  Review of Systems     Objective:   Physical Exam Empty can test negative Impingement test negative Internal and external rotation normal Lag testing internal and external normal Some tenderness at the [facility] joint some tenderness with reaching across the body        Assessment & Plan:  X-rays ordered More than likely shoulder pain tendinitis Injection of steroid sterile technique after permission from the patient and timeout Range of motion exercises were shown No progressive symptoms referral to orthopedics Recheck in 3 to 4 weeks  Procedure-2 cc of lidocaine along with 40 mg of Depo-Medrol was injected into the shoulder without difficulty.  Anterior approach was used, entered joint without trouble, sterile technique  May use Voltaren gel

## 2021-06-02 LAB — FLOW CYTOMETRY

## 2021-06-14 ENCOUNTER — Ambulatory Visit: Payer: Medicare Other | Admitting: Family Medicine

## 2021-06-23 ENCOUNTER — Encounter: Payer: Self-pay | Admitting: Family Medicine

## 2021-06-23 ENCOUNTER — Ambulatory Visit (INDEPENDENT_AMBULATORY_CARE_PROVIDER_SITE_OTHER): Payer: Medicare Other | Admitting: Family Medicine

## 2021-06-23 ENCOUNTER — Other Ambulatory Visit: Payer: Self-pay

## 2021-06-23 ENCOUNTER — Other Ambulatory Visit: Payer: Self-pay | Admitting: Family Medicine

## 2021-06-23 VITALS — BP 126/86 | Temp 98.1°F | Wt 164.6 lb

## 2021-06-23 DIAGNOSIS — I10 Essential (primary) hypertension: Secondary | ICD-10-CM | POA: Diagnosis not present

## 2021-06-23 DIAGNOSIS — E785 Hyperlipidemia, unspecified: Secondary | ICD-10-CM | POA: Diagnosis not present

## 2021-06-23 DIAGNOSIS — M4317 Spondylolisthesis, lumbosacral region: Secondary | ICD-10-CM | POA: Diagnosis not present

## 2021-06-23 NOTE — Progress Notes (Signed)
   Subjective:    Patient ID: David Pham, male    DOB: 1951-05-02, 70 y.o.   MRN: [id number]  HPI Pt here for follow up on right shoulder pain. Pt states shoulder is not bothering him as much. Pt states in the mornings his shoulder is stiff. Pt able to move shoulder well.  He did receive the injection He is also doing range of motion exercises Not doing strengthening exercises currently   Pt states for about 2-3 days his legs have been weak-he relates he has some intermittent low back discomfort.  Does not radiate down the legs.  Has a previous diagnosis of spondylolisthesis States previous doctor told him they could do surgery for it it has not been debilitating recently He denies numbness or tingling in the legs he just states he do not seem as strong He does do yard work but he is not doing any type of walking on a regular basis no core exercise does do minimal strength exercises with Bowflex   Review of Systems     Objective:   Physical Exam Lungs are clear heart regular pulse normal BP is good shoulder good range of motion bilateral low back decent range of motion regular straight leg raise strength in legs good       Assessment & Plan:  1. Primary hypertension Blood pressure overall good control he is due for cholesterol profile check lab work.  Also check kidney function. - Lipid Profile - Basic Metabolic Panel (BMET)  2. Spondylolisthesis at L5-S1 level I did give him a packet of core exercises to do for the back and stretches.  Certainly if he gets worse referral back to the surgeon currently right now does not appear to need surgery  3. Hyperlipidemia, unspecified hyperlipidemia type Check lipid profile.  Healthy diet recommended - Lipid Profile - Basic Metabolic Panel (BMET)  Also shoulder is starting to get better gentle range of motion exercises.  Follow-up by fall time

## 2021-06-23 NOTE — Patient Instructions (Signed)

## 2021-06-28 ENCOUNTER — Ambulatory Visit
Admission: EM | Admit: 2021-06-28 | Discharge: 2021-06-28 | Disposition: A | Discharge status: Home / Self Care | Payer: Medicare Other | Attending: Physician Assistant | Admitting: Physician Assistant

## 2021-06-28 ENCOUNTER — Other Ambulatory Visit: Payer: Self-pay

## 2021-06-28 DIAGNOSIS — R059 Cough, unspecified: Secondary | ICD-10-CM | POA: Diagnosis not present

## 2021-06-28 DIAGNOSIS — J069 Acute upper respiratory infection, unspecified: Secondary | ICD-10-CM | POA: Diagnosis not present

## 2021-06-28 NOTE — Discharge Instructions (Signed)
Your influenza and Covid test are pending

## 2021-06-28 NOTE — ED Triage Notes (Addendum)
Patient presents to Urgent Care with complaints of fever, congestion, sore throat, and headache x 4 days. Treating symptoms with allergy medications and tylenol. He states he has a hx of sinus problems. Concerned with possible infection.

## 2021-06-29 LAB — COVID-19, FLU A+B NAA
Influenza A, NAA: NOT DETECTED
Influenza B, NAA: NOT DETECTED
SARS-CoV-2, NAA: DETECTED — AB

## 2021-06-30 ENCOUNTER — Telehealth: Payer: Self-pay | Admitting: Family Medicine

## 2021-06-30 NOTE — Telephone Encounter (Signed)
Symptoms started last Saturday and tested positive 06/28/21 No SOB -using OTC meds and told his lungs were clear when he was tested

## 2021-06-30 NOTE — Telephone Encounter (Signed)
Glad to hear that he is not short of breath and lungs are clear There are medications that can help lessen the risk of COVID getting worse and being in the hospital Patient has options If he desires to be seen today he can go to urgent care otherwise I could see him tomorrow at [address].

## 2021-06-30 NOTE — Telephone Encounter (Signed)
Patient scheduled office visit 07/01/21 at 11:30pm

## 2021-06-30 NOTE — Telephone Encounter (Signed)
Patient went to Urgent care but didn't stay and tested positive for Covid with Cough. Congestion,drainage,headache on 12/6 has been taking tylenol. Cough drops. Please advise [facility]

## 2021-07-01 ENCOUNTER — Other Ambulatory Visit: Payer: Self-pay

## 2021-07-01 ENCOUNTER — Ambulatory Visit (INDEPENDENT_AMBULATORY_CARE_PROVIDER_SITE_OTHER): Payer: Medicare Other | Admitting: Family Medicine

## 2021-07-01 DIAGNOSIS — U071 COVID-19: Secondary | ICD-10-CM | POA: Diagnosis not present

## 2021-07-01 NOTE — Progress Notes (Signed)
   Subjective:    Patient ID: David Pham, male    DOB: [birth year], 70 y.o.   MRN: [id number]  HPI Sinus congestion and drainage x , 6 days  No fever for past 2 days, taking otc tylenol, flonase, allergy medicine, halls cough drop  Patient presents today with respiratory illness Number of days present-6  Symptoms include-runny nose sore throat scratchy throat congestion coughing no wheezing or shortness of breath  Presence of worrisome signs (severe shortness of breath, lethargy, etc.) -none  Recent/current visit to urgent care or ER-went to urgent care was tested for COVID COVID-positive  Recent direct exposure to Covid-none that he knows of  Any current Covid testing-was positive   Review of Systems     Objective:   Physical Exam Gen-NAD not toxic TMS-normal bilateral T- normal no redness Chest-CTA respiratory rate normal no crackles CV RRR no murmur Skin-warm dry Neuro-grossly normal        Assessment & Plan:  I believe hear gradually get better warning signs discussed in detail  Covid infection This is a viral process.  Mild cases are treated with supportive measures at home such as Tylenol rest fluids.  In some situations monoclonal antibodies may be appropriate depending on the patient's risk criteria.  The patient was educated regarding progressive illness including respiratory, persistent vomiting, change in mental status.  If any of these occur ER evaluation is recommended. Patient was educated about the following as well Covid-19 respiratory warning: Covid-19 is a virus that causes hypoxia (low oxygen level in blood) in some people. If you develop any changes in your usual breathing pattern: difficulty catching your breath, more short winded with activity or with resting, or anything that concerns you about your breathing, do not hesitate to go to the emergency department immediately for evaluation. Please do not delay to get treatment.   Agrees with plan  of care discussed today. Understands warning signs to seek further care: Chest pain, shortness of breath, mental confusion, profuse vomiting, any significant change in health. Understands to follow-up if symptoms do not improve, or worsen.

## 2021-07-01 NOTE — Patient Instructions (Signed)
Hi David Pham you should gradually get better over the next 7 days.  This printed information has additional information to watch for.  Many individuals with COVID can have lingering congestion and intermittent coughing for several weeks.  If you feel it is getting worse please let us know.  You should try to stay away from others through Tuesday then after that you may be around others.  TakeCare-Dr. Nicki Reaper   COVID-19: What to Do if You Are Sick If you test positive and are an older adult or someone who is at high risk of getting very sick from COVID-19, treatment may be available. Contact a healthcare provider right away after a positive test to determine if you are eligible, even if your symptoms are mild right now. You can also visit a Test to Treat location and, if eligible, receive a prescription from a provider. Don't delay: Treatment must be started within the first few days to be effective. If you have a fever, cough, or other symptoms, you might have COVID-19. Most people have mild illness and are able to recover at home. If you are sick: Keep track of your symptoms. If you have an emergency warning sign (including trouble breathing), call 911. Steps to help prevent the spread of COVID-19 if you are sick If you are sick with COVID-19 or think you might have COVID-19, follow the steps below to care for yourself and to help protect other people in your home and community. Stay home except to get medical care Stay home. Most people with COVID-19 have mild illness and can recover at home without medical care. Do not leave your home, except to get medical care. Do not visit public areas and do not go to places where you are unable to wear a mask. Take care of yourself. Get rest and stay hydrated. Take over-the-counter medicines, such as acetaminophen, to help you feel better. Stay in touch with your doctor. Call before you get medical care. Be sure to get care if you have trouble breathing, or  have any other emergency warning signs, or if you think it is an emergency. Avoid public transportation, ride-sharing, or taxis if possible. Get tested If you have symptoms of COVID-19, get tested. While waiting for test results, stay away from others, including staying apart from those living in your household. Get tested as soon as possible after your symptoms start. Treatments may be available for people with COVID-19 who are at risk for becoming very sick. Don't delay: Treatment must be started early to be effective--some treatments must begin within 5 days of your first symptoms. Contact your healthcare provider right away if your test result is positive to determine if you are eligible. Self-tests are one of several options for testing for the virus that causes COVID-19 and may be more convenient than laboratory-based tests and point-of-care tests. Ask your healthcare provider or your local health department if you need help interpreting your test results. You can visit your state, tribal, local, and territorial health department's website to look for the latest local information on testing sites. Separate yourself from other people As much as possible, stay in a specific room and away from other people and pets in your home. If possible, you should use a separate bathroom. If you need to be around other people or animals in or outside of the home, wear a well-fitting mask. Tell your close contacts that they may have been exposed to COVID-19. An infected person can spread COVID-19 starting 48  hours (or 2 days) before the person has any symptoms or tests positive. By letting your close contacts know they may have been exposed to COVID-19, you are helping to protect everyone. See COVID-19 and Animals if you have questions about pets. If you are diagnosed with COVID-19, someone from the health department may call you. Answer the call to slow the spread. Monitor your symptoms Symptoms of COVID-19  include fever, cough, or other symptoms. Follow care instructions from your healthcare provider and local health department. Your local health authorities may give instructions on checking your symptoms and reporting information. When to seek emergency medical attention Look for emergency warning signs* for COVID-19. If someone is showing any of these signs, seek emergency medical care immediately: Trouble breathing Persistent pain or pressure in the chest New confusion Inability to wake or stay awake Pale, gray, or blue-colored skin, lips, or nail beds, depending on skin tone *This list is not all possible symptoms. Please call your medical provider for any other symptoms that are severe or concerning to you. Call 911 or call ahead to your local emergency facility: Notify the operator that you are seeking care for someone who has or may have COVID-19. Call ahead before visiting your doctor Call ahead. Many medical visits for routine care are being postponed or done by phone or telemedicine. If you have a medical appointment that cannot be postponed, call your doctor's office, and tell them you have or may have COVID-19. This will help the office protect themselves and other patients. If you are sick, wear a well-fitting mask You should wear a mask if you must be around other people or animals, including pets (even at home). Wear a mask with the best fit, protection, and comfort for you. You don't need to wear the mask if you are alone. If you can't put on a mask (because of trouble breathing, for example), cover your coughs and sneezes in some other way. Try to stay at least 6 feet away from other people. This will help protect the people around you. Masks should not be placed on young children under age 40 years, anyone who has trouble breathing, or anyone who is not able to remove the mask without help. Cover your coughs and sneezes Cover your mouth and nose with a tissue when you cough or  sneeze. Throw away used tissues in a lined trash can. Immediately wash your hands with soap and water for at least 20 seconds. If soap and water are not available, clean your hands with an alcohol-based hand sanitizer that contains at least 60% alcohol. Clean your hands often Wash your hands often with soap and water for at least 20 seconds. This is especially important after blowing your nose, coughing, or sneezing; going to the bathroom; and before eating or preparing food. Use hand sanitizer if soap and water are not available. Use an alcohol-based hand sanitizer with at least 60% alcohol, covering all surfaces of your hands and rubbing them together until they feel dry. Soap and water are the best option, especially if hands are visibly dirty. Avoid touching your eyes, nose, and mouth with unwashed hands. Handwashing Tips Avoid sharing personal household items Do not share dishes, drinking glasses, cups, eating utensils, towels, or bedding with other people in your home. Wash these items thoroughly after using them with soap and water or put in the dishwasher. Clean surfaces in your home regularly Clean and disinfect high-touch surfaces (for example, doorknobs, tables, handles, light switches, and countertops)  in your "sick room" and bathroom. In shared spaces, you should clean and disinfect surfaces and items after each use by the person who is ill. If you are sick and cannot clean, a caregiver or other person should only clean and disinfect the area around you (such as your bedroom and bathroom) on an as needed basis. Your caregiver/other person should wait as long as possible (at least several hours) and wear a mask before entering, cleaning, and disinfecting shared spaces that you use. Clean and disinfect areas that may have blood, stool, or body fluids on them. Use household cleaners and disinfectants. Clean visible dirty surfaces with household cleaners containing soap or detergent. Then,  use a household disinfectant. Use a product from [employer] List N: Disinfectants for Coronavirus (VQMGQ-67). Be sure to follow the instructions on the label to ensure safe and effective use of the product. Many products recommend keeping the surface wet with a disinfectant for a certain period of time (look at "contact time" on the product label). You may also need to wear personal protective equipment, such as gloves, depending on the directions on the product label. Immediately after disinfecting, wash your hands with soap and water for 20 seconds. For completed guidance on cleaning and disinfecting your home, visit Complete Disinfection Guidance. Take steps to improve ventilation at home Improve ventilation (air flow) at home to help prevent from spreading COVID-19 to other people in your household. Clear out COVID-19 virus particles in the air by opening windows, using air filters, and turning on fans in your home. Use this interactive tool to learn how to improve air flow in your home. When you can be around others after being sick with COVID-19 Deciding when you can be around others is different for different situations. Find out when you can safely end home isolation. For any additional questions about your care, contact your healthcare provider or state or local health department. 10/12/2020 Content source: [facility] for Immunization and Respiratory Diseases ([facility]), Division of Viral Diseases This information is not intended to replace advice given to you by your health care provider. Make sure you discuss any questions you have with your health care provider. Document Revised: 04/01/2021 Document Reviewed: 04/01/2021 Elsevier Patient Education  [employer].

## 2021-07-03 NOTE — ED Provider Notes (Signed)
[facility]    CSN: [id number] Arrival date & time: 06/28/21  1132      History   Chief Complaint Chief Complaint  Patient presents with   Fever   Sore Throat   Nasal Congestion   Chills    HPI David Pham is a 70 y.o. male.   The history is provided by the patient. No language interpreter was used.  Fever Temp source:  Subjective Severity:  Moderate Onset quality:  Gradual Timing:  Constant Progression:  Worsening Chronicity:  New Relieved by:  Nothing Worsened by:  Nothing Ineffective treatments:  None tried Associated symptoms: nausea and sore throat   Sore Throat   Past Medical History:  Diagnosis Date   Cancer ([place]) 09/2009   prostate   Hemorrhoids    Incontinence of feces    Rectal pain    Toenail fungus 03/28/2020   I would not recommend oral medication due to liver toxicity potential, treating topically   White coat hypertension     Patient Active Problem List   Diagnosis Date Noted   Hyperlipidemia 01/28/2021   Bloating 11/04/2020   Leukocytosis 10/10/2020   Fatty liver 10/01/2020   Toenail fungus 03/28/2020   Pain in left shoulder 02/18/2020   Seasonal and perennial allergic rhinitis 11/28/2019   Insect sting allergy 11/28/2019   Chronic throat clearing 09/29/2019   Mass of right knee 02/21/2017   HTN (hypertension) 11/06/2016   Left shoulder tendinitis 11/06/2016   Spondylolisthesis at L5-S1 level 09/23/2015   History of prostatectomy 05/11/2014   BACK PAIN 09/10/2008   ELEVATED PROSTATE SPECIFIC ANTIGEN 07/06/2008   COLONIC POLYPS 06/01/2008   TINNITUS, CHRONIC, BILATERAL 06/01/2008   Allergic rhinitis 06/01/2008   PNEUMONIA, LEFT 06/01/2008   DEGENERATIVE JOINT DISEASE 06/01/2008   ESOPHAGEAL REFLUX 11/29/2007   DYSPHAGIA UNSPECIFIED 10/28/2007    Past Surgical History:  Procedure Laterality Date   BIOPSY  11/17/2020   Procedure: BIOPSY;  Surgeon: Harvel Quale, MD;  [facility];   Service: Gastroenterology;;   COLONOSCOPY  2006 and 2012   COLONOSCOPY  08/04/2010   COLONOSCOPY WITH PROPOFOL N/A 11/17/2020   Procedure: COLONOSCOPY WITH PROPOFOL;  Surgeon: Harvel Quale, MD;  [facility];  Service: Gastroenterology;  Laterality: N/A;  830   ESOPHAGOGASTRODUODENOSCOPY (EGD) WITH PROPOFOL N/A 11/17/2020   Procedure: ESOPHAGOGASTRODUODENOSCOPY (EGD) WITH PROPOFOL;  Surgeon: Harvel Quale, MD;  [facility]: [facility];  Service: Gastroenterology;  Laterality: N/A;   PROSTATECTOMY     2011       Home Medications    Prior to Admission medications   Medication Sig Start Date End Date Taking? Authorizing Provider  acetaminophen (TYLENOL) 500 MG tablet Take 1,000 mg by mouth at bedtime as needed (pain).    [provider]  amLODipine (NORVASC) 2.5 MG tablet TAKE 1 TABLET BY MOUTH EVERY DAY 06/23/21   Kathyrn Drown, MD  Artificial Tear Solution (SOOTHE XP OP) Place 1-2 drops into both eyes 3 (three) times daily as needed (dry/irritated eyes.).    [provider]  Camphor-Menthol-Methyl Sal (SALONPAS) 3.07-29-08 % PTCH Place 1 patch onto the skin daily as needed (back pain).    [provider]  Cholecalciferol (VITAMIN D3) 50 MCG (2000 UT) TABS Take 2,000 Units by mouth in the morning.    [provider]  fexofenadine (ALLEGRA) 180 MG tablet Take 180 mg by mouth in the morning.    [provider]  fluticasone (FLONASE) 50 MCG/ACT nasal  spray SPRAY 2 SPRAYS INTO EACH NOSTRIL EVERY DAY Patient taking differently: Place 2 sprays into both nostrils daily as needed for allergies. 05/03/20   Kathyrn Drown, MD  fluticasone (FLONASE) 50 MCG/ACT nasal spray Place 2 sprays into both nostrils daily. 05/31/21   Kathyrn Drown, MD  omeprazole (PRILOSEC) 20 MG capsule TAKE 1 CAPSULE BY MOUTH EVERY DAY 06/23/21   Kathyrn Drown, MD  Polyethyl Glycol-Propyl Glycol (SYSTANE) 0.4-0.3 % SOLN Place 1-2 drops into both  eyes 3 (three) times daily as needed (dry/irritated eyes.).    [provider]  polyethylene glycol-electrolytes (NULYTELY) 420 g solution Take by mouth once. 11/04/20   [provider]  Polyvinyl Alcohol-Povidone (REFRESH OP) Place 1-2 drops into both eyes 3 (three) times daily as needed (dry/irritated eyes.).    [provider]  vitamin B-12 (CYANOCOBALAMIN) 1000 MCG tablet Take 1,000 mcg by mouth in the morning.    [provider]    Family History Family History  Problem Relation Age of Onset   Cancer Sister        breast   Hypertension Brother    Diabetes Brother    Emphysema Mother    Asthma Father    Allergic rhinitis Father    Angioedema Neg Hx    Atopy Neg Hx    Eczema Neg Hx    Immunodeficiency Neg Hx    Urticaria Neg Hx     Social History Social History   Tobacco Use   Smoking status: Never   Smokeless tobacco: Current    Types: Snuff  Vaping Use   Vaping Use: Never used  Substance Use Topics   Alcohol use: Never   Drug use: Never     Allergies   Penicillins, Codeine, and Morphine and related   Review of Systems Review of Systems  Constitutional:  Positive for fever.  HENT:  Positive for sore throat.   Gastrointestinal:  Positive for nausea.  All other systems reviewed and are negative.   Physical Exam Triage Vital Signs ED Triage Vitals  Enc Vitals Group     BP 06/28/21 1151 126/80     Pulse Rate 06/28/21 1151 88     Resp 06/28/21 1151 16     Temp 06/28/21 1151 98.3 F (36.8 C)     Temp Source 06/28/21 1151 Oral     SpO2 06/28/21 1151 96 %     Weight --      Height --      Head Circumference --      Peak Flow --      Pain Score 06/28/21 1147 0     Pain Loc --      Pain Edu? --      Excl. in [place]? --    No data found.  Updated Vital Signs BP 126/80 (BP Location: Right Arm)   Pulse 88   Temp 98.3 F (36.8 C) (Oral)   Resp 16   SpO2 96%   Visual Acuity Right Eye Distance:   Left Eye Distance:    Bilateral Distance:    Right Eye Near:   Left Eye Near:    Bilateral Near:     Physical Exam Vitals reviewed.  Constitutional:      Appearance: He is well-developed.  HENT:     Mouth/Throat:     Mouth: Mucous membranes are moist.  Cardiovascular:     Rate and Rhythm: Normal rate.  Musculoskeletal:     Cervical back: Normal range of motion.  Skin:    General: Skin is warm.  Neurological:     Mental Status: He is alert.  Psychiatric:        Mood and Affect: Mood normal.     UC Treatments / Results  Labs (all labs ordered are listed, but only abnormal results are displayed) Labs Reviewed  COVID-19, FLU A+B NAA - Abnormal; Notable for the following components:      Result Value   SARS-CoV-2, NAA Detected (*)    All other components within normal limits   Narrative:    Performed at:  [address], [place], [place]  [id number] Lab Director: Rush Farmer MD, Phone:  [phone]    EKG   Radiology No results found.  Procedures Procedures (including critical care time)  Medications Ordered in UC Medications - No data to display  Initial Impression / Assessment and Plan / UC Course  I have reviewed the triage vital signs and the nursing notes.  Pertinent labs & imaging results that were available during my care of the patient were reviewed by me and considered in my medical decision making (see chart for details).      Final Clinical Impressions(s) / UC Diagnoses   Final diagnoses:  Viral URI  Cough, unspecified type     Discharge Instructions      Your influenza and Covid test are pending   ED Prescriptions   None    PDMP not reviewed this encounter. An After Visit Summary was printed and given to the patient.    Fransico Meadow, [place] 07/03/21 2133

## 2021-07-13 DIAGNOSIS — J208 Acute bronchitis due to other specified organisms: Secondary | ICD-10-CM | POA: Diagnosis not present

## 2021-07-13 DIAGNOSIS — B9689 Other specified bacterial agents as the cause of diseases classified elsewhere: Secondary | ICD-10-CM | POA: Diagnosis not present

## 2021-07-26 DIAGNOSIS — I10 Essential (primary) hypertension: Secondary | ICD-10-CM | POA: Diagnosis not present

## 2021-07-26 DIAGNOSIS — E785 Hyperlipidemia, unspecified: Secondary | ICD-10-CM | POA: Diagnosis not present

## 2021-07-27 LAB — BASIC METABOLIC PANEL
BUN/Creatinine Ratio: 11 (ref 10–24)
BUN: 9 mg/dL (ref 8–27)
CO2: 25 mmol/L (ref 20–29)
Calcium: 9.1 mg/dL (ref 8.6–10.2)
Chloride: 105 mmol/L (ref 96–106)
Creatinine, Ser: 0.79 mg/dL (ref 0.76–1.27)
Glucose: 96 mg/dL (ref 70–99)
Potassium: 4.2 mmol/L (ref 3.5–5.2)
Sodium: 141 mmol/L (ref 134–144)
eGFR: 96 mL/min/{1.73_m2} (ref >59)

## 2021-07-27 LAB — LIPID PANEL
Chol/HDL Ratio: 3.9 ratio (ref 0.0–5.0)
Cholesterol, Total: 171 mg/dL (ref 100–199)
HDL: 44 mg/dL (ref >39)
LDL Chol Calc (NIH): 106 mg/dL — ABNORMAL HIGH (ref 0–99)
Triglycerides: 118 mg/dL (ref 0–149)
VLDL Cholesterol Cal: 21 mg/dL (ref 5–40)

## 2021-08-01 ENCOUNTER — Other Ambulatory Visit: Payer: Self-pay

## 2021-08-01 ENCOUNTER — Ambulatory Visit (INDEPENDENT_AMBULATORY_CARE_PROVIDER_SITE_OTHER): Payer: Medicare Other | Admitting: Family Medicine

## 2021-08-01 ENCOUNTER — Telehealth: Payer: Self-pay | Admitting: Family Medicine

## 2021-08-01 DIAGNOSIS — J209 Acute bronchitis, unspecified: Secondary | ICD-10-CM | POA: Diagnosis not present

## 2021-08-01 DIAGNOSIS — K219 Gastro-esophageal reflux disease without esophagitis: Secondary | ICD-10-CM

## 2021-08-01 NOTE — Telephone Encounter (Signed)
[name], donaho are scheduled for a virtual visit with your provider today.    Just as we do with appointments in the office, we must obtain your consent to participate.  Your consent will be active for this visit and any virtual visit you may have with one of our providers in the next 365 days.    If you have a MyChart account, I can also send a copy of this consent to you electronically.  All virtual visits are billed to your insurance company just like a traditional visit in the office.  As this is a virtual visit, video technology does not allow for your provider to perform a traditional examination.  This may limit your provider's ability to fully assess your condition.  If your provider identifies any concerns that need to be evaluated in person or the need to arrange testing such as labs, EKG, etc, we will make arrangements to do so.    Although advances in technology are sophisticated, we cannot ensure that it will always work on either your end or our end.  If the connection with a video visit is poor, we may have to switch to a telephone visit.  With either a video or telephone visit, we are not always able to ensure that we have a secure connection.   I need to obtain your verbal consent now.   Are you willing to proceed with your visit today?   David Pham has provided verbal consent on 08/01/2021 for a virtual visit (video or telephone).   David Males, LPN 09/22/7612  7:09 AM

## 2021-08-01 NOTE — Progress Notes (Signed)
° °  Subjective:    Patient ID: David Pham, male    DOB: Feb 11, 1951, 71 y.o.   MRN: [id number]  HPI Pt states he was diagnosed with bacterial infection about one week ago. Pt states he is feeling better but still having drainage. Pt states coughing does help clear his throat. Began to take Zyrtec. Pt states sometimes late at night he will get stopped up. Pt did relay that he was upset that he is unable to get into the office and see provider face to face.  Patient states he is having some head congestion drainage coughing denies wheezing or difficulty breathing in his lungs energy level overall doing okay denies high fever chills sweats Virtual Visit via Telephone Note  I connected with David Pham on 08/01/21 at  8:20 AM EST by telephone and verified that I am speaking with the correct person using two identifiers.  Location: Patient: home Provider: office   I discussed the limitations, risks, security and privacy concerns of performing an evaluation and management service by telephone and the availability of in person appointments. I also discussed with the patient that there may be a patient responsible charge related to this service. The patient expressed understanding and agreed to proceed.   History of Present Illness:    Observations/Objective:   Assessment and Plan:   Follow Up Instructions:    I discussed the assessment and treatment plan with the patient. The patient was provided an opportunity to ask questions and all were answered. The patient agreed with the plan and demonstrated an understanding of the instructions.   The patient was advised to call back or seek an in-person evaluation if the symptoms worsen or if the condition fails to improve as anticipated.  I provided  15 minutes of non-face-to-face time during this encounter.       Review of Systems     Objective:   Physical Exam Today's visit was via telephone Physical exam was not possible  for this visit        Assessment & Plan:  Recent COVID Secondary bronchitis Sounds like he is doing better No need for further antibiotics Reassurance given Patient would like to be seen next week for recheck We will set this up He does have some slight regurgitation issues but no dysphagia No need for EGD or GI consult currently

## 2021-08-08 ENCOUNTER — Encounter: Payer: Self-pay | Admitting: Family Medicine

## 2021-08-08 ENCOUNTER — Ambulatory Visit (INDEPENDENT_AMBULATORY_CARE_PROVIDER_SITE_OTHER): Payer: Medicare Other | Admitting: Family Medicine

## 2021-08-08 ENCOUNTER — Other Ambulatory Visit: Payer: Self-pay

## 2021-08-08 VITALS — BP 130/70 | HR 75 | Temp 98.8°F | Ht 67.0 in | Wt 160.0 lb

## 2021-08-08 DIAGNOSIS — E785 Hyperlipidemia, unspecified: Secondary | ICD-10-CM

## 2021-08-08 DIAGNOSIS — I10 Essential (primary) hypertension: Secondary | ICD-10-CM | POA: Diagnosis not present

## 2021-08-08 DIAGNOSIS — Z79899 Other long term (current) drug therapy: Secondary | ICD-10-CM | POA: Diagnosis not present

## 2021-08-08 DIAGNOSIS — Z125 Encounter for screening for malignant neoplasm of prostate: Secondary | ICD-10-CM | POA: Diagnosis not present

## 2021-08-08 NOTE — Progress Notes (Signed)
° °  Subjective:    Patient ID: David Pham, male    DOB: [birth year], 71 y.o.   MRN: [id number]  HPI  Follow up for bronchitis - still having cough and clearing throat - [facility] gave cough syrup and doxycycline Ears feeling stuffy  Patient with head congestion drainage coughing since his infection but states it is whitish phlegm denies wheezing or difficulty breathing Review of Systems     Objective:   Physical Exam   Gen-NAD not toxic TMS-normal bilateral T- normal no redness Chest-CTA respiratory rate normal no crackles CV RRR no murmur Skin-warm dry Neuro-grossly normal      Assessment & Plan:  Residual cough related to recent COVID and bronchitis.  No need for further antibiotics  Blood pressure good control continue current measures  Lipid-LDL 106 very important to get under better control healthy diet recommended statins discussed patient hesitant will work hard on diet recheck again summertime if not doing better initiate statins

## 2021-08-08 NOTE — Patient Instructions (Signed)

## 2021-08-24 ENCOUNTER — Ambulatory Visit: Payer: Medicare Other | Admitting: Family Medicine

## 2021-08-30 DIAGNOSIS — Z87442 Personal history of urinary calculi: Secondary | ICD-10-CM | POA: Diagnosis not present

## 2021-09-07 ENCOUNTER — Other Ambulatory Visit: Payer: Self-pay

## 2021-09-07 ENCOUNTER — Encounter: Payer: Self-pay | Admitting: Family Medicine

## 2021-09-07 ENCOUNTER — Ambulatory Visit (INDEPENDENT_AMBULATORY_CARE_PROVIDER_SITE_OTHER): Payer: Medicare Other | Admitting: Family Medicine

## 2021-09-07 VITALS — BP 142/83 | HR 76 | Temp 98.3°F | Wt 160.8 lb

## 2021-09-07 DIAGNOSIS — J988 Other specified respiratory disorders: Secondary | ICD-10-CM | POA: Diagnosis not present

## 2021-09-07 DIAGNOSIS — B9789 Other viral agents as the cause of diseases classified elsewhere: Secondary | ICD-10-CM | POA: Diagnosis not present

## 2021-09-07 MED ORDER — IPRATROPIUM BROMIDE 0.06 % NA SOLN: 2.0000 | Freq: Four times a day (QID) | NASAL | 0 refills | Status: DC | PRN

## 2021-09-07 MED ORDER — PROMETHAZINE-DM 6.25-15 MG/5ML PO SYRP: 5.0000 mL | ORAL_SOLUTION | Freq: Four times a day (QID) | ORAL | 0 refills | Status: DC | PRN

## 2021-09-07 NOTE — Patient Instructions (Signed)
This is likely viral in origin.  Continue the zyrtec.  Use the prescribed meds as directed.  Call if you fail to improve or worsen.  Take care  Dr. Lacinda Axon

## 2021-09-08 DIAGNOSIS — B9689 Other specified bacterial agents as the cause of diseases classified elsewhere: Secondary | ICD-10-CM | POA: Diagnosis not present

## 2021-09-08 DIAGNOSIS — B9789 Other viral agents as the cause of diseases classified elsewhere: Secondary | ICD-10-CM | POA: Insufficient documentation

## 2021-09-08 DIAGNOSIS — J208 Acute bronchitis due to other specified organisms: Secondary | ICD-10-CM | POA: Diagnosis not present

## 2021-09-08 DIAGNOSIS — J988 Other specified respiratory disorders: Secondary | ICD-10-CM | POA: Insufficient documentation

## 2021-09-08 NOTE — Assessment & Plan Note (Signed)
Patient's respiratory illness is likely viral in origin.  Treating with Atrovent nasal spray and Promethazine DM.  Supportive care.

## 2021-09-08 NOTE — Progress Notes (Signed)
Subjective:  Patient ID: David Pham, male    DOB: 1951-02-04  Age: 71 y.o. MRN: [id number]  CC: Chief Complaint  Patient presents with   Head Congestion    Head congestion x3 days, cough. Had [place] in December. Pt went to Urgent Care 07/13/21 and was treated for possible pneumonia     HPI:  71 year old male presents with respiratory symptoms.  Patient reports that he has been sick for 3 days.  He has had head congestion and associated cough.  No fever.  He has been taking Zyrtec without resolution.  No reports of shortness of breath.  No other associated symptoms.  No other complaints.  Patient Active Problem List   Diagnosis Date Noted   Viral respiratory infection 09/08/2021   Hyperlipidemia 01/28/2021   Fatty liver 10/01/2020   Toenail fungus 03/28/2020   Pain in left shoulder 02/18/2020   Seasonal and perennial allergic rhinitis 11/28/2019   Insect sting allergy 11/28/2019   Chronic throat clearing 09/29/2019   Mass of right knee 02/21/2017   HTN (hypertension) 11/06/2016   Left shoulder tendinitis 11/06/2016   Spondylolisthesis at L5-S1 level 09/23/2015   History of prostatectomy 05/11/2014   COLONIC POLYPS 06/01/2008   Allergic rhinitis 06/01/2008   ESOPHAGEAL REFLUX 11/29/2007    Social Hx   Social History   Socioeconomic History   Marital status: Single    Spouse name: Not on file   Number of children: Not on file   Years of education: Not on file   Highest education level: Not on file  Occupational History   Not on file  Tobacco Use   Smoking status: Never   Smokeless tobacco: Current    Types: Snuff  Vaping Use   Vaping Use: Never used  Substance and Sexual Activity   Alcohol use: Never   Drug use: Never   Sexual activity: Not on file  Other Topics Concern   Not on file  Social History Narrative   Not on file   Social Determinants of Health   Financial Resource Strain: Not on file  Food Insecurity: Not on file  Transportation Needs:  No Transportation Needs   Lack of Transportation (Medical): No   Lack of Transportation (Non-Medical): No  Physical Activity: Inactive   Days of Exercise per Week: 0 days   Minutes of Exercise per Session: 0 min  Stress: Not on file  Social Connections: Not on file    Review of Systems Per HPI  Objective:  BP (!) 142/83    Pulse 76    Temp 98.3 F (36.8 C)    Wt 160 lb 12.8 oz (72.9 kg)    SpO2 96%    BMI 25.18 kg/m   BP/Weight 09/07/2021 08/08/2021 06/30/7866  Systolic BP 672 094 709  Diastolic BP 83 70 80  Wt. (Lbs) 160.8 160 -  BMI 25.18 25.06 -    Physical Exam Vitals and nursing note reviewed.  Constitutional:      General: He is not in acute distress.    Appearance: Normal appearance. He is not ill-appearing.  HENT:     Head: Normocephalic and atraumatic.  Cardiovascular:     Rate and Rhythm: Normal rate and regular rhythm.  Pulmonary:     Effort: Pulmonary effort is normal.     Breath sounds: Normal breath sounds. No wheezing, rhonchi or rales.  Neurological:     Mental Status: He is alert.  Psychiatric:        Mood and Affect:  Mood normal.        Behavior: Behavior normal.    Lab Results  Component Value Date   WBC 18.6 (H) 05/10/2021   HGB 14.1 05/10/2021   HCT 43.7 05/10/2021   PLT 209 05/10/2021   GLUCOSE 96 07/26/2021   CHOL 171 07/26/2021   TRIG 118 07/26/2021   HDL 44 07/26/2021   LDLCALC 106 (H) 07/26/2021   ALT 17 05/10/2021   AST 19 05/10/2021   NA 141 07/26/2021   K 4.2 07/26/2021   CL 105 07/26/2021   CREATININE 0.79 07/26/2021   BUN 9 07/26/2021   CO2 25 07/26/2021   TSH 1.690 10/23/2015   PSA 5.14 (H) 03/22/2009     Assessment & Plan:   Problem List Items Addressed This Visit       Respiratory   Viral respiratory infection - Primary    Patient's respiratory illness is likely viral in origin.  Treating with Atrovent nasal spray and Promethazine DM.  Supportive care.       Meds ordered this encounter  Medications    ipratropium (ATROVENT) 0.06 % nasal spray    Sig: Place 2 sprays into both nostrils 4 (four) times daily as needed for rhinitis.    Dispense:  15 mL    Refill:  0   promethazine-dextromethorphan (PROMETHAZINE-DM) 6.25-15 MG/5ML syrup    Sig: Take 5 mLs by mouth 4 (four) times daily as needed for cough.    Dispense:  118 mL    Refill:  [place]

## 2021-09-29 DIAGNOSIS — M25511 Pain in right shoulder: Secondary | ICD-10-CM | POA: Diagnosis not present

## 2021-10-06 DIAGNOSIS — M47816 Spondylosis without myelopathy or radiculopathy, lumbar region: Secondary | ICD-10-CM | POA: Diagnosis not present

## 2021-10-06 DIAGNOSIS — M9903 Segmental and somatic dysfunction of lumbar region: Secondary | ICD-10-CM | POA: Diagnosis not present

## 2021-10-06 DIAGNOSIS — S335XXA Sprain of ligaments of lumbar spine, initial encounter: Secondary | ICD-10-CM | POA: Diagnosis not present

## 2021-10-06 DIAGNOSIS — S233XXA Sprain of ligaments of thoracic spine, initial encounter: Secondary | ICD-10-CM | POA: Diagnosis not present

## 2021-10-12 DIAGNOSIS — S233XXA Sprain of ligaments of thoracic spine, initial encounter: Secondary | ICD-10-CM | POA: Diagnosis not present

## 2021-10-12 DIAGNOSIS — S335XXA Sprain of ligaments of lumbar spine, initial encounter: Secondary | ICD-10-CM | POA: Diagnosis not present

## 2021-10-12 DIAGNOSIS — M9903 Segmental and somatic dysfunction of lumbar region: Secondary | ICD-10-CM | POA: Diagnosis not present

## 2021-10-12 DIAGNOSIS — M47816 Spondylosis without myelopathy or radiculopathy, lumbar region: Secondary | ICD-10-CM | POA: Diagnosis not present

## 2021-10-14 DIAGNOSIS — M9903 Segmental and somatic dysfunction of lumbar region: Secondary | ICD-10-CM | POA: Diagnosis not present

## 2021-10-14 DIAGNOSIS — M47816 Spondylosis without myelopathy or radiculopathy, lumbar region: Secondary | ICD-10-CM | POA: Diagnosis not present

## 2021-10-14 DIAGNOSIS — S233XXA Sprain of ligaments of thoracic spine, initial encounter: Secondary | ICD-10-CM | POA: Diagnosis not present

## 2021-10-14 DIAGNOSIS — S335XXA Sprain of ligaments of lumbar spine, initial encounter: Secondary | ICD-10-CM | POA: Diagnosis not present

## 2021-10-19 DIAGNOSIS — S233XXA Sprain of ligaments of thoracic spine, initial encounter: Secondary | ICD-10-CM | POA: Diagnosis not present

## 2021-10-19 DIAGNOSIS — M47816 Spondylosis without myelopathy or radiculopathy, lumbar region: Secondary | ICD-10-CM | POA: Diagnosis not present

## 2021-10-19 DIAGNOSIS — S335XXA Sprain of ligaments of lumbar spine, initial encounter: Secondary | ICD-10-CM | POA: Diagnosis not present

## 2021-10-19 DIAGNOSIS — M9903 Segmental and somatic dysfunction of lumbar region: Secondary | ICD-10-CM | POA: Diagnosis not present

## 2021-10-25 DIAGNOSIS — S335XXA Sprain of ligaments of lumbar spine, initial encounter: Secondary | ICD-10-CM | POA: Diagnosis not present

## 2021-10-25 DIAGNOSIS — M9903 Segmental and somatic dysfunction of lumbar region: Secondary | ICD-10-CM | POA: Diagnosis not present

## 2021-10-25 DIAGNOSIS — S233XXA Sprain of ligaments of thoracic spine, initial encounter: Secondary | ICD-10-CM | POA: Diagnosis not present

## 2021-10-25 DIAGNOSIS — M47816 Spondylosis without myelopathy or radiculopathy, lumbar region: Secondary | ICD-10-CM | POA: Diagnosis not present

## 2021-11-01 DIAGNOSIS — M47816 Spondylosis without myelopathy or radiculopathy, lumbar region: Secondary | ICD-10-CM | POA: Diagnosis not present

## 2021-11-01 DIAGNOSIS — S233XXA Sprain of ligaments of thoracic spine, initial encounter: Secondary | ICD-10-CM | POA: Diagnosis not present

## 2021-11-01 DIAGNOSIS — M9903 Segmental and somatic dysfunction of lumbar region: Secondary | ICD-10-CM | POA: Diagnosis not present

## 2021-11-01 DIAGNOSIS — S335XXA Sprain of ligaments of lumbar spine, initial encounter: Secondary | ICD-10-CM | POA: Diagnosis not present

## 2021-11-16 ENCOUNTER — Inpatient Hospital Stay (HOSPITAL_COMMUNITY): Payer: Medicare Other | Attending: Hematology

## 2021-11-16 DIAGNOSIS — C9110 Chronic lymphocytic leukemia of B-cell type not having achieved remission: Secondary | ICD-10-CM | POA: Insufficient documentation

## 2021-11-16 DIAGNOSIS — Z79899 Other long term (current) drug therapy: Secondary | ICD-10-CM | POA: Diagnosis not present

## 2021-11-16 DIAGNOSIS — D72820 Lymphocytosis (symptomatic): Secondary | ICD-10-CM

## 2021-11-16 LAB — COMPREHENSIVE METABOLIC PANEL
ALT: 20 U/L (ref 0–44)
AST: 24 U/L (ref 15–41)
Albumin: 4 g/dL (ref 3.5–5.0)
Alkaline Phosphatase: 65 U/L (ref 38–126)
Anion gap: 5 (ref 5–15)
BUN: 17 mg/dL (ref 8–23)
CO2: 26 mmol/L (ref 22–32)
Calcium: 9 mg/dL (ref 8.9–10.3)
Chloride: 109 mmol/L (ref 98–111)
Creatinine, Ser: 0.87 mg/dL (ref 0.61–1.24)
GFR, Estimated: >60 mL/min (ref >60)
Glucose, Bld: 96 mg/dL (ref 70–99)
Potassium: 4 mmol/L (ref 3.5–5.1)
Sodium: 140 mmol/L (ref 135–145)
Total Bilirubin: 1.2 mg/dL (ref 0.3–1.2)
Total Protein: 6.9 g/dL (ref 6.5–8.1)

## 2021-11-16 LAB — CBC WITH DIFFERENTIAL/PLATELET
Abs Immature Granulocytes: 0.04 10*3/uL (ref 0.00–0.07)
Basophils Absolute: 0.1 10*3/uL (ref 0.0–0.1)
Basophils Relative: 0 %
Eosinophils Absolute: 0.1 10*3/uL (ref 0.0–0.5)
Eosinophils Relative: 0 %
HCT: 41.7 % (ref 39.0–52.0)
Hemoglobin: 13.6 g/dL (ref 13.0–17.0)
Immature Granulocytes: 0 %
Lymphocytes Relative: 85 %
Lymphs Abs: 22 10*3/uL — ABNORMAL HIGH (ref 0.7–4.0)
MCH: 30.6 pg (ref 26.0–34.0)
MCHC: 32.6 g/dL (ref 30.0–36.0)
MCV: 93.7 fL (ref 80.0–100.0)
Monocytes Absolute: 0.5 10*3/uL (ref 0.1–1.0)
Monocytes Relative: 2 %
Neutro Abs: 3.3 10*3/uL (ref 1.7–7.7)
Neutrophils Relative %: 13 %
Platelets: 201 10*3/uL (ref 150–400)
RBC: 4.45 MIL/uL (ref 4.22–5.81)
RDW: 12.9 % (ref 11.5–15.5)
WBC: 25.9 10*3/uL — ABNORMAL HIGH (ref 4.0–10.5)
nRBC: 0 % (ref 0.0–0.2)

## 2021-11-16 LAB — LACTATE DEHYDROGENASE: LDH: 127 U/L (ref 98–192)

## 2021-11-18 DIAGNOSIS — M47816 Spondylosis without myelopathy or radiculopathy, lumbar region: Secondary | ICD-10-CM | POA: Diagnosis not present

## 2021-11-18 DIAGNOSIS — S335XXA Sprain of ligaments of lumbar spine, initial encounter: Secondary | ICD-10-CM | POA: Diagnosis not present

## 2021-11-18 DIAGNOSIS — S233XXA Sprain of ligaments of thoracic spine, initial encounter: Secondary | ICD-10-CM | POA: Diagnosis not present

## 2021-11-18 DIAGNOSIS — M9903 Segmental and somatic dysfunction of lumbar region: Secondary | ICD-10-CM | POA: Diagnosis not present

## 2021-11-23 ENCOUNTER — Inpatient Hospital Stay (HOSPITAL_COMMUNITY): Payer: Medicare Other | Attending: Hematology | Admitting: Hematology

## 2021-11-23 VITALS — BP 141/85 | HR 64 | Temp 98.1°F | Ht 70.0 in | Wt 161.8 lb

## 2021-11-23 DIAGNOSIS — I10 Essential (primary) hypertension: Secondary | ICD-10-CM | POA: Diagnosis not present

## 2021-11-23 DIAGNOSIS — Z8546 Personal history of malignant neoplasm of prostate: Secondary | ICD-10-CM | POA: Insufficient documentation

## 2021-11-23 DIAGNOSIS — C9110 Chronic lymphocytic leukemia of B-cell type not having achieved remission: Secondary | ICD-10-CM | POA: Diagnosis not present

## 2021-11-23 DIAGNOSIS — Z803 Family history of malignant neoplasm of breast: Secondary | ICD-10-CM | POA: Insufficient documentation

## 2021-11-23 DIAGNOSIS — F17220 Nicotine dependence, chewing tobacco, uncomplicated: Secondary | ICD-10-CM | POA: Diagnosis not present

## 2021-11-23 DIAGNOSIS — D72820 Lymphocytosis (symptomatic): Secondary | ICD-10-CM

## 2021-11-23 DIAGNOSIS — Z8616 Personal history of COVID-19: Secondary | ICD-10-CM | POA: Diagnosis not present

## 2021-11-23 NOTE — Patient Instructions (Addendum)
[place] at [facility] ?Discharge Instructions ? ? ?You were seen and examined today by Dr. Delton Coombes. ? ?He reviewed the results of your lab work which are normal/stable.  ? ?Return as scheduled in 6 months.  ? ? ?Thank you for choosing [place] at [facility] to provide your oncology and hematology care.  To afford each patient quality time with our provider, please arrive at least 15 minutes before your scheduled appointment time.  ? ?If you have a lab appointment with the [facility] please come in thru the Main Entrance and check in at the main information desk. ? ?You need to re-schedule your appointment should you arrive 10 or more minutes late.  We strive to give you quality time with our providers, and arriving late affects you and other patients whose appointments are after yours.  Also, if you no show three or more times for appointments you may be dismissed from the clinic at the providers discretion.     ?Again, thank you for choosing [facility].  Our hope is that these requests will decrease the amount of time that you wait before being seen by our physicians.       ?_____________________________________________________________ ? ?Should you have questions after your visit to [facility], please contact our office at [phone] and follow the prompts.  Our office hours are 8:00 a.m. and 4:30 p.m. Monday - Friday.  Please note that voicemails left after 4:00 p.m. may not be returned until the following business day.  We are closed weekends and major holidays.  You do have access to a nurse 24-7, just call the main number to the clinic [phone] and do not press any options, hold on the line and a nurse will answer the phone.   ? ?For prescription refill requests, have your pharmacy contact our office and allow 72 hours.   ? ?Due to Covid, you will need to wear a mask upon entering the hospital. If you do not have a  mask, a mask will be given to you at the Main Entrance upon arrival. For doctor visits, patients may have 1 support person age 53 or older with them. For treatment visits, patients can not have anyone with them due to social distancing guidelines and our immunocompromised population.  ? ?   ?

## 2021-11-23 NOTE — Progress Notes (Signed)
? ?David Pham ?[address] ?Pham, David Pham ? ? ?CLINIC:  ?Medical Oncology/Hematology ? ?PCP:  ?David Drown, MD ?David Pham / [place] David Pham  ?David Pham ? ?REASON FOR VISIT:  ?Follow-up for CLL ? ?PRIOR THERAPY: none ? ?CURRENT THERAPY: surveillance ? ?INTERVAL HISTORY:  ?[name], a 71 y.o. male, returns for routine follow-up for his CLL. David Pham was last seen on 05/17/2021. ? ?Today he reports feeling good. He reports a Covid infection 06/23/21 from which he experienced fevers, chills, and sore throat; this infection did not require hospitalization. He also reports he had pneumonia which was treated with antibiotics. He also reports a sinus infection, but he cannot recall if he required antibiotics for this infection. He denies fevers, night sweats, weight loss, and lumps.  ? ?REVIEW OF SYSTEMS:  ?Review of Systems  ?Constitutional:  Negative for appetite change, fatigue, fever and unexpected weight change.  ?HENT:   Negative for lump/mass.   ?Endocrine: Negative for hot flashes.  ?Musculoskeletal:  Positive for arthralgias (2/10).  ?All other systems reviewed and are negative. ? ?PAST MEDICAL/SURGICAL HISTORY:  ?Past Medical History:  ?Diagnosis Date  ? Cancer ([place]) 09/2009  ? prostate  ? Hemorrhoids   ? Incontinence of feces   ? Rectal pain   ? Toenail fungus 03/28/2020  ? I would not recommend oral medication due to liver toxicity potential, treating topically  ? White coat hypertension   ? ?Past Surgical History:  ?Procedure Laterality Date  ? BIOPSY  11/17/2020  ? Procedure: BIOPSY;  Surgeon: Harvel Quale, MD;  [facility];  Service: Gastroenterology;;  ? COLONOSCOPY  2006 and 2012  ? COLONOSCOPY  08/04/2010  ? COLONOSCOPY WITH PROPOFOL N/A 11/17/2020  ? Procedure: COLONOSCOPY WITH PROPOFOL;  Surgeon: Harvel Quale, MD;  [facility];  Service: Gastroenterology;  Laterality: N/A;  830  ?  ESOPHAGOGASTRODUODENOSCOPY (EGD) WITH PROPOFOL N/A 11/17/2020  ? Procedure: ESOPHAGOGASTRODUODENOSCOPY (EGD) WITH PROPOFOL;  Surgeon: Harvel Quale, MD;  [facility];  Service: Gastroenterology;  Laterality: N/A;  ? PROSTATECTOMY    ? 2011  ? ? ?SOCIAL HISTORY:  ?Social History  ? ?Socioeconomic History  ? Marital status: Single  ?  Spouse name: Not on file  ? Number of children: Not on file  ? Years of education: Not on file  ? Highest education level: Not on file  ?Occupational History  ? Not on file  ?Tobacco Use  ? Smoking status: Never  ? Smokeless tobacco: Current  ?  Types: Snuff  ?Vaping Use  ? Vaping Use: Never used  ?Substance and Sexual Activity  ? Alcohol use: Never  ? Drug use: Never  ? Sexual activity: Not on file  ?Other Topics Concern  ? Not on file  ?Social History Narrative  ? Not on file  ? ?Social Determinants of Health  ? ?Financial Resource Strain: Not on file  ?Food Insecurity: Not on file  ?Transportation Needs: Not on file  ?Physical Activity: Not on file  ?Stress: Not on file  ?Social Connections: Not on file  ?Intimate Partner Violence: Not on file  ? ? ?FAMILY HISTORY:  ?Family History  ?Problem Relation Age of Onset  ? Cancer Sister   ?     breast  ? Hypertension Brother   ? Diabetes Brother   ? Emphysema Mother   ? Asthma Father   ? Allergic rhinitis Father   ? Angioedema Neg Hx   ?  Atopy Neg Hx   ? Eczema Neg Hx   ? Immunodeficiency Neg Hx   ? Urticaria Neg Hx   ? ? ?CURRENT MEDICATIONS:  ?Current Outpatient Medications  ?Medication Sig Dispense Refill  ? acetaminophen (TYLENOL) 500 MG tablet Take 1,000 mg by mouth at bedtime as needed (pain).    ? amLODipine (NORVASC) 2.5 MG tablet TAKE 1 TABLET BY MOUTH EVERY DAY 90 tablet 1  ? Artificial Tear Solution (SOOTHE XP OP) Place 1-2 drops into both eyes 3 (three) times daily as needed (dry/irritated eyes.).    ? Camphor-Menthol-Methyl Sal (SALONPAS) 3.07-29-08 % PTCH Place 1 patch onto the skin daily as needed (back  pain).    ? Cholecalciferol (VITAMIN D3) 50 MCG (2000 UT) TABS Take 2,000 Units by mouth in the morning.    ? ipratropium (ATROVENT) 0.06 % nasal spray Place 2 sprays into both nostrils 4 (four) times daily as needed for rhinitis. 15 mL 0  ? omeprazole (PRILOSEC) 20 MG capsule TAKE 1 CAPSULE BY MOUTH EVERY DAY 90 capsule 1  ? Polyethyl Glycol-Propyl Glycol (SYSTANE) 0.4-0.3 % SOLN Place 1-2 drops into both eyes 3 (three) times daily as needed (dry/irritated eyes.).    ? polyethylene glycol-electrolytes (NULYTELY) 420 g solution Take by mouth once.    ? Polyvinyl Alcohol-Povidone (REFRESH OP) Place 1-2 drops into both eyes 3 (three) times daily as needed (dry/irritated eyes.).    ? promethazine-dextromethorphan (PROMETHAZINE-DM) 6.25-15 MG/5ML syrup Take 5 mLs by mouth 4 (four) times daily as needed for cough. 118 mL 0  ? vitamin B-12 (CYANOCOBALAMIN) 1000 MCG tablet Take 1,000 mcg by mouth in the morning.    ? ?No current facility-administered medications for this visit.  ? ? ?ALLERGIES:  ?Allergies  ?Allergen Reactions  ? Penicillins Other (See Comments)  ?  Unknown reaction (possible rash)  ? Codeine Palpitations  ? Morphine And Related Palpitations  ? ? ?PHYSICAL EXAM:  ?Performance status (ECOG): 0 - Asymptomatic ? ?There were no vitals filed for this visit. ?Wt Readings from Last 3 Encounters:  ?09/07/21 160 lb 12.8 oz (72.9 kg)  ?08/08/21 160 lb (72.6 kg)  ?06/23/21 164 lb 9.6 oz (74.7 kg)  ? ?Physical Exam ?Vitals reviewed.  ?Constitutional:   ?   Appearance: Normal appearance.  ?Cardiovascular:  ?   Rate and Rhythm: Normal rate and regular rhythm.  ?   Pulses: Normal pulses.  ?   Heart sounds: Normal heart sounds.  ?Pulmonary:  ?   Effort: Pulmonary effort is normal.  ?   Breath sounds: Normal breath sounds.  ?Abdominal:  ?   Palpations: Abdomen is soft. There is no hepatomegaly, splenomegaly or mass.  ?   Tenderness: There is no abdominal tenderness.  ?Musculoskeletal:  ?   Right lower leg: No edema.  ?    Left lower leg: No edema.  ?Lymphadenopathy:  ?   Cervical: No cervical adenopathy.  ?   Right cervical: No superficial cervical adenopathy. ?   Left cervical: No superficial cervical adenopathy.  ?   Upper Body:  ?   Right upper body: No supraclavicular or axillary adenopathy.  ?   Left upper body: No supraclavicular or axillary adenopathy.  ?   Lower Body: No right inguinal adenopathy. No left inguinal adenopathy.  ?Neurological:  ?   General: No focal deficit present.  ?   Mental Status: He is alert and oriented to person, place, and time.  ?Psychiatric:     ?   Mood and Affect: Mood normal.     ?  Behavior: Behavior normal.  ? ? ?LABORATORY DATA:  ?I have reviewed the labs as listed.  ? ?  Latest Ref Rng & Units 11/16/2021  ?  8:20 AM 05/10/2021  ? 10:25 AM 01/28/2021  ? 12:04 PM  ?CBC  ?WBC 4.0 - 10.5 K/uL 25.9   18.6   23.7    ?Hemoglobin 13.0 - 17.0 g/dL 13.6   14.1   14.6    ?Hematocrit 39.0 - 52.0 % 41.7   43.7   43.0    ?Platelets 150 - 400 K/uL 201   209   218    ? ? ?  Latest Ref Rng & Units 11/16/2021  ?  8:20 AM 07/26/2021  ?  8:03 AM 05/10/2021  ? 10:25 AM  ?CMP  ?Glucose 70 - 99 mg/dL 96   96   88    ?BUN 8 - 23 mg/dL '17   9   11    '$ ?Creatinine 0.61 - 1.24 mg/dL 0.87   0.79   0.89    ?Sodium 135 - 145 mmol/L 140   141   140    ?Potassium 3.5 - 5.1 mmol/L 4.0   4.2   4.0    ?Chloride 98 - 111 mmol/L 109   105   107    ?CO2 22 - 32 mmol/L '26   25   28    '$ ?Calcium 8.9 - 10.3 mg/dL 9.0   9.1   8.8    ?Total Protein 6.5 - 8.1 g/dL 6.9    7.2    ?Total Bilirubin 0.3 - 1.2 mg/dL 1.2    0.8    ?Alkaline Phos 38 - 126 U/L 65    65    ?AST 15 - 41 U/L 24    19    ?ALT 0 - 44 U/L 20    17    ? ?   ?Component Value Date/Time  ? RBC 4.45 11/16/2021 0820  ? MCV 93.7 11/16/2021 0820  ? MCV 89 01/28/2021 1204  ? MCH 30.6 11/16/2021 0820  ? MCHC 32.6 11/16/2021 0820  ? RDW 12.9 11/16/2021 0820  ? RDW 12.5 01/28/2021 1204  ? LYMPHSABS 22.0 (H) 11/16/2021 0820  ? LYMPHSABS 16.5 (H) 01/28/2021 1204  ? MONOABS 0.5 11/16/2021  0820  ? EOSABS 0.1 11/16/2021 0820  ? EOSABS 0.1 01/28/2021 1204  ? BASOSABS 0.1 11/16/2021 0820  ? BASOSABS 0.1 01/28/2021 1204  ? ? ?DIAGNOSTIC IMAGING:  ?I have independently reviewed the scans and discussed with the p

## 2021-12-13 DIAGNOSIS — Z08 Encounter for follow-up examination after completed treatment for malignant neoplasm: Secondary | ICD-10-CM | POA: Diagnosis not present

## 2021-12-13 DIAGNOSIS — D225 Melanocytic nevi of trunk: Secondary | ICD-10-CM | POA: Diagnosis not present

## 2021-12-13 DIAGNOSIS — Z1283 Encounter for screening for malignant neoplasm of skin: Secondary | ICD-10-CM | POA: Diagnosis not present

## 2021-12-13 DIAGNOSIS — Z85828 Personal history of other malignant neoplasm of skin: Secondary | ICD-10-CM | POA: Diagnosis not present

## 2021-12-13 DIAGNOSIS — L820 Inflamed seborrheic keratosis: Secondary | ICD-10-CM | POA: Diagnosis not present

## 2022-01-10 ENCOUNTER — Telehealth: Payer: Self-pay

## 2022-01-10 NOTE — Telephone Encounter (Signed)
Caller name:David Pham   On DPR? :Yes  Call back number:[phone]  Provider they see: [place]   Reason for call:Pt has jury duty July 10 and needs letter before then to be able to get out of jury duty hard for him to sit that long. Also pt has a Phy July 17 and needs blood work ordered before then.

## 2022-01-11 NOTE — Telephone Encounter (Signed)
So in order to be detailed in the letter I need to know from the patient how long can he sit at 1 time without having to get up and move around or having to lay down?

## 2022-01-12 NOTE — Telephone Encounter (Signed)
Patient states he can sit at least 2 hours before his back starts hurting then he has to get up and move around.

## 2022-01-13 ENCOUNTER — Encounter: Payer: Self-pay | Admitting: Family Medicine

## 2022-01-17 ENCOUNTER — Other Ambulatory Visit: Payer: Self-pay | Admitting: Family Medicine

## 2022-01-31 DIAGNOSIS — Z79899 Other long term (current) drug therapy: Secondary | ICD-10-CM | POA: Diagnosis not present

## 2022-01-31 DIAGNOSIS — I10 Essential (primary) hypertension: Secondary | ICD-10-CM | POA: Diagnosis not present

## 2022-01-31 DIAGNOSIS — E785 Hyperlipidemia, unspecified: Secondary | ICD-10-CM | POA: Diagnosis not present

## 2022-02-01 LAB — LIPID PANEL
Chol/HDL Ratio: 3.8 ratio (ref 0.0–5.0)
Cholesterol, Total: 184 mg/dL (ref 100–199)
HDL: 48 mg/dL (ref >39)
LDL Chol Calc (NIH): 117 mg/dL — ABNORMAL HIGH (ref 0–99)
Triglycerides: 104 mg/dL (ref 0–149)
VLDL Cholesterol Cal: 19 mg/dL (ref 5–40)

## 2022-02-01 LAB — HEPATIC FUNCTION PANEL
ALT: 16 IU/L (ref 0–44)
AST: 17 IU/L (ref 0–40)
Albumin: 4.5 g/dL (ref 3.8–4.8)
Alkaline Phosphatase: 72 IU/L (ref 44–121)
Bilirubin Total: 0.9 mg/dL (ref 0.0–1.2)
Bilirubin, Direct: 0.21 mg/dL (ref 0.00–0.40)
Total Protein: 6.9 g/dL (ref 6.0–8.5)

## 2022-02-01 LAB — BASIC METABOLIC PANEL
BUN/Creatinine Ratio: 14 (ref 10–24)
BUN: 12 mg/dL (ref 8–27)
CO2: 23 mmol/L (ref 20–29)
Calcium: 9.4 mg/dL (ref 8.6–10.2)
Chloride: 103 mmol/L (ref 96–106)
Creatinine, Ser: 0.87 mg/dL (ref 0.76–1.27)
Glucose: 93 mg/dL (ref 70–99)
Potassium: 4 mmol/L (ref 3.5–5.2)
Sodium: 139 mmol/L (ref 134–144)
eGFR: 92 mL/min/{1.73_m2} (ref >59)

## 2022-02-01 LAB — PSA: Prostate Specific Ag, Serum: <0.1 ng/mL (ref 0.0–4.0)

## 2022-02-06 ENCOUNTER — Ambulatory Visit (INDEPENDENT_AMBULATORY_CARE_PROVIDER_SITE_OTHER): Payer: Medicare Other | Admitting: Family Medicine

## 2022-02-06 VITALS — BP 122/74 | HR 83 | Temp 98.2°F | Ht 70.0 in | Wt 161.0 lb

## 2022-02-06 DIAGNOSIS — E785 Hyperlipidemia, unspecified: Secondary | ICD-10-CM | POA: Diagnosis not present

## 2022-02-06 DIAGNOSIS — Z0000 Encounter for general adult medical examination without abnormal findings: Secondary | ICD-10-CM | POA: Diagnosis not present

## 2022-02-06 DIAGNOSIS — I491 Atrial premature depolarization: Secondary | ICD-10-CM | POA: Diagnosis not present

## 2022-02-06 NOTE — Progress Notes (Signed)
   Subjective:    Patient ID: David Pham, male    DOB: January 01, 1951, 71 y.o.   MRN: [id number]  HPI AWV- Annual Wellness Visit he relates he feels pretty good most days.  Denies any pressure tightness pain discomfort shortness of breath or swelling. He does have intermittent troubles with his shoulder and his back but nothing serious currently The patient was seen for their annual wellness visit. The patient's past medical history, surgical history, and family history were reviewed. Pertinent vaccines were reviewed ( tetanus, pneumonia, shingles, flu) The patient's medication list was reviewed and updated.  The height and weight were entered.  BMI recorded in electronic record elsewhere  Cognitive screening was completed. Outcome of Mini - Cog: 5   Falls /depression screening electronically recorded within record elsewhere  Current tobacco usage:snuff  (All patients who use tobacco were given written and verbal information on quitting)  Recent listing of emergency department/hospitalizations over the past year were reviewed.  current specialist the patient sees on a regular basis: neurology, hematology   Medicare annual wellness visit patient questionnaire was reviewed.  A written screening schedule for the patient for the next 5-10 years was given. Appropriate discussion of followup regarding next visit was discussed.   The 10-year ASCVD risk score (Arnett DK, et al., 2019) is: 22.6%   Values used to calculate the score:     Age: 71 years     Sex: Male     Is Non-Hispanic African American: No     Diabetic: No     Tobacco smoker: No     Systolic Blood Pressure: 277 mmHg     Is BP treated: Yes     HDL Cholesterol: 48 mg/dL     Total Cholesterol: 184 mg/dL    Review of Systems     Objective:   Physical Exam  General-in no acute distress Eyes-no discharge Lungs-respiratory rate normal, CTA CV-no murmurs,RRR Extremities skin warm dry no edema Neuro grossly  normal Behavior normal, alert Lipoma right knee soft tissue      Assessment & Plan:  Annual wellness visit Adult wellness-complete.wellness physical was conducted today. Importance of diet and exercise were discussed in detail.  Importance of stress reduction and healthy living were discussed.  In addition to this a discussion regarding safety was also covered.  We also reviewed over immunizations and gave recommendations regarding current immunization needed for age.   In addition to this additional areas were also touched on including: Preventative health exams needed:  Colonoscopy up-to-date  Patient was advised yearly wellness exam  Occasional PAC EKG looks good no acute changes Shingles vaccine recommended Mild hyperlipidemia patient defers on any statin he will repeat a lipid profile and a follow-up visit in December

## 2022-02-06 NOTE — Patient Instructions (Addendum)
Shingrix and shingles prevention: know the facts!   Shingrix is a very effective vaccine to prevent shingles.   Shingles is a reactivation of chickenpox -more than 99% of Americans born before 1980 have had chickenpox even if they do not remember it. One in every 10 people who get shingles have severe long-lasting nerve pain as a result.   33 out of a 100 older adults will get shingles if they are unvaccinated.     This vaccine is very important for your health This vaccine is indicated for anyone 50 years or older. You can get this vaccine even if you have already had shingles because you can get the disease more than once in a lifetime.  Your risk for shingles and its complications increases with age.  This vaccine has 2 doses.  The second dose would be 2 to 6 months after the first dose.  If you had Zostavax vaccine in the past you should still get Shingrix. ( Zostavax is only 70% effective and it loses significant strength over a few years .)  This vaccine is given through the pharmacy.  The cost of the vaccine is through your insurance. The pharmacy can inform you of the total costs.  Common side effects including soreness in the arm, some redness and swelling, also some feel fatigue muscle soreness headache low-grade fever.  Side effects typically go away within 2 to 3 days. Remember-the pain from shingles can last a lifetime but these side effects of the vaccine will only last a few days at most. It is very important to get both doses in order to protect yourself fully.   Please get this vaccine at your earliest convenience at your trusted pharmacy.  Up to date on colonoscopy

## 2022-03-19 DIAGNOSIS — J0100 Acute maxillary sinusitis, unspecified: Secondary | ICD-10-CM | POA: Diagnosis not present

## 2022-03-19 DIAGNOSIS — J302 Other seasonal allergic rhinitis: Secondary | ICD-10-CM | POA: Diagnosis not present

## 2022-04-13 ENCOUNTER — Other Ambulatory Visit: Payer: Self-pay | Admitting: Family Medicine

## 2022-04-15 IMAGING — US US ABDOMEN COMPLETE
1 series · 13 of 25 positions shown · non-contrast
Comparison: None

CLINICAL DATA: Epigastric pain, history of prostate cancer,
hypertension

EXAM:
ABDOMEN ULTRASOUND COMPLETE

[Series 1: us abdomen complete · 13 of 68 slices shown]
[im 1/68]
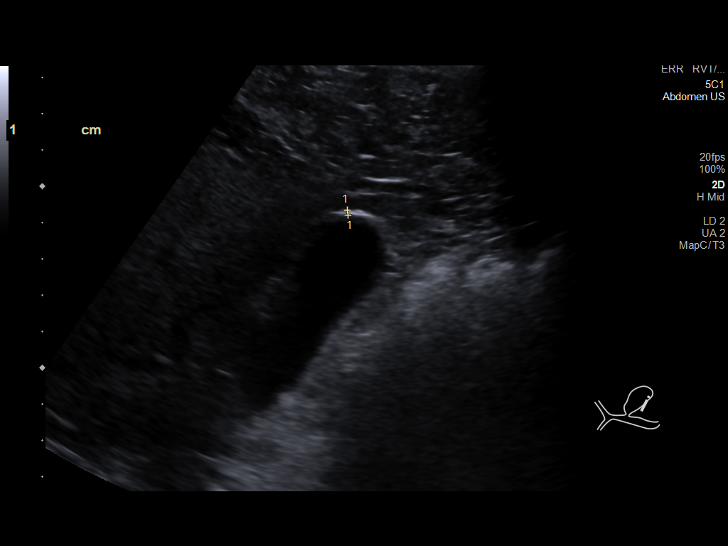
[im 6/68]
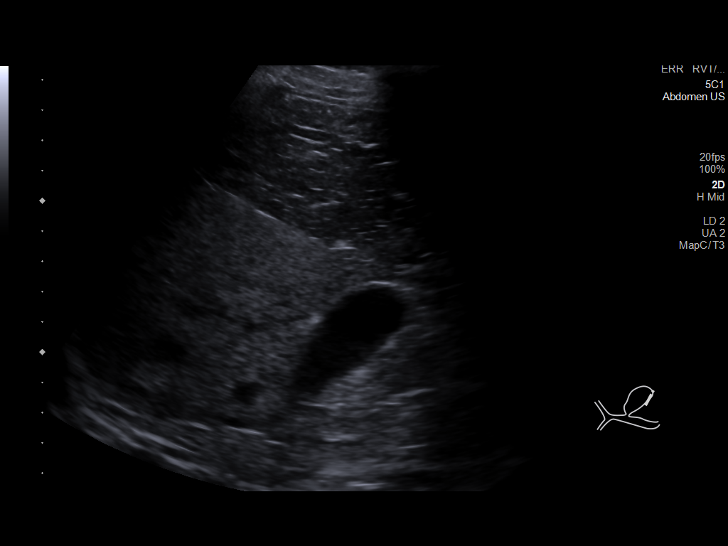
[im 12/68]
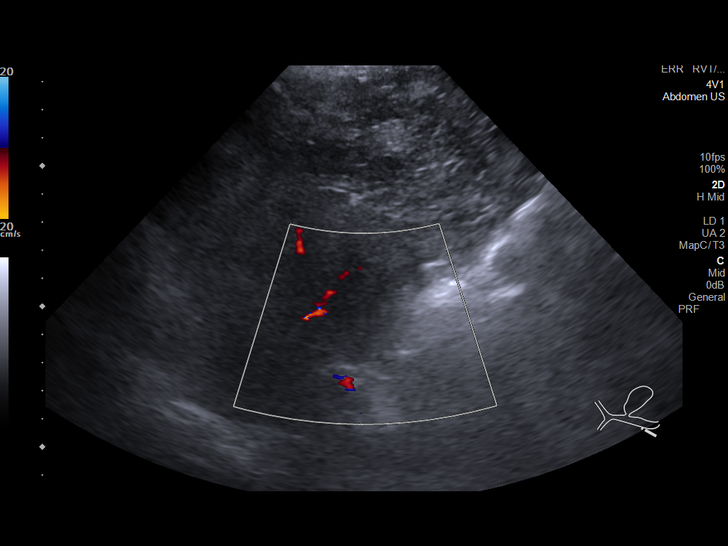
[im 17/68]
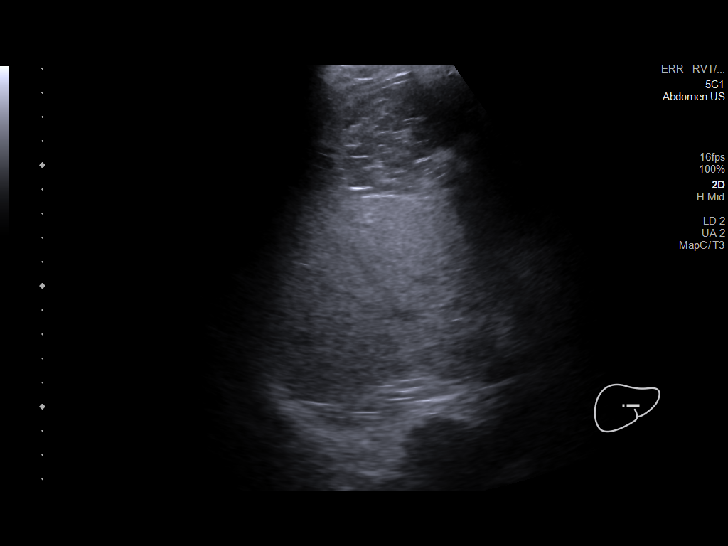
[im 23/68]
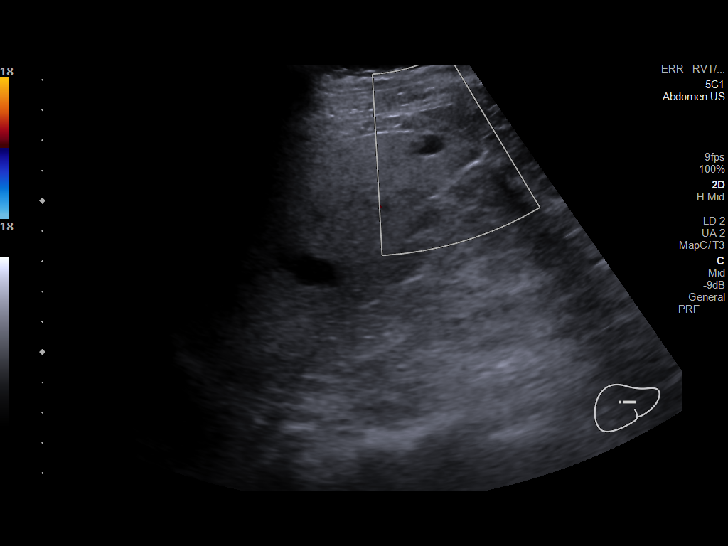
[im 28/68]
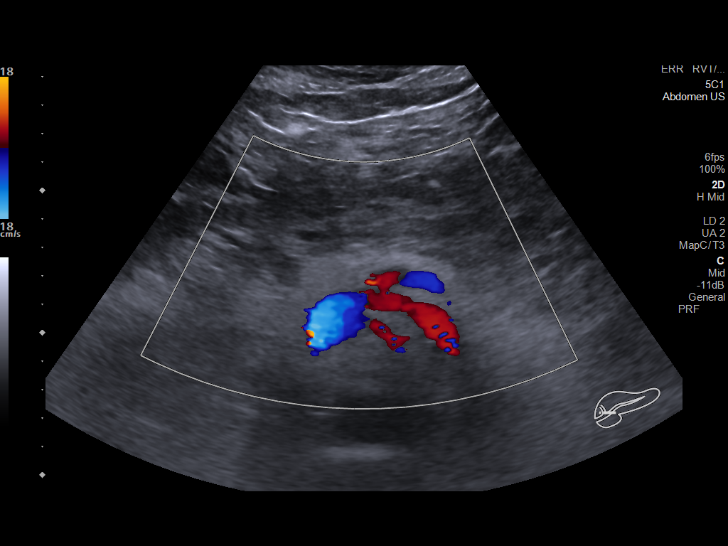
[im 34/68]
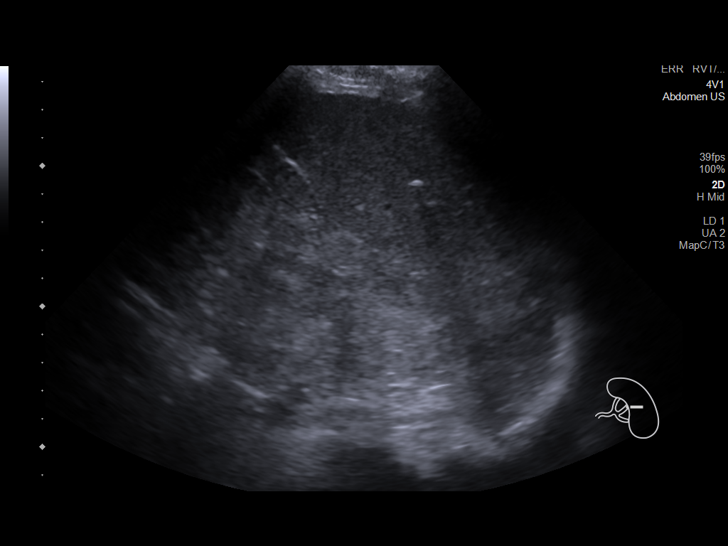
[im 40/68]
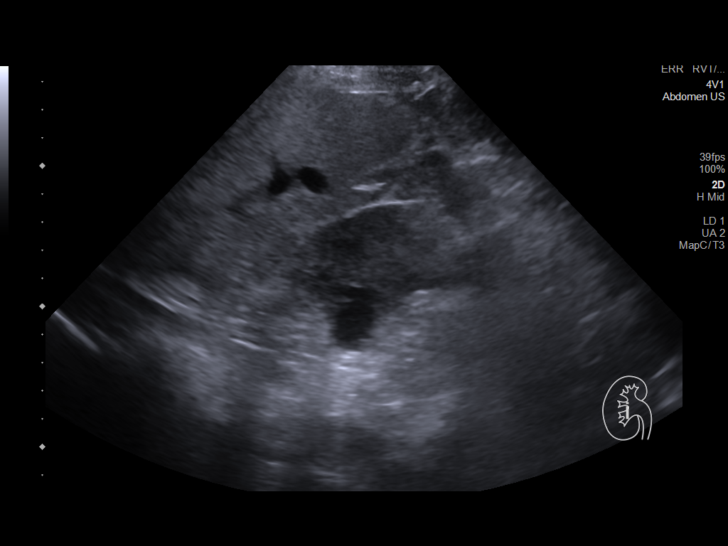
[im 45/68]
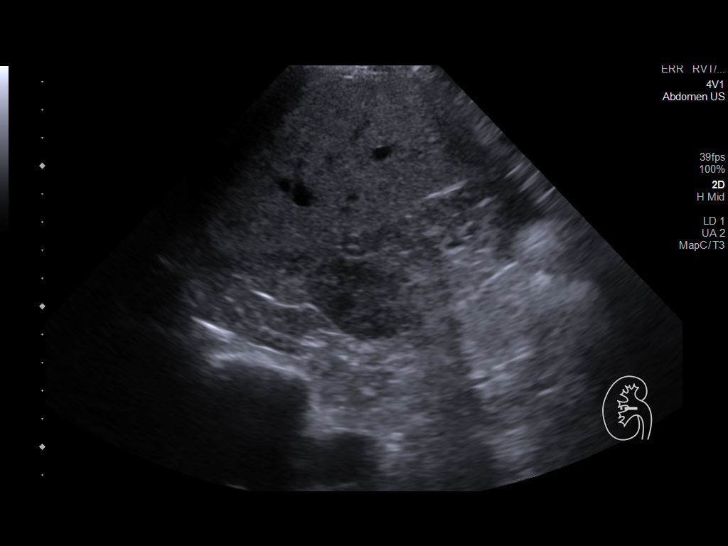
[im 51/68]
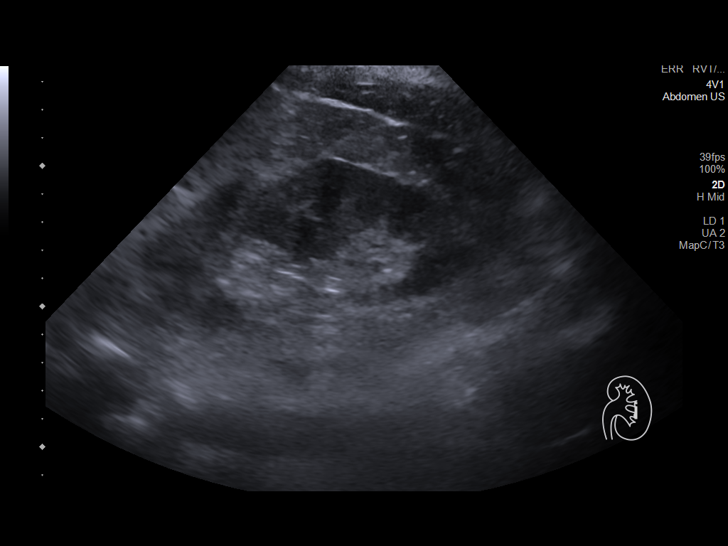
[im 56/68]
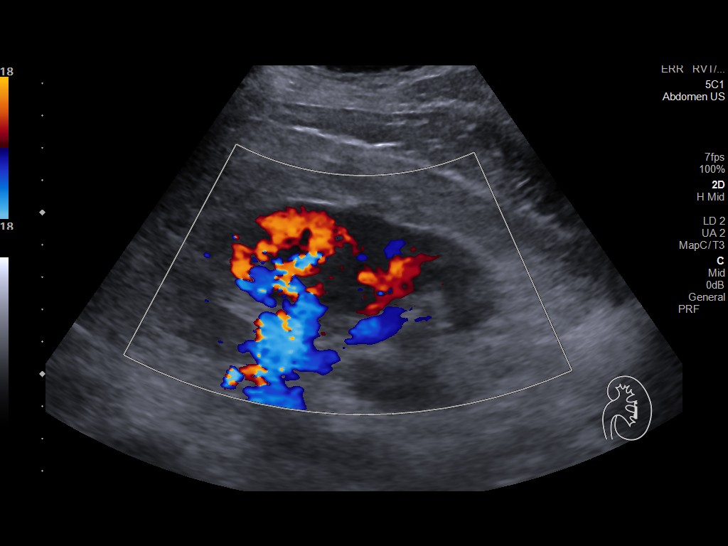
[im 62/68]
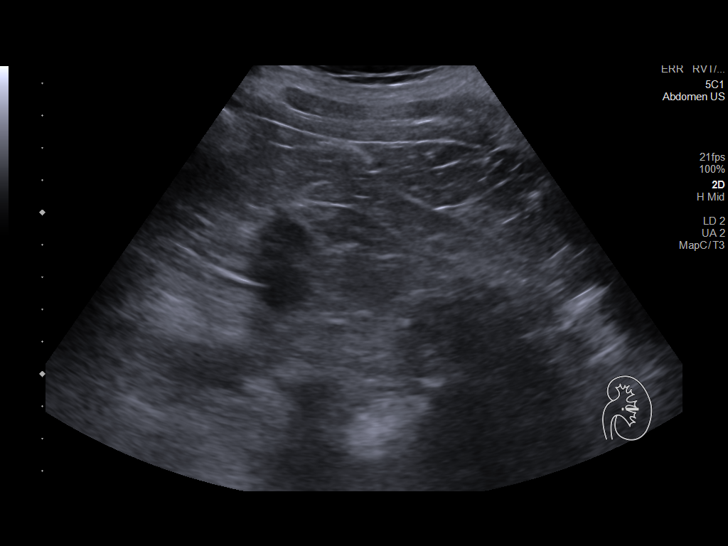
[im 68/68]
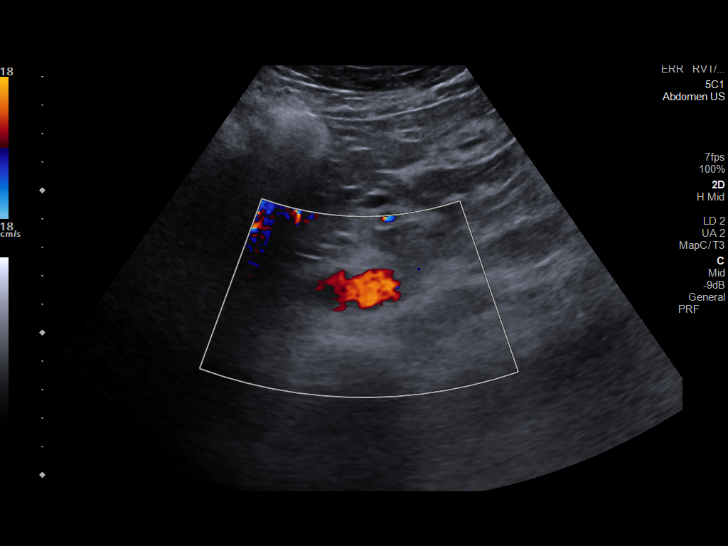

[13 of 25 positions shown; findings below may reference images not displayed]

FINDINGS: Gallbladder: Normally distended without stones or wall thickening.
No pericholecystic fluid or sonographic Murphy sign.

Common bile duct: Diameter: 3 mm, normal

Liver: Echogenic parenchyma, likely fatty infiltration though this
can be seen with cirrhosis and certain infiltrative disorders. Small
cyst RIGHT lobe 11 x 8 x 14 mm near free edge. No additional mass or
nodularity. Portal vein is patent on color Doppler imaging with
normal direction of blood flow towards the liver.

IVC: Normal appearance

Pancreas: Fatty replacement without mass

Spleen: At 11.2 cm length.  Few calcifications question granulomata.

Right Kidney: Length: 12.8 cm. Normal cortical thickness. Slightly
increased cortical echogenicity. Small cyst 15 mm diameter. No
additional mass or hydronephrosis.

Left Kidney: Length: 12.2 cm. Normal cortical thickness. Slightly
increased cortical echogenicity. No mass or hydronephrosis.

Abdominal aorta: Normal caliber

Other findings: No free fluid
IMPRESSION: Probable fatty infiltration of liver.

Small hepatic and RIGHT renal cysts.

Suspected medical renal disease changes of both kidneys.

## 2022-04-18 DIAGNOSIS — H43811 Vitreous degeneration, right eye: Secondary | ICD-10-CM | POA: Diagnosis not present

## 2022-04-18 DIAGNOSIS — H353132 Nonexudative age-related macular degeneration, bilateral, intermediate dry stage: Secondary | ICD-10-CM | POA: Diagnosis not present

## 2022-04-18 DIAGNOSIS — H2513 Age-related nuclear cataract, bilateral: Secondary | ICD-10-CM | POA: Diagnosis not present

## 2022-04-18 DIAGNOSIS — H16223 Keratoconjunctivitis sicca, not specified as Sjogren's, bilateral: Secondary | ICD-10-CM | POA: Diagnosis not present

## 2022-05-23 ENCOUNTER — Inpatient Hospital Stay: Payer: Medicare Other | Attending: Hematology

## 2022-05-23 DIAGNOSIS — C9110 Chronic lymphocytic leukemia of B-cell type not having achieved remission: Secondary | ICD-10-CM | POA: Insufficient documentation

## 2022-05-23 LAB — CBC WITH DIFFERENTIAL/PLATELET
Abs Immature Granulocytes: 0 10*3/uL (ref 0.00–0.07)
Basophils Absolute: 0 10*3/uL (ref 0.0–0.1)
Basophils Relative: 0 %
Eosinophils Absolute: 0 10*3/uL (ref 0.0–0.5)
Eosinophils Relative: 0 %
HCT: 41.8 % (ref 39.0–52.0)
Hemoglobin: 13.8 g/dL (ref 13.0–17.0)
Lymphocytes Relative: 77 %
Lymphs Abs: 16.8 10*3/uL — ABNORMAL HIGH (ref 0.7–4.0)
MCH: 31.2 pg (ref 26.0–34.0)
MCHC: 33 g/dL (ref 30.0–36.0)
MCV: 94.6 fL (ref 80.0–100.0)
Monocytes Absolute: 0.4 10*3/uL (ref 0.1–1.0)
Monocytes Relative: 2 %
Neutro Abs: 4.6 10*3/uL (ref 1.7–7.7)
Neutrophils Relative %: 21 %
Platelets: 200 10*3/uL (ref 150–400)
RBC: 4.42 MIL/uL (ref 4.22–5.81)
RDW: 13 % (ref 11.5–15.5)
WBC: 21.8 10*3/uL — ABNORMAL HIGH (ref 4.0–10.5)
nRBC: 0 % (ref 0.0–0.2)

## 2022-05-23 LAB — COMPREHENSIVE METABOLIC PANEL
ALT: 17 U/L (ref 0–44)
AST: 22 U/L (ref 15–41)
Albumin: 4 g/dL (ref 3.5–5.0)
Alkaline Phosphatase: 56 U/L (ref 38–126)
Anion gap: 6 (ref 5–15)
BUN: 9 mg/dL (ref 8–23)
CO2: 27 mmol/L (ref 22–32)
Calcium: 9 mg/dL (ref 8.9–10.3)
Chloride: 107 mmol/L (ref 98–111)
Creatinine, Ser: 0.86 mg/dL (ref 0.61–1.24)
GFR, Estimated: >60 mL/min (ref >60)
Glucose, Bld: 93 mg/dL (ref 70–99)
Potassium: 3.9 mmol/L (ref 3.5–5.1)
Sodium: 140 mmol/L (ref 135–145)
Total Bilirubin: 1.3 mg/dL — ABNORMAL HIGH (ref 0.3–1.2)
Total Protein: 6.9 g/dL (ref 6.5–8.1)

## 2022-05-23 LAB — LACTATE DEHYDROGENASE: LDH: 139 U/L (ref 98–192)

## 2022-05-30 ENCOUNTER — Inpatient Hospital Stay: Payer: Medicare Other | Attending: Hematology | Admitting: Hematology

## 2022-05-30 VITALS — BP 154/89 | HR 78 | Temp 98.3°F | Resp 18 | Ht 70.0 in | Wt 162.4 lb

## 2022-05-30 DIAGNOSIS — F17290 Nicotine dependence, other tobacco product, uncomplicated: Secondary | ICD-10-CM | POA: Insufficient documentation

## 2022-05-30 DIAGNOSIS — Z803 Family history of malignant neoplasm of breast: Secondary | ICD-10-CM | POA: Diagnosis not present

## 2022-05-30 DIAGNOSIS — I10 Essential (primary) hypertension: Secondary | ICD-10-CM | POA: Diagnosis not present

## 2022-05-30 DIAGNOSIS — C9110 Chronic lymphocytic leukemia of B-cell type not having achieved remission: Secondary | ICD-10-CM | POA: Diagnosis not present

## 2022-05-30 DIAGNOSIS — Z8546 Personal history of malignant neoplasm of prostate: Secondary | ICD-10-CM | POA: Insufficient documentation

## 2022-05-30 NOTE — Progress Notes (Signed)
David Pham, [place] [id number]   CLINIC:  Medical Oncology/Hematology  PCP:  Kathyrn Drown, MD [address] [place] / [place] [address]  [phone]  REASON FOR VISIT:  Follow-up for CLL  PRIOR THERAPY: none  CURRENT THERAPY: surveillance  INTERVAL HISTORY:  David Pham, a 71 y.o. male, returns for follow-up of CLL.  He denies any fevers, night sweats or weight loss in the last 6 months.  Had 1 episode of sinusitis and possibly bronchitis in the last 6 months.  Arthritis of the back is stable.  REVIEW OF SYSTEMS:  Review of Systems  Constitutional:  Negative for appetite change, fatigue, fever and unexpected weight change.  HENT:   Negative for lump/mass.   Endocrine: Negative for hot flashes.  Musculoskeletal:  Positive for arthralgias (2/10).  All other systems reviewed and are negative.   PAST MEDICAL/SURGICAL HISTORY:  Past Medical History:  Diagnosis Date   Cancer ([place]) 09/2009   prostate   Hemorrhoids    Incontinence of feces    Rectal pain    Toenail fungus 03/28/2020   I would not recommend oral medication due to liver toxicity potential, treating topically   White coat hypertension    Past Surgical History:  Procedure Laterality Date   BIOPSY  11/17/2020   Procedure: BIOPSY;  Surgeon: Harvel Quale, MD;  [facility];  Service: Gastroenterology;;   COLONOSCOPY  2006 and 2012   COLONOSCOPY  08/04/2010   COLONOSCOPY WITH PROPOFOL N/A 11/17/2020   Procedure: COLONOSCOPY WITH PROPOFOL;  Surgeon: Harvel Quale, MD;  [facility];  Service: Gastroenterology;  Laterality: N/A;  830   ESOPHAGOGASTRODUODENOSCOPY (EGD) WITH PROPOFOL N/A 11/17/2020   Procedure: ESOPHAGOGASTRODUODENOSCOPY (EGD) WITH PROPOFOL;  Surgeon: Harvel Quale, MD;  [facility];  Service: Gastroenterology;  Laterality: N/A;   PROSTATECTOMY     2011    SOCIAL HISTORY:   Social History   Socioeconomic History   Marital status: Single    Spouse name: Not on file   Number of children: Not on file   Years of education: Not on file   Highest education level: Not on file  Occupational History   Not on file  Tobacco Use   Smoking status: Never   Smokeless tobacco: Current    Types: Snuff  Vaping Use   Vaping Use: Never used  Substance and Sexual Activity   Alcohol use: Never   Drug use: Never   Sexual activity: Not on file  Other Topics Concern   Not on file  Social History Narrative   Not on file   Social Determinants of Health   Financial Resource Strain: Not on file  Food Insecurity: Not on file  Transportation Needs: No Transportation Needs (10/08/2020)   PRAPARE - Hydrologist (Medical): No    Lack of Transportation (Non-Medical): No  Physical Activity: Inactive (10/08/2020)   Exercise Vital Sign    Days of Exercise per Week: 0 days    Minutes of Exercise per Session: 0 min  Stress: Not on file  Social Connections: Not on file  Intimate Partner Violence: Not At Risk (10/08/2020)   Humiliation, Afraid, Rape, and Kick questionnaire    Fear of Current or Ex-Partner: No    Emotionally Abused: No    Physically Abused: No    Sexually Abused: No    FAMILY HISTORY:  Family History  Problem Relation  Age of Onset   Cancer Sister        breast   Hypertension Brother    Diabetes Brother    Emphysema Mother    Asthma Father    Allergic rhinitis Father    Angioedema Neg Hx    Atopy Neg Hx    Eczema Neg Hx    Immunodeficiency Neg Hx    Urticaria Neg Hx     CURRENT MEDICATIONS:  Current Outpatient Medications  Medication Sig Dispense Refill   acetaminophen (TYLENOL) 500 MG tablet Take 1,000 mg by mouth at bedtime as needed (pain).     amLODipine (NORVASC) 2.5 MG tablet TAKE 1 TABLET BY MOUTH DAILY 90 tablet 1   Camphor-Menthol-Methyl Sal (SALONPAS) 3.07-29-08 % PTCH Place 1 patch onto the skin daily as  needed (back pain).     Cholecalciferol (VITAMIN D3) 50 MCG (2000 UT) TABS Take 2,000 Units by mouth in the morning.     ipratropium (ATROVENT) 0.06 % nasal spray Place 2 sprays into both nostrils 4 (four) times daily as needed for rhinitis. 15 mL 0   loratadine (CLARITIN) 10 MG tablet Take 10 mg by mouth daily.     omeprazole (PRILOSEC) 20 MG capsule TAKE 1 CAPSULE BY MOUTH DAILY 90 capsule 1   Polyethyl Glycol-Propyl Glycol (SYSTANE) 0.4-0.3 % SOLN Place 1-2 drops into both eyes 3 (three) times daily as needed (dry/irritated eyes.).     Polyvinyl Alcohol-Povidone (REFRESH OP) Place 1-2 drops into both eyes 3 (three) times daily as needed (dry/irritated eyes.).     vitamin B-12 (CYANOCOBALAMIN) 1000 MCG tablet Take 1,000 mcg by mouth in the morning.     No current facility-administered medications for this visit.    ALLERGIES:  Allergies  Allergen Reactions   Penicillins Other (See Comments)    Unknown reaction (possible rash)   Codeine Palpitations   Morphine And Related Palpitations    PHYSICAL EXAM:  Performance status (ECOG): 0 - Asymptomatic  There were no vitals filed for this visit. Wt Readings from Last 3 Encounters:  02/06/22 161 lb (73 kg)  11/23/21 161 lb 13.1 oz (73.4 kg)  09/07/21 160 lb 12.8 oz (72.9 kg)   Physical Exam Vitals reviewed.  Constitutional:      Appearance: Normal appearance.  Cardiovascular:     Rate and Rhythm: Normal rate and regular rhythm.     Pulses: Normal pulses.     Heart sounds: Normal heart sounds.  Pulmonary:     Effort: Pulmonary effort is normal.     Breath sounds: Normal breath sounds.  Abdominal:     Palpations: Abdomen is soft. There is no hepatomegaly, splenomegaly or mass.     Tenderness: There is no abdominal tenderness.  Musculoskeletal:     Right lower leg: No edema.     Left lower leg: No edema.  Lymphadenopathy:     Cervical: No cervical adenopathy.     Right cervical: No superficial cervical adenopathy.    Left  cervical: No superficial cervical adenopathy.     Upper Body:     Right upper body: No supraclavicular or axillary adenopathy.     Left upper body: No supraclavicular or axillary adenopathy.     Lower Body: No right inguinal adenopathy. No left inguinal adenopathy.  Neurological:     General: No focal deficit present.     Mental Status: He is alert and oriented to person, place, and time.  Psychiatric:        Mood and Affect: Mood  normal.        Behavior: Behavior normal.    LABORATORY DATA:  I have reviewed the labs as listed.     Latest Ref Rng & Units 05/23/2022    9:38 AM 11/16/2021    8:20 AM 05/10/2021   10:25 AM  CBC  WBC 4.0 - 10.5 K/uL 21.8  25.9  18.6   Hemoglobin 13.0 - 17.0 g/dL 13.8  13.6  14.1   Hematocrit 39.0 - 52.0 % 41.8  41.7  43.7   Platelets 150 - 400 K/uL 200  201  209       Latest Ref Rng & Units 05/23/2022    9:38 AM 01/31/2022    9:26 AM 11/16/2021    8:20 AM  CMP  Glucose 70 - 99 mg/dL 93  93  96   BUN 8 - 23 mg/dL '9  12  17   '$ Creatinine 0.61 - 1.24 mg/dL 0.86  0.87  0.87   Sodium 135 - 145 mmol/L 140  139  140   Potassium 3.5 - 5.1 mmol/L 3.9  4.0  4.0   Chloride 98 - 111 mmol/L 107  103  109   CO2 22 - 32 mmol/L '27  23  26   '$ Calcium 8.9 - 10.3 mg/dL 9.0  9.4  9.0   Total Protein 6.5 - 8.1 g/dL 6.9  6.9  6.9   Total Bilirubin 0.3 - 1.2 mg/dL 1.3  0.9  1.2   Alkaline Phos 38 - 126 U/L 56  72  65   AST 15 - 41 U/L '22  17  24   '$ ALT 0 - 44 U/L '17  16  20       '$ Component Value Date/Time   RBC 4.42 05/23/2022 0938   MCV 94.6 05/23/2022 0938   MCV 89 01/28/2021 1204   MCH 31.2 05/23/2022 0938   MCHC 33.0 05/23/2022 0938   RDW 13.0 05/23/2022 0938   RDW 12.5 01/28/2021 1204   LYMPHSABS 16.8 (H) 05/23/2022 0938   LYMPHSABS 16.5 (H) 01/28/2021 1204   MONOABS 0.4 05/23/2022 0938   EOSABS 0.0 05/23/2022 0938   EOSABS 0.1 01/28/2021 1204   BASOSABS 0.0 05/23/2022 0938   BASOSABS 0.1 01/28/2021 1204    DIAGNOSTIC IMAGING:  I have  independently reviewed the scans and discussed with the patient. No results found.   ASSESSMENT:  1.  Stage 0 CLL: -Lymphocyte predominant leukocytosis since 2019 -Leukocytosis first noted 11/17/2017 with total WBC 11.9 and absolute lymphocytes 5.9 -Most recent labs reviewed from 10/01/2020, WBC 16.5 with 74% lymphocytes, absolute lymphocytes 12.1. -No anemia or thrombocytopenia to date -No palpable lymphadenopathy, splenomegaly, hepatomegaly on exam -Abdominal ultrasound obtained 09/07/2020 was negative for splenomegaly - Flow cytometry on 10/11/2020 with monoclonal B-cell population, CD5 positive, CD19, CD20 positive consistent with CLL.   2.  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  Stage 0 CLL: - Physical exam without any evidence of palpable adenopathy or splenomegaly. - Labs showed normal LFTs, LDH.  White count is 21.8 and stable.  Predominantly lymphocytes on differential.  No anemia and thrombocytopenia. -  No indication for treatment.  RTC 6 months for follow-up with repeat labs and exam.  Orders placed this encounter:  No orders of the defined types were placed in this encounter.    Derek Jack, MD [place] [phone]

## 2022-05-30 NOTE — Patient Instructions (Addendum)
[place] at [facility] Discharge Instructions   You were seen and examined today by Dr. Delton Coombes.  He reviewed the results of your lab work which are normal/stable.   We will see you back in 6 months. We will repeat lab work prior to your next visit.    Thank you for choosing [place] at [facility] to provide your oncology and hematology care.  To afford each patient quality time with our provider, please arrive at least 15 minutes before your scheduled appointment time.   If you have a lab appointment with the [facility] please come in thru the Main Entrance and check in at the main information desk.  You need to re-schedule your appointment should you arrive 10 or more minutes late.  We strive to give you quality time with our providers, and arriving late affects you and other patients whose appointments are after yours.  Also, if you no show three or more times for appointments you may be dismissed from the clinic at the providers discretion.     Again, thank you for choosing [facility].  Our hope is that these requests will decrease the amount of time that you wait before being seen by our physicians.       _____________________________________________________________  Should you have questions after your visit to [facility], please contact our office at [phone] and follow the prompts.  Our office hours are 8:00 a.m. and 4:30 p.m. Monday - Friday.  Please note that voicemails left after 4:00 p.m. may not be returned until the following business day.  We are closed weekends and major holidays.  You do have access to a nurse 24-7, just call the main number to the clinic [phone] and do not press any options, hold on the line and a nurse will answer the phone.    For prescription refill requests, have your pharmacy contact our office and allow 72 hours.    Due to Covid, you will need to wear a mask  upon entering the hospital. If you do not have a mask, a mask will be given to you at the Main Entrance upon arrival. For doctor visits, patients may have 1 support person age 36 or older with them. For treatment visits, patients can not have anyone with them due to social distancing guidelines and our immunocompromised population.

## 2022-06-08 ENCOUNTER — Other Ambulatory Visit: Payer: Self-pay

## 2022-06-08 ENCOUNTER — Other Ambulatory Visit: Payer: Self-pay | Admitting: Family Medicine

## 2022-06-08 DIAGNOSIS — J301 Allergic rhinitis due to pollen: Secondary | ICD-10-CM

## 2022-06-08 MED ORDER — FLUTICASONE PROPIONATE 50 MCG/ACT NA SUSP: 2.0000 | Freq: Every day | NASAL | 2 refills | Status: DC

## 2022-06-09 ENCOUNTER — Ambulatory Visit: Payer: Medicare Other | Admitting: Family Medicine

## 2022-06-24 ENCOUNTER — Other Ambulatory Visit: Payer: Self-pay | Admitting: Family Medicine

## 2022-07-04 DIAGNOSIS — E785 Hyperlipidemia, unspecified: Secondary | ICD-10-CM | POA: Diagnosis not present

## 2022-07-05 LAB — LIPID PANEL
Chol/HDL Ratio: 3.5 ratio (ref 0.0–5.0)
Cholesterol, Total: 166 mg/dL (ref 100–199)
HDL: 47 mg/dL (ref >39)
LDL Chol Calc (NIH): 101 mg/dL — ABNORMAL HIGH (ref 0–99)
Triglycerides: 97 mg/dL (ref 0–149)
VLDL Cholesterol Cal: 18 mg/dL (ref 5–40)

## 2022-07-10 ENCOUNTER — Ambulatory Visit (INDEPENDENT_AMBULATORY_CARE_PROVIDER_SITE_OTHER): Payer: Medicare Other | Admitting: Family Medicine

## 2022-07-10 VITALS — BP 112/82 | HR 90 | Temp 98.6°F | Ht 70.0 in | Wt 160.0 lb

## 2022-07-10 DIAGNOSIS — M7121 Synovial cyst of popliteal space [Baker], right knee: Secondary | ICD-10-CM

## 2022-07-10 DIAGNOSIS — I10 Essential (primary) hypertension: Secondary | ICD-10-CM

## 2022-07-10 DIAGNOSIS — E785 Hyperlipidemia, unspecified: Secondary | ICD-10-CM | POA: Diagnosis not present

## 2022-07-10 DIAGNOSIS — M5431 Sciatica, right side: Secondary | ICD-10-CM

## 2022-07-10 DIAGNOSIS — B351 Tinea unguium: Secondary | ICD-10-CM

## 2022-07-10 MED ORDER — AMLODIPINE BESYLATE 2.5 MG PO TABS: 2.5000 mg | ORAL_TABLET | Freq: Every day | ORAL | 1 refills | Status: DC

## 2022-07-10 MED ORDER — TADALAFIL 5 MG PO TABS: 5.0000 mg | ORAL_TABLET | Freq: Every day | ORAL | 1 refills | Status: DC

## 2022-07-10 NOTE — Progress Notes (Signed)
   Subjective:    Patient ID: David Pham, male    DOB: 06-27-1951, 71 y.o.   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 He also has toenail fungus that he is concerned about he would like to talk about treatments available He also has hypertension takes blood pressure medicine regular basis In addition to this he has a cyst in the right lateral aspect of the knee previous ultrasound was negative it has not changed over the past 5 years  Review of Systems     Objective:   Physical Exam General-in no acute distress Eyes-no discharge Lungs-respiratory rate normal, CTA CV-no murmurs,RRR Extremities skin warm dry no edema Neuro grossly normal Behavior normal, alert  The area on the right leg is consistent with what it has been over the past several years appears to be a cystic area       Assessment & Plan:  Lumbar pain with sciatica down the right leg exercises shown.  Physical therapy referral.  Follow-up if progressive troubles or worse.  Follow-up with the back sooner if any problems Also discussion held with patient's health could try injections or medications patient is concerned about having with the medicines states he is already tried injections and it did not help  Hyperlipidemia decent control with lipids healthy diet recommended.  Blood pressure good control continue current measures  Toenail fungus I do not recommend Lamisil for this patient  Right leg cystic area recommend close follow-up if it gets worse gave patient offered to see specialist he defers currently

## 2022-07-11 ENCOUNTER — Other Ambulatory Visit: Payer: Self-pay

## 2022-07-11 DIAGNOSIS — M5431 Sciatica, right side: Secondary | ICD-10-CM

## 2022-07-11 NOTE — Progress Notes (Signed)
07/11/22- referral placed in Epic

## 2022-07-11 NOTE — Addendum Note (Signed)
Addended by: Vicente Males on: 07/11/2022 08:52 AM   Modules accepted: Orders

## 2022-07-13 ENCOUNTER — Telehealth: Payer: Self-pay | Admitting: Family Medicine

## 2022-07-13 NOTE — Telephone Encounter (Signed)
Nurses When the patient was last in the office he requested Cialis 5 mg 1 daily  Please let him know that we received a notice from his insurance company that this is not covered at all under his plan  If the reason for this medicine was erectile dysfunction his other option would be generic Viagra but I can already tell him that this will not be covered either he would have to pay for it as a generic through a pharmacy-typically [place] [facility] has the best prices locally  Whether or not he utilizes that medicine would be his option but as for the Cialis it is not covered

## 2022-07-14 NOTE — Telephone Encounter (Signed)
Patient notified and stated he was just going to hold off on it for now and let us know if he changes his mind.

## 2022-07-18 ENCOUNTER — Telehealth: Payer: Self-pay

## 2022-07-18 NOTE — Telephone Encounter (Signed)
The patient has a cyst on that aspect of his knee recommend ultrasound of that region (Obviously you will need to explain to the patient that ultrasound will have to be scheduled and will be done when radiology can work him in so he is not expecting it to be done today)

## 2022-07-18 NOTE — Telephone Encounter (Signed)
Patient called this morning about his right still bothering him ,asks about possible orders for an ultrasound, pain is located in front of the right knee and right side of the R knee, please advise

## 2022-07-19 ENCOUNTER — Other Ambulatory Visit: Payer: Self-pay

## 2022-07-19 ENCOUNTER — Ambulatory Visit: Payer: Medicare Other | Attending: Family Medicine

## 2022-07-19 DIAGNOSIS — M5431 Sciatica, right side: Secondary | ICD-10-CM | POA: Diagnosis not present

## 2022-07-19 DIAGNOSIS — M5416 Radiculopathy, lumbar region: Secondary | ICD-10-CM | POA: Insufficient documentation

## 2022-07-19 NOTE — Therapy (Signed)
OUTPATIENT PHYSICAL THERAPY THORACOLUMBAR EVALUATION   Patient Name: David Pham MRN: [id number] DOB:[birth year], 71 y.o., male Today's Date: 07/19/2022  END OF SESSION:  PT End of Session - 07/19/22 0810     Visit Number 1    Number of Visits 8    Date for PT Re-Evaluation 09/15/22    PT Start Time 0819    PT Stop Time 0900    PT Time Calculation (min) 41 min    Activity Tolerance Patient tolerated treatment well    Behavior During Therapy [facility] for tasks assessed/performed             Past Medical History:  Diagnosis Date   Cancer ([place]) 09/2009   prostate   Hemorrhoids    Incontinence of feces    Rectal pain    Toenail fungus 03/28/2020   I would not recommend oral medication due to liver toxicity potential, treating topically   White coat hypertension    Past Surgical History:  Procedure Laterality Date   BIOPSY  11/17/2020   Procedure: BIOPSY;  Surgeon: Harvel Quale, MD;  [facility];  Service: Gastroenterology;;   COLONOSCOPY  2006 and 2012   COLONOSCOPY  08/04/2010   COLONOSCOPY WITH PROPOFOL N/A 11/17/2020   Procedure: COLONOSCOPY WITH PROPOFOL;  Surgeon: Harvel Quale, MD;  [facility];  Service: Gastroenterology;  Laterality: N/A;  830   ESOPHAGOGASTRODUODENOSCOPY (EGD) WITH PROPOFOL N/A 11/17/2020   Procedure: ESOPHAGOGASTRODUODENOSCOPY (EGD) WITH PROPOFOL;  Surgeon: Harvel Quale, MD;  [facility];  Service: Gastroenterology;  Laterality: N/A;   PROSTATECTOMY     2011   Patient Active Problem List   Diagnosis Date Noted   Viral respiratory infection 09/08/2021   Hyperlipidemia 01/28/2021   Fatty liver 10/01/2020   Toenail fungus 03/28/2020   Pain in left shoulder 02/18/2020   Seasonal and perennial allergic rhinitis 11/28/2019   Insect sting allergy 11/28/2019   Chronic throat clearing 09/29/2019   Mass of right knee 02/21/2017   HTN (hypertension) 11/06/2016   Left shoulder  tendinitis 11/06/2016   Spondylolisthesis at L5-S1 level 09/23/2015   History of prostatectomy 05/11/2014   COLONIC POLYPS 06/01/2008   Allergic rhinitis 06/01/2008   ESOPHAGEAL REFLUX 11/29/2007   REFERRING PROVIDER: Kathyrn Drown, MD   REFERRING DIAG: Sciatica, right side   Rationale for Evaluation and Treatment: Rehabilitation  THERAPY DIAG:  Radiculopathy, lumbar region  ONSET DATE: years ago with a flare up "a few months ago"   SUBJECTIVE:  SUBJECTIVE STATEMENT: Patient reports that he has a ruptured disc in his back for years, but it never really bothered him. However, it got a lot worse a few months ago while he was mowing his yard. He began having pain radiate down his right leg. He notes that his back had not been bothering him for the last few days, but he woke up this morning with it hurting some and stiff.   PERTINENT HISTORY:  HTN, history of prostate cancer, and left shoulder pain  PAIN:  Are you having pain? Yes: NPRS scale: 7-8/10 Pain location: low back and right leg Pain description: ache,  Aggravating factors: moving his right leg, squatting, kneeling, twisting, standing and walking (1 mile at most) Relieving factors: sitting still, topical cream   PRECAUTIONS: None  WEIGHT BEARING RESTRICTIONS: No  FALLS:  Has patient fallen in last 6 months? No  LIVING ENVIRONMENT: Lives in: House/apartment Stairs: Yes: External: 2-3 steps;    OCCUPATION: truck driver; part time;   PLOF: Independent  PATIENT GOALS: reduced pain, play golf, and be able to do yard work, and stand and walk for longer  NEXT MD VISIT: 10/13/22  OBJECTIVE:   PATIENT SURVEYS:  FOTO 40.31  SCREENING FOR RED FLAGS: Bowel or bladder incontinence: No Spinal tumors: No Cauda equina syndrome:  No Compression fracture: No Abdominal aneurysm: No  COGNITION: Overall cognitive status: Within functional limits for tasks assessed     SENSATION: Patient reports mild numbness in his right lateral hip   POSTURE: rounded shoulders, forward head, and flexed trunk   PALPATION: No significant tenderness to palpation  LUMBAR JOINT MOBILITY:  Hypomobile and nonpainful throughout  LUMBAR ROM:   AROM eval  Flexion 48  Extension 6  Right lateral flexion 25% limited  Left lateral flexion 25% limited  Right rotation 25% limited; "a little pulling"  Left rotation 50% limited; "a little pulling"    (Blank rows = not tested)  LOWER EXTREMITY ROM:  WFL for activities assessed  LOWER EXTREMITY MMT:    MMT Right eval Left eval  Hip flexion 4-/5; "pulling" in lower leg 4/5  Hip extension    Hip abduction    Hip adduction    Hip internal rotation    Hip external rotation    Knee flexion 4/5 4+/5  Knee extension 4-/5 4+/5  Ankle dorsiflexion 4+/5 4+/5  Ankle plantarflexion    Ankle inversion    Ankle eversion     (Blank rows = not tested)  LUMBAR SPECIAL TESTS:  Crossed straight leg raise: positive  FUNCTIONAL TESTS:  Squatting: requires significant UE support on his lower extremities  GAIT: Assistive device utilized: None Level of assistance: Complete Independence Comments: no significant gait deviations  TODAY'S TREATMENT:                                                                                                                              DATE:     PATIENT  EDUCATION:  Education details: POC, healing, prognosis, and goals for therapy Person educated: Patient Education method: Explanation Education comprehension: verbalized understanding  HOME EXERCISE PROGRAM:   ASSESSMENT:  CLINICAL IMPRESSION: Patient is a 71 y.o. male who was seen today for physical therapy evaluation and treatment for chronic low back pain. He presented with moderate symptom  severity, but low irritability as none of today's assessments significantly reproduced his familiar symptoms. Recommend that he continue with skilled physical therapy to address his impairments to return to his prior level of function.   OBJECTIVE IMPAIRMENTS: decreased activity tolerance, difficulty walking, decreased ROM, decreased strength, hypomobility, postural dysfunction, and pain.   ACTIVITY LIMITATIONS: lifting, bending, standing, squatting, and locomotion level  PARTICIPATION LIMITATIONS: meal prep, cleaning, laundry, shopping, community activity, and yard work  PERSONAL FACTORS: Time since onset of injury/illness/exacerbation and 3+ comorbidities: HTN, history of prostate cancer, and left shoulder pain  are also affecting patient's functional outcome.   REHAB POTENTIAL: Fair    CLINICAL DECISION MAKING: Evolving/moderate complexity  EVALUATION COMPLEXITY: Moderate   GOALS: Goals reviewed with patient? Yes  LONG TERM GOALS: Target date: 08/16/22  Patient will be independent with his HEP.  Baseline:  Goal status: INITIAL  2.  Patient will be able to complete his daily activities without his familiar pain exceeding 5/10. Baseline:  Goal status: INITIAL  3.  Patient will be able to stand and walk for at least 30 minutes without being limited by his familiar symptoms.  Baseline:  Goal status: INITIAL  4.  Patient will be able to demonstrate proper lifting mechanics for improved function with his daily activities.  Baseline:  Goal status: INITIAL  PLAN:  PT FREQUENCY: 1-2x/week  PT DURATION: 4 weeks  PLANNED INTERVENTIONS: Therapeutic exercises, Therapeutic activity, Neuromuscular re-education, Patient/Family education, Self Care, Joint mobilization, Electrical stimulation, Spinal mobilization, Cryotherapy, Moist heat, Traction, Manual therapy, and Re-evaluation.  PLAN FOR NEXT SESSION: nustep, lifting mechanics, lower extremity strengthening, and modalities as  needed   Darlin Coco, PT 07/19/2022, 12:18 PM

## 2022-07-20 ENCOUNTER — Other Ambulatory Visit: Payer: Self-pay

## 2022-07-20 DIAGNOSIS — G8929 Other chronic pain: Secondary | ICD-10-CM

## 2022-07-20 NOTE — Telephone Encounter (Signed)
Patient notified of U/S scheduled at [facility] 07/25/22 at 1:30pm. Patient verbalized understanding.

## 2022-07-25 ENCOUNTER — Ambulatory Visit (HOSPITAL_COMMUNITY)
Admission: RE | Admit: 2022-07-25 | Discharge: 2022-07-25 | Disposition: A | Discharge status: Home / Self Care | Payer: Medicare Other | Source: Ambulatory Visit | Attending: Family Medicine | Admitting: Family Medicine

## 2022-07-25 DIAGNOSIS — M7121 Synovial cyst of popliteal space [Baker], right knee: Secondary | ICD-10-CM | POA: Diagnosis not present

## 2022-07-25 DIAGNOSIS — M25561 Pain in right knee: Secondary | ICD-10-CM | POA: Insufficient documentation

## 2022-07-25 DIAGNOSIS — G8929 Other chronic pain: Secondary | ICD-10-CM | POA: Diagnosis not present

## 2022-07-27 ENCOUNTER — Ambulatory Visit: Payer: Medicare Other | Attending: Family Medicine

## 2022-07-27 DIAGNOSIS — M5416 Radiculopathy, lumbar region: Secondary | ICD-10-CM | POA: Insufficient documentation

## 2022-07-27 NOTE — Therapy (Signed)
OUTPATIENT PHYSICAL THERAPY THORACOLUMBAR TREATMENT   Patient Name: David Pham MRN: [id number] DOB:1950-12-15, 72 y.o., male Today's Date: 07/27/2022  END OF SESSION:  PT End of Session - 07/27/22 1639     Visit Number 2    Number of Visits 8    Date for PT Re-Evaluation 09/15/22    PT Start Time 4650    PT Stop Time 1603    PT Time Calculation (min) 48 min    Activity Tolerance Patient tolerated treatment well    Behavior During Therapy [facility] for tasks assessed/performed              Past Medical History:  Diagnosis Date   Cancer ([place]) 09/2009   prostate   Hemorrhoids    Incontinence of feces    Rectal pain    Toenail fungus 03/28/2020   I would not recommend oral medication due to liver toxicity potential, treating topically   White coat hypertension    Past Surgical History:  Procedure Laterality Date   BIOPSY  11/17/2020   Procedure: BIOPSY;  Surgeon: Harvel Quale, MD;  [facility];  Service: Gastroenterology;;   COLONOSCOPY  2006 and 2012   COLONOSCOPY  08/04/2010   COLONOSCOPY WITH PROPOFOL N/A 11/17/2020   Procedure: COLONOSCOPY WITH PROPOFOL;  Surgeon: Harvel Quale, MD;  [facility];  Service: Gastroenterology;  Laterality: N/A;  830   ESOPHAGOGASTRODUODENOSCOPY (EGD) WITH PROPOFOL N/A 11/17/2020   Procedure: ESOPHAGOGASTRODUODENOSCOPY (EGD) WITH PROPOFOL;  Surgeon: Harvel Quale, MD;  [facility];  Service: Gastroenterology;  Laterality: N/A;   PROSTATECTOMY     2011   Patient Active Problem List   Diagnosis Date Noted   Viral respiratory infection 09/08/2021   Hyperlipidemia 01/28/2021   Fatty liver 10/01/2020   Toenail fungus 03/28/2020   Pain in left shoulder 02/18/2020   Seasonal and perennial allergic rhinitis 11/28/2019   Insect sting allergy 11/28/2019   Chronic throat clearing 09/29/2019   Mass of right knee 02/21/2017   HTN (hypertension) 11/06/2016   Left shoulder  tendinitis 11/06/2016   Spondylolisthesis at L5-S1 level 09/23/2015   History of prostatectomy 05/11/2014   COLONIC POLYPS 06/01/2008   Allergic rhinitis 06/01/2008   ESOPHAGEAL REFLUX 11/29/2007   REFERRING PROVIDER: Kathyrn Drown, MD   REFERRING DIAG: Sciatica, right side   Rationale for Evaluation and Treatment: Rehabilitation  THERAPY DIAG:  Radiculopathy, lumbar region  ONSET DATE: years ago with a flare up "a few months ago"   SUBJECTIVE:  SUBJECTIVE STATEMENT: Patient reports that his back was bothering him really bad about 2 days ago at work. He had a "catch" when bending over at work.   PERTINENT HISTORY:  HTN, history of prostate cancer, and left shoulder pain  PAIN:  Are you having pain? Yes: NPRS scale: none provided/10 Pain location: low back and right leg Pain description: ache,  Aggravating factors: moving his right leg, squatting, kneeling, twisting, standing and walking (1 mile at most) Relieving factors: sitting still, topical cream   PRECAUTIONS: None  WEIGHT BEARING RESTRICTIONS: No  FALLS:  Has patient fallen in last 6 months? No  LIVING ENVIRONMENT: Lives in: House/apartment Stairs: Yes: External: 2-3 steps;    OCCUPATION: truck driver; part time;   PLOF: Independent  PATIENT GOALS: reduced pain, play golf, and be able to do yard work, and stand and walk for longer  NEXT MD VISIT: 10/13/22  OBJECTIVE: all objective measures were assessed at his initial evaluation on 07/19/22 unless otherwise noted  PATIENT SURVEYS:  FOTO 40.31  SCREENING FOR RED FLAGS: Bowel or bladder incontinence: No Spinal tumors: No Cauda equina syndrome: No Compression fracture: No Abdominal aneurysm: No  COGNITION: Overall cognitive status: Within functional limits for tasks  assessed     SENSATION: Patient reports mild numbness in his right lateral hip   POSTURE: rounded shoulders, forward head, and flexed trunk   PALPATION: No significant tenderness to palpation  LUMBAR JOINT MOBILITY:  Hypomobile and nonpainful throughout  LUMBAR ROM:   AROM eval  Flexion 48  Extension 6  Right lateral flexion 25% limited  Left lateral flexion 25% limited  Right rotation 25% limited; "a little pulling"  Left rotation 50% limited; "a little pulling"    (Blank rows = not tested)  LOWER EXTREMITY ROM:  WFL for activities assessed  LOWER EXTREMITY MMT:    MMT Right eval Left eval  Hip flexion 4-/5; "pulling" in lower leg 4/5  Hip extension    Hip abduction    Hip adduction    Hip internal rotation    Hip external rotation    Knee flexion 4/5 4+/5  Knee extension 4-/5 4+/5  Ankle dorsiflexion 4+/5 4+/5  Ankle plantarflexion    Ankle inversion    Ankle eversion     (Blank rows = not tested)  LUMBAR SPECIAL TESTS:  Crossed straight leg raise: positive  FUNCTIONAL TESTS:  Squatting: requires significant UE support on his lower extremities  GAIT: Assistive device utilized: None Level of assistance: Complete Independence Comments: no significant gait deviations  TODAY'S TREATMENT:                                                                                                                              DATE:                                     1/4  EXERCISE LOG  Exercise Repetitions and Resistance Comments  Nustep L1 x 10 minutes   Rocker board  5 minutes   Standing HS stretch  On 6" step; 4 x 30 seconds each    Isometric ball press 3 minutes w/ 5 second hold         Blank cell = exercise not performed today    PATIENT EDUCATION:  Education details: anatomy, healing, benefits of therapy.  Person educated: Patient Education method: Explanation Education comprehension: verbalized understanding  HOME EXERCISE  PROGRAM:   ASSESSMENT:  CLINICAL IMPRESSION: Patient was introduced to multiple new interventions for improved lower extremity mobility and strength with moderate difficulty and fatigue. He experienced a moderate increase in lower extremity soreness with today's interventions. However, this did not inhibit his ability to complete these activities. He was significantly educated on lumbar anatomy and how physical therapy and exercise can help reduce his pain while improving his function. He reported feeling a little sore upon the conclusion of treatment. He continues to require skilled physical therapy to address his remaining impairments to return to his prior level of function.   OBJECTIVE IMPAIRMENTS: decreased activity tolerance, difficulty walking, decreased ROM, decreased strength, hypomobility, postural dysfunction, and pain.   ACTIVITY LIMITATIONS: lifting, bending, standing, squatting, and locomotion level  PARTICIPATION LIMITATIONS: meal prep, cleaning, laundry, shopping, community activity, and yard work  PERSONAL FACTORS: Time since onset of injury/illness/exacerbation and 3+ comorbidities: HTN, history of prostate cancer, and left shoulder pain  are also affecting patient's functional outcome.   REHAB POTENTIAL: Fair    CLINICAL DECISION MAKING: Evolving/moderate complexity  EVALUATION COMPLEXITY: Moderate   GOALS: Goals reviewed with patient? Yes  LONG TERM GOALS: Target date: 08/16/22  Patient will be independent with his HEP.  Baseline:  Goal status: INITIAL  2.  Patient will be able to complete his daily activities without his familiar pain exceeding 5/10. Baseline:  Goal status: INITIAL  3.  Patient will be able to stand and walk for at least 30 minutes without being limited by his familiar symptoms.  Baseline:  Goal status: INITIAL  4.  Patient will be able to demonstrate proper lifting mechanics for improved function with his daily activities.  Baseline:   Goal status: INITIAL  PLAN:  PT FREQUENCY: 1-2x/week  PT DURATION: 4 weeks  PLANNED INTERVENTIONS: Therapeutic exercises, Therapeutic activity, Neuromuscular re-education, Patient/Family education, Self Care, Joint mobilization, Electrical stimulation, Spinal mobilization, Cryotherapy, Moist heat, Traction, Manual therapy, and Re-evaluation.  PLAN FOR NEXT SESSION: nustep, lifting mechanics, lower extremity strengthening, and modalities as needed   Darlin Coco, PT 07/27/2022, 4:40 PM

## 2022-08-01 ENCOUNTER — Ambulatory Visit: Payer: Medicare Other

## 2022-08-01 DIAGNOSIS — M5416 Radiculopathy, lumbar region: Secondary | ICD-10-CM

## 2022-08-01 NOTE — Therapy (Signed)
OUTPATIENT PHYSICAL THERAPY THORACOLUMBAR TREATMENT   Patient Name: David Pham MRN: [id number] DOB:Feb 14, 1951, 72 y.o., male Today's Date: 08/01/2022  END OF SESSION:  PT End of Session - 08/01/22 1311     Visit Number 3    Number of Visits 8    Date for PT Re-Evaluation 09/15/22    PT Start Time 1300    PT Stop Time 1345    PT Time Calculation (min) 45 min    Activity Tolerance Patient tolerated treatment well    Behavior During Therapy [facility] for tasks assessed/performed              Past Medical History:  Diagnosis Date   Cancer ([place]) 09/2009   prostate   Hemorrhoids    Incontinence of feces    Rectal pain    Toenail fungus 03/28/2020   I would not recommend oral medication due to liver toxicity potential, treating topically   White coat hypertension    Past Surgical History:  Procedure Laterality Date   BIOPSY  11/17/2020   Procedure: BIOPSY;  Surgeon: Harvel Quale, MD;  [facility];  Service: Gastroenterology;;   COLONOSCOPY  2006 and 2012   COLONOSCOPY  08/04/2010   COLONOSCOPY WITH PROPOFOL N/A 11/17/2020   Procedure: COLONOSCOPY WITH PROPOFOL;  Surgeon: Harvel Quale, MD;  [facility]: [facility];  Service: Gastroenterology;  Laterality: N/A;  830   ESOPHAGOGASTRODUODENOSCOPY (EGD) WITH PROPOFOL N/A 11/17/2020   Procedure: ESOPHAGOGASTRODUODENOSCOPY (EGD) WITH PROPOFOL;  Surgeon: Harvel Quale, MD;  [facility]: [facility];  Service: Gastroenterology;  Laterality: N/A;   PROSTATECTOMY     2011   Patient Active Problem List   Diagnosis Date Noted   Viral respiratory infection 09/08/2021   Hyperlipidemia 01/28/2021   Fatty liver 10/01/2020   Toenail fungus 03/28/2020   Pain in left shoulder 02/18/2020   Seasonal and perennial allergic rhinitis 11/28/2019   Insect sting allergy 11/28/2019   Chronic throat clearing 09/29/2019   Mass of right knee 02/21/2017   HTN (hypertension) 11/06/2016   Left shoulder  tendinitis 11/06/2016   Spondylolisthesis at L5-S1 level 09/23/2015   History of prostatectomy 05/11/2014   COLONIC POLYPS 06/01/2008   Allergic rhinitis 06/01/2008   ESOPHAGEAL REFLUX 11/29/2007   REFERRING PROVIDER: Kathyrn Drown, MD   REFERRING DIAG: Sciatica, right side   Rationale for Evaluation and Treatment: Rehabilitation  THERAPY DIAG:  Radiculopathy, lumbar region  ONSET DATE: years ago with a flare up "a few months ago"   SUBJECTIVE:  SUBJECTIVE STATEMENT: Patient reports that he was really sore over the weekend.   PERTINENT HISTORY:  HTN, history of prostate cancer, and left shoulder pain  PAIN:  Are you having pain? Yes: NPRS scale: none provided/10 Pain location: low back and right leg Pain description: ache,  Aggravating factors: moving his right leg, squatting, kneeling, twisting, standing and walking (1 mile at most) Relieving factors: sitting still, topical cream   PRECAUTIONS: None  WEIGHT BEARING RESTRICTIONS: No  FALLS:  Has patient fallen in last 6 months? No  LIVING ENVIRONMENT: Lives in: House/apartment Stairs: Yes: External: 2-3 steps;    OCCUPATION: truck driver; part time;   PLOF: Independent  PATIENT GOALS: reduced pain, play golf, and be able to do yard work, and stand and walk for longer  NEXT MD VISIT: 10/13/22  OBJECTIVE: all objective measures were assessed at his initial evaluation on 07/19/22 unless otherwise noted  PATIENT SURVEYS:  FOTO 40.31  SCREENING FOR RED FLAGS: Bowel or bladder incontinence: No Spinal tumors: No Cauda equina syndrome: No Compression fracture: No Abdominal aneurysm: No  COGNITION: Overall cognitive status: Within functional limits for tasks assessed     SENSATION: Patient reports mild numbness in his right  lateral hip   POSTURE: rounded shoulders, forward head, and flexed trunk   PALPATION: No significant tenderness to palpation  LUMBAR JOINT MOBILITY:  Hypomobile and nonpainful throughout  LUMBAR ROM:   AROM eval  Flexion 48  Extension 6  Right lateral flexion 25% limited  Left lateral flexion 25% limited  Right rotation 25% limited; "a little pulling"  Left rotation 50% limited; "a little pulling"    (Blank rows = not tested)  LOWER EXTREMITY ROM:  WFL for activities assessed  LOWER EXTREMITY MMT:    MMT Right eval Left eval  Hip flexion 4-/5; "pulling" in lower leg 4/5  Hip extension    Hip abduction    Hip adduction    Hip internal rotation    Hip external rotation    Knee flexion 4/5 4+/5  Knee extension 4-/5 4+/5  Ankle dorsiflexion 4+/5 4+/5  Ankle plantarflexion    Ankle inversion    Ankle eversion     (Blank rows = not tested)  LUMBAR SPECIAL TESTS:  Crossed straight leg raise: positive  FUNCTIONAL TESTS:  Squatting: requires significant UE support on his lower extremities  GAIT: Assistive device utilized: None Level of assistance: Complete Independence Comments: no significant gait deviations  TODAY'S TREATMENT:                                                                                                                              DATE:                                     1/9 EXERCISE LOG  Exercise Repetitions and Resistance Comments  Nustep  L4 x 15 minutes  Ball roll out  3 minutes   Lower trunk rotation 2 minutes Added to [employer] with march  20 reps  Added to HEP   SLR  30 reps each  Added to [employer]  5 minutes    Resisted pull down  Blue XTS x 2 minutes   Resisted wood chop  Blue XTS x 20 reps each     Blank cell = exercise not performed today                                    1/4 EXERCISE LOG  Exercise Repetitions and Resistance Comments  Nustep L1 x 10 minutes   Rocker board  5 minutes   Standing HS stretch   On 6" step; 4 x 30 seconds each    Isometric ball press 3 minutes w/ 5 second hold         Blank cell = exercise not performed today    PATIENT EDUCATION:  Education details: anatomy, healing, benefits of therapy.  Person educated: Patient Education method: Explanation Education comprehension: verbalized understanding  HOME EXERCISE PROGRAM:   ASSESSMENT:  CLINICAL IMPRESSION: Patient was introduced to multiple new interventions with moderate difficulty and fatigue. He required minimal cueing with today's new interventions for proper positioning to avoid compensatory movements. He experienced a mild increase in his familiar discomfort with right rotation with wood chops. However, this did not limit his ability to complete this intervention. He reported feeling sore upon the conclusion of treatment. He continues to require skilled physical therapy to address his remaining impairments to return to his prior level of function.   OBJECTIVE IMPAIRMENTS: decreased activity tolerance, difficulty walking, decreased ROM, decreased strength, hypomobility, postural dysfunction, and pain.   ACTIVITY LIMITATIONS: lifting, bending, standing, squatting, and locomotion level  PARTICIPATION LIMITATIONS: meal prep, cleaning, laundry, shopping, community activity, and yard work  PERSONAL FACTORS: Time since onset of injury/illness/exacerbation and 3+ comorbidities: HTN, history of prostate cancer, and left shoulder pain  are also affecting patient's functional outcome.   REHAB POTENTIAL: Fair    CLINICAL DECISION MAKING: Evolving/moderate complexity  EVALUATION COMPLEXITY: Moderate   GOALS: Goals reviewed with patient? Yes  LONG TERM GOALS: Target date: 08/16/22  Patient will be independent with his HEP.  Baseline:  Goal status: INITIAL  2.  Patient will be able to complete his daily activities without his familiar pain exceeding 5/10. Baseline:  Goal status: INITIAL  3.  Patient will be  able to stand and walk for at least 30 minutes without being limited by his familiar symptoms.  Baseline:  Goal status: INITIAL  4.  Patient will be able to demonstrate proper lifting mechanics for improved function with his daily activities.  Baseline:  Goal status: INITIAL  PLAN:  PT FREQUENCY: 1-2x/week  PT DURATION: 4 weeks  PLANNED INTERVENTIONS: Therapeutic exercises, Therapeutic activity, Neuromuscular re-education, Patient/Family education, Self Care, Joint mobilization, Electrical stimulation, Spinal mobilization, Cryotherapy, Moist heat, Traction, Manual therapy, and Re-evaluation.  PLAN FOR NEXT SESSION: nustep, lifting mechanics, lower extremity strengthening, and modalities as needed   Darlin Coco, PT 08/01/2022, 3:24 PM

## 2022-08-08 ENCOUNTER — Ambulatory Visit: Payer: Medicare Other

## 2022-08-08 DIAGNOSIS — M5416 Radiculopathy, lumbar region: Secondary | ICD-10-CM

## 2022-08-08 NOTE — Therapy (Signed)
OUTPATIENT PHYSICAL THERAPY THORACOLUMBAR TREATMENT   Patient Name: David Pham MRN: [id number] DOB:24-Jan-1951, 72 y.o., male Today's Date: 08/08/2022  END OF SESSION:  PT End of Session - 08/08/22 1437     Visit Number 4    Number of Visits 8    Date for PT Re-Evaluation 09/15/22    PT Start Time 5859    PT Stop Time 1516    PT Time Calculation (min) 42 min    Activity Tolerance Patient tolerated treatment well    Behavior During Therapy [facility] for tasks assessed/performed              Past Medical History:  Diagnosis Date   Cancer ([place]) 09/2009   prostate   Hemorrhoids    Incontinence of feces    Rectal pain    Toenail fungus 03/28/2020   I would not recommend oral medication due to liver toxicity potential, treating topically   White coat hypertension    Past Surgical History:  Procedure Laterality Date   BIOPSY  11/17/2020   Procedure: BIOPSY;  Surgeon: Harvel Quale, MD;  [facility];  Service: Gastroenterology;;   COLONOSCOPY  2006 and 2012   COLONOSCOPY  08/04/2010   COLONOSCOPY WITH PROPOFOL N/A 11/17/2020   Procedure: COLONOSCOPY WITH PROPOFOL;  Surgeon: Harvel Quale, MD;  [facility];  Service: Gastroenterology;  Laterality: N/A;  830   ESOPHAGOGASTRODUODENOSCOPY (EGD) WITH PROPOFOL N/A 11/17/2020   Procedure: ESOPHAGOGASTRODUODENOSCOPY (EGD) WITH PROPOFOL;  Surgeon: Harvel Quale, MD;  [facility];  Service: Gastroenterology;  Laterality: N/A;   PROSTATECTOMY     2011   Patient Active Problem List   Diagnosis Date Noted   Viral respiratory infection 09/08/2021   Hyperlipidemia 01/28/2021   Fatty liver 10/01/2020   Toenail fungus 03/28/2020   Pain in left shoulder 02/18/2020   Seasonal and perennial allergic rhinitis 11/28/2019   Insect sting allergy 11/28/2019   Chronic throat clearing 09/29/2019   Mass of right knee 02/21/2017   HTN (hypertension) 11/06/2016   Left shoulder  tendinitis 11/06/2016   Spondylolisthesis at L5-S1 level 09/23/2015   History of prostatectomy 05/11/2014   COLONIC POLYPS 06/01/2008   Allergic rhinitis 06/01/2008   ESOPHAGEAL REFLUX 11/29/2007   REFERRING PROVIDER: Kathyrn Drown, MD   REFERRING DIAG: Sciatica, right side   Rationale for Evaluation and Treatment: Rehabilitation  THERAPY DIAG:  Radiculopathy, lumbar region  ONSET DATE: years ago with a flare up "a few months ago"   SUBJECTIVE:  SUBJECTIVE STATEMENT: Patient reports that he was stiff this morning from working. He feels that he is a little better, but he still has to be careful when bending and twisting.   PERTINENT HISTORY:  HTN, history of prostate cancer, and left shoulder pain  PAIN:  Are you having pain? Yes: NPRS scale: none provided/10 Pain location: low back and right leg Pain description: ache,  Aggravating factors: moving his right leg, squatting, kneeling, twisting, standing and walking (1 mile at most) Relieving factors: sitting still, topical cream   PRECAUTIONS: None  WEIGHT BEARING RESTRICTIONS: No  FALLS:  Has patient fallen in last 6 months? No  LIVING ENVIRONMENT: Lives in: House/apartment Stairs: Yes: External: 2-3 steps;    OCCUPATION: truck driver; part time;   PLOF: Independent  PATIENT GOALS: reduced pain, play golf, and be able to do yard work, and stand and walk for longer  NEXT MD VISIT: 10/13/22  OBJECTIVE: all objective measures were assessed at his initial evaluation on 07/19/22 unless otherwise noted  PATIENT SURVEYS:  FOTO 40.31  SCREENING FOR RED FLAGS: Bowel or bladder incontinence: No Spinal tumors: No Cauda equina syndrome: No Compression fracture: No Abdominal aneurysm: No  COGNITION: Overall cognitive status: Within  functional limits for tasks assessed     SENSATION: Patient reports mild numbness in his right lateral hip   POSTURE: rounded shoulders, forward head, and flexed trunk   PALPATION: No significant tenderness to palpation  LUMBAR JOINT MOBILITY:  Hypomobile and nonpainful throughout  LUMBAR ROM:   AROM eval  Flexion 48  Extension 6  Right lateral flexion 25% limited  Left lateral flexion 25% limited  Right rotation 25% limited; "a little pulling"  Left rotation 50% limited; "a little pulling"    (Blank rows = not tested)  LOWER EXTREMITY ROM:  WFL for activities assessed  LOWER EXTREMITY MMT:    MMT Right eval Left eval  Hip flexion 4-/5; "pulling" in lower leg 4/5  Hip extension    Hip abduction    Hip adduction    Hip internal rotation    Hip external rotation    Knee flexion 4/5 4+/5  Knee extension 4-/5 4+/5  Ankle dorsiflexion 4+/5 4+/5  Ankle plantarflexion    Ankle inversion    Ankle eversion     (Blank rows = not tested)  LUMBAR SPECIAL TESTS:  Crossed straight leg raise: positive  FUNCTIONAL TESTS:  Squatting: requires significant UE support on his lower extremities  GAIT: Assistive device utilized: None Level of assistance: Complete Independence Comments: no significant gait deviations  TODAY'S TREATMENT:                                                                                                                              DATE:  1/16 EXERCISE LOG  Exercise Repetitions and Resistance Comments  Nustep  L5 x 6.5 minutes Limited by low back and thigh tightness  Ball roll out  3 minutes   Rocker board  2 minutes   Standing HS stretch 4 x 30 seconds each    Lunges onto step  8" step; 20 reps w/ 5 second hold    LAQ 4# x 2 minutes        Blank cell = exercise not performed today                                    1/9 EXERCISE LOG  Exercise Repetitions and Resistance Comments  Nustep  L4 x 15 minutes    Ball roll out  3 minutes   Lower trunk rotation 2 minutes Added to [employer] with march  20 reps  Added to HEP   SLR  30 reps each  Added to [employer]  5 minutes    Resisted pull down  Blue XTS x 2 minutes   Resisted wood chop  Blue XTS x 20 reps each     Blank cell = exercise not performed today                                    1/4 EXERCISE LOG  Exercise Repetitions and Resistance Comments  Nustep L1 x 10 minutes   Rocker board  5 minutes   Standing HS stretch  On 6" step; 4 x 30 seconds each    Isometric ball press 3 minutes w/ 5 second hold         Blank cell = exercise not performed today    PATIENT EDUCATION:  Education details: anatomy, healing, benefits of therapy, and goals for therapy Person educated: Patient Education method: Explanation Education comprehension: verbalized understanding  HOME EXERCISE PROGRAM:   ASSESSMENT:  CLINICAL IMPRESSION: Patient was introduced to lunges onto a step and long arc quads for improved lower extremity mobility and strength. He required minimal cueing with lunges for a brief hold to facilitate improved soft tissue extensibility. He reported feeling tired and sore upon the conclusion of treatment. He continues to require skilled physical therapy to address his remaining impairments to maximize his functional mobility.   OBJECTIVE IMPAIRMENTS: decreased activity tolerance, difficulty walking, decreased ROM, decreased strength, hypomobility, postural dysfunction, and pain.   ACTIVITY LIMITATIONS: lifting, bending, standing, squatting, and locomotion level  PARTICIPATION LIMITATIONS: meal prep, cleaning, laundry, shopping, community activity, and yard work  PERSONAL FACTORS: Time since onset of injury/illness/exacerbation and 3+ comorbidities: HTN, history of prostate cancer, and left shoulder pain  are also affecting patient's functional outcome.   REHAB POTENTIAL: Fair    CLINICAL DECISION MAKING: Evolving/moderate  complexity  EVALUATION COMPLEXITY: Moderate   GOALS: Goals reviewed with patient? Yes  LONG TERM GOALS: Target date: 08/16/22  Patient will be independent with his HEP.  Baseline:  Goal status: INITIAL  2.  Patient will be able to complete his daily activities without his familiar pain exceeding 5/10. Baseline:  Goal status: INITIAL  3.  Patient will be able to stand and walk for at least 30 minutes without being limited by his familiar symptoms.  Baseline:  Goal status: INITIAL  4.  Patient will be able to demonstrate proper lifting mechanics for improved function  with his daily activities.  Baseline:  Goal status: INITIAL  PLAN:  PT FREQUENCY: 1-2x/week  PT DURATION: 4 weeks  PLANNED INTERVENTIONS: Therapeutic exercises, Therapeutic activity, Neuromuscular re-education, Patient/Family education, Self Care, Joint mobilization, Electrical stimulation, Spinal mobilization, Cryotherapy, Moist heat, Traction, Manual therapy, and Re-evaluation.  PLAN FOR NEXT SESSION: nustep, lifting mechanics, lower extremity strengthening, and modalities as needed   Darlin Coco, PT 08/08/2022, 5:27 PM

## 2022-08-15 ENCOUNTER — Ambulatory Visit: Payer: Medicare Other

## 2022-08-15 DIAGNOSIS — M5416 Radiculopathy, lumbar region: Secondary | ICD-10-CM | POA: Diagnosis not present

## 2022-08-15 NOTE — Therapy (Addendum)
OUTPATIENT PHYSICAL THERAPY THORACOLUMBAR TREATMENT   Patient Name: David Pham MRN: [id number] DOB:1950/12/23, 72 y.o., male Today's Date: 08/15/2022  END OF SESSION:  PT End of Session - 08/15/22 1437     Visit Number 5    Number of Visits 8    Date for PT Re-Evaluation 09/15/22    PT Start Time 1430    PT Stop Time 1515    PT Time Calculation (min) 45 min    Activity Tolerance Patient tolerated treatment well    Behavior During Therapy [facility] for tasks assessed/performed              Past Medical History:  Diagnosis Date   Cancer (HCC) 09/2009   prostate   Hemorrhoids    Incontinence of feces    Rectal pain    Toenail fungus 03/28/2020   I would not recommend oral medication due to liver toxicity potential, treating topically   White coat hypertension    Past Surgical History:  Procedure Laterality Date   BIOPSY  11/17/2020   Procedure: BIOPSY;  Surgeon: Dolores Frame, MD;  Location: [facility];  Service: Gastroenterology;;   COLONOSCOPY  2006 and 2012   COLONOSCOPY  08/04/2010   COLONOSCOPY WITH PROPOFOL N/A 11/17/2020   Procedure: COLONOSCOPY WITH PROPOFOL;  Surgeon: Dolores Frame, MD;  Location: [facility];  Service: Gastroenterology;  Laterality: N/A;  830   ESOPHAGOGASTRODUODENOSCOPY (EGD) WITH PROPOFOL N/A 11/17/2020   Procedure: ESOPHAGOGASTRODUODENOSCOPY (EGD) WITH PROPOFOL;  Surgeon: Dolores Frame, MD;  Location: [facility];  Service: Gastroenterology;  Laterality: N/A;   PROSTATECTOMY     2011   Patient Active Problem List   Diagnosis Date Noted   Viral respiratory infection 09/08/2021   Hyperlipidemia 01/28/2021   Fatty liver 10/01/2020   Toenail fungus 03/28/2020   Pain in left shoulder 02/18/2020   Seasonal and perennial allergic rhinitis 11/28/2019   Insect sting allergy 11/28/2019   Chronic throat clearing 09/29/2019   Mass of right knee 02/21/2017   HTN (hypertension) 11/06/2016   Left shoulder  tendinitis 11/06/2016   Spondylolisthesis at L5-S1 level 09/23/2015   History of prostatectomy 05/11/2014   COLONIC POLYPS 06/01/2008   Allergic rhinitis 06/01/2008   ESOPHAGEAL REFLUX 11/29/2007   REFERRING PROVIDER: Babs Sciara, MD   REFERRING DIAG: Sciatica, right side   Rationale for Evaluation and Treatment: Rehabilitation  THERAPY DIAG:  Radiculopathy, lumbar region  ONSET DATE: years ago with a flare up "a few months ago"   SUBJECTIVE:  SUBJECTIVE STATEMENT: Patient reports that he feels about the same as normal. He notes that getting up in the morning is the worst, but it can ease up as the day goes on. He still feels like the disc is moving.   PERTINENT HISTORY:  HTN, history of prostate cancer, and left shoulder pain  PAIN:  Are you having pain? Yes: NPRS scale: none provided/10 Pain location: low back and right leg Pain description: ache,  Aggravating factors: moving his right leg, squatting, kneeling, twisting, standing and walking (1 mile at most) Relieving factors: sitting still, topical cream   PRECAUTIONS: None  WEIGHT BEARING RESTRICTIONS: No  FALLS:  Has patient fallen in last 6 months? No  LIVING ENVIRONMENT: Lives in: House/apartment Stairs: Yes: External: 2-3 steps;    OCCUPATION: truck driver; part time;   PLOF: Independent  PATIENT GOALS: reduced pain, play golf, and be able to do yard work, and stand and walk for longer  NEXT MD VISIT: 10/13/22  OBJECTIVE: all objective measures were assessed at his initial evaluation on 07/19/22 unless otherwise noted  PATIENT SURVEYS:  FOTO 46.94 on 08/15/22  SCREENING FOR RED FLAGS: Bowel or bladder incontinence: No Spinal tumors: No Cauda equina syndrome: No Compression fracture: No Abdominal aneurysm:  No  COGNITION: Overall cognitive status: Within functional limits for tasks assessed     SENSATION: Patient reports mild numbness in his right lateral hip   POSTURE: rounded shoulders, forward head, and flexed trunk   PALPATION: No significant tenderness to palpation  LUMBAR JOINT MOBILITY:  Hypomobile and nonpainful throughout  LUMBAR ROM:   AROM eval  Flexion 48  Extension 6  Right lateral flexion 25% limited  Left lateral flexion 25% limited  Right rotation 25% limited; "a little pulling"  Left rotation 50% limited; "a little pulling"    (Blank rows = not tested)  LOWER EXTREMITY ROM:  WFL for activities assessed  LOWER EXTREMITY MMT:    MMT Right eval Left eval  Hip flexion 4-/5; "pulling" in lower leg 4/5  Hip extension    Hip abduction    Hip adduction    Hip internal rotation    Hip external rotation    Knee flexion 4/5 4+/5  Knee extension 4-/5 4+/5  Ankle dorsiflexion 4+/5 4+/5  Ankle plantarflexion    Ankle inversion    Ankle eversion     (Blank rows = not tested)  LUMBAR SPECIAL TESTS:  Crossed straight leg raise: positive  FUNCTIONAL TESTS:  Squatting: requires significant UE support on his lower extremities  GAIT: Assistive device utilized: None Level of assistance: Complete Independence Comments: no significant gait deviations  TODAY'S TREATMENT:                                                                                                                              DATE:  1/23 EXERCISE LOG  Exercise Repetitions and Resistance Comments  Nustep  L3 x 15 minutes   Lateral step up  6" step x 2 minutes   Standing HS stretch  4 x 30 seconds each    LAQ 4# x 3 minutes        Blank cell = exercise not performed today                                    1/16 EXERCISE LOG  Exercise Repetitions and Resistance Comments  Nustep  L5 x 6.5 minutes Limited by low back and thigh tightness  Ball roll out  3  minutes   Rocker board  2 minutes   Standing HS stretch 4 x 30 seconds each    Lunges onto step  8" step; 20 reps w/ 5 second hold    LAQ 4# x 2 minutes        Blank cell = exercise not performed today                                    1/9 EXERCISE LOG  Exercise Repetitions and Resistance Comments  Nustep  L4 x 15 minutes   Ball roll out  3 minutes   Lower trunk rotation 2 minutes Added to [employer] with march  20 reps  Added to HEP   SLR  30 reps each  Added to [employer]  5 minutes    Resisted pull down  Blue XTS x 2 minutes   Resisted wood chop  Blue XTS x 20 reps each     Blank cell = exercise not performed today   PATIENT EDUCATION:  Education details: progress with therapy, contacting his physician, and anatomy Person educated: Patient Education method: Explanation Education comprehension: verbalized understanding  HOME EXERCISE PROGRAM:   ASSESSMENT:  CLINICAL IMPRESSION: Patient has made minimal progress with skilled physical therapy as evidenced by his subjective reports, functional mobility, and progress toward his goals. He was able to meet his standing goal. However, he continues to experience increased pain with activities such as squatting and lumbar rotation. Treatment focused on familiar interventions for improved lumbar strength and stability. He was educated on lumbar anatomy and how his this relates to his progress with physical therapy. He was also recommended to contact his referring physician due to his lack of significant progress. He may benefit from additional medical intervention due to his continued pain severity.   PHYSICAL THERAPY DISCHARGE SUMMARY  Visits from Start of Care: 5  Current functional level related to goals / functional outcomes: See clinical impression statement   Remaining deficits: Pain    Education / Equipment: HEP   Patient agrees to discharge. Patient goals were partially met. Patient is being discharged due  to not returning since the last visit.  Candi Leash, PT, DPT    OBJECTIVE IMPAIRMENTS: decreased activity tolerance, difficulty walking, decreased ROM, decreased strength, hypomobility, postural dysfunction, and pain.   ACTIVITY LIMITATIONS: lifting, bending, standing, squatting, and locomotion level  PARTICIPATION LIMITATIONS: meal prep, cleaning, laundry, shopping, community activity, and yard work  PERSONAL FACTORS: Time since onset of injury/illness/exacerbation and 3+ comorbidities: HTN, history of prostate cancer, and left shoulder pain  are also affecting patient's functional outcome.   REHAB POTENTIAL: Fair    CLINICAL DECISION MAKING:  Evolving/moderate complexity  EVALUATION COMPLEXITY: Moderate   GOALS: Goals reviewed with patient? Yes  LONG TERM GOALS: Target date: 08/16/22  Patient will be independent with his HEP.  Baseline:  Goal status: IN PROGRESS  2.  Patient will be able to complete his daily activities without his familiar pain exceeding 5/10. Baseline:  Goal status: IN PROGRESS  3.  Patient will be able to stand and walk for at least 30 minutes without being limited by his familiar symptoms.  Baseline: about 1 mile or around 30 minutes Goal status: MET  4.  Patient will be able to demonstrate proper lifting mechanics for improved function with his daily activities.  Baseline:  Goal status: INITIAL  PLAN:  PT FREQUENCY: 1-2x/week  PT DURATION: 4 weeks  PLANNED INTERVENTIONS: Therapeutic exercises, Therapeutic activity, Neuromuscular re-education, Patient/Family education, Self Care, Joint mobilization, Electrical stimulation, Spinal mobilization, Cryotherapy, Moist heat, Traction, Manual therapy, and Re-evaluation.  PLAN FOR NEXT SESSION: nustep, lifting mechanics, lower extremity strengthening, and modalities as needed   Granville Lewis, PT 08/15/2022, 6:05 PM

## 2022-08-20 DIAGNOSIS — R519 Headache, unspecified: Secondary | ICD-10-CM | POA: Diagnosis not present

## 2022-08-20 DIAGNOSIS — J019 Acute sinusitis, unspecified: Secondary | ICD-10-CM | POA: Diagnosis not present

## 2022-08-20 DIAGNOSIS — B9689 Other specified bacterial agents as the cause of diseases classified elsewhere: Secondary | ICD-10-CM | POA: Diagnosis not present

## 2022-08-23 ENCOUNTER — Other Ambulatory Visit: Payer: Self-pay | Admitting: Family Medicine

## 2022-08-24 ENCOUNTER — Ambulatory Visit (INDEPENDENT_AMBULATORY_CARE_PROVIDER_SITE_OTHER): Payer: Medicare Other | Admitting: Family Medicine

## 2022-08-24 VITALS — BP 138/84 | HR 82 | Temp 98.0°F | Wt 159.6 lb

## 2022-08-24 DIAGNOSIS — R519 Headache, unspecified: Secondary | ICD-10-CM | POA: Diagnosis not present

## 2022-08-24 DIAGNOSIS — J019 Acute sinusitis, unspecified: Secondary | ICD-10-CM | POA: Diagnosis not present

## 2022-08-24 MED ORDER — DOXYCYCLINE HYCLATE 100 MG PO TABS: 100.0000 mg | ORAL_TABLET | Freq: Two times a day (BID) | ORAL | 0 refills | Status: DC

## 2022-08-24 NOTE — Progress Notes (Signed)
   Subjective:    Patient ID: David Pham, male    DOB: 1951-01-14, 72 y.o.   MRN: [id number]  Sinusitis This is a recurrent problem. There has been no fever. Associated symptoms include ear pain, headaches and sinus pressure. Past treatments include antibiotics. The treatment provided mild relief.  Patient with persistent head congestion drainage coughing drainage at times gives him left temporal headache he brings this up several times.  Headache is actually doing better currently     Review of Systems  HENT:  Positive for ear pain and sinus pressure.   Neurological:  Positive for headaches.       Objective:   Physical Exam Mild tenderness in the temporal region throat is normal lungs clear heart regular  Very nice patient     Assessment & Plan:  Acute sinusitis it is 1 more week of antibiotic Temporal headache sed rate to rule out temporal arteritis which I think is a low likelihood Warning signs were discussed Reassurance given as best as possible Follow-up for regular checks

## 2022-08-25 LAB — SEDIMENTATION RATE: Sed Rate: 2 mm/hr (ref 0–30)

## 2022-08-29 DIAGNOSIS — Z87442 Personal history of urinary calculi: Secondary | ICD-10-CM | POA: Diagnosis not present

## 2022-08-30 DIAGNOSIS — S134XXA Sprain of ligaments of cervical spine, initial encounter: Secondary | ICD-10-CM | POA: Diagnosis not present

## 2022-08-30 DIAGNOSIS — S233XXA Sprain of ligaments of thoracic spine, initial encounter: Secondary | ICD-10-CM | POA: Diagnosis not present

## 2022-08-30 DIAGNOSIS — M47816 Spondylosis without myelopathy or radiculopathy, lumbar region: Secondary | ICD-10-CM | POA: Diagnosis not present

## 2022-08-30 DIAGNOSIS — M9901 Segmental and somatic dysfunction of cervical region: Secondary | ICD-10-CM | POA: Diagnosis not present

## 2022-08-30 DIAGNOSIS — M9902 Segmental and somatic dysfunction of thoracic region: Secondary | ICD-10-CM | POA: Diagnosis not present

## 2022-08-30 DIAGNOSIS — S335XXA Sprain of ligaments of lumbar spine, initial encounter: Secondary | ICD-10-CM | POA: Diagnosis not present

## 2022-08-30 DIAGNOSIS — M9903 Segmental and somatic dysfunction of lumbar region: Secondary | ICD-10-CM | POA: Diagnosis not present

## 2022-09-04 DIAGNOSIS — S335XXA Sprain of ligaments of lumbar spine, initial encounter: Secondary | ICD-10-CM | POA: Diagnosis not present

## 2022-09-04 DIAGNOSIS — M9903 Segmental and somatic dysfunction of lumbar region: Secondary | ICD-10-CM | POA: Diagnosis not present

## 2022-09-04 DIAGNOSIS — S134XXA Sprain of ligaments of cervical spine, initial encounter: Secondary | ICD-10-CM | POA: Diagnosis not present

## 2022-09-04 DIAGNOSIS — M47816 Spondylosis without myelopathy or radiculopathy, lumbar region: Secondary | ICD-10-CM | POA: Diagnosis not present

## 2022-09-04 DIAGNOSIS — M9901 Segmental and somatic dysfunction of cervical region: Secondary | ICD-10-CM | POA: Diagnosis not present

## 2022-09-04 DIAGNOSIS — M9902 Segmental and somatic dysfunction of thoracic region: Secondary | ICD-10-CM | POA: Diagnosis not present

## 2022-09-04 DIAGNOSIS — S233XXA Sprain of ligaments of thoracic spine, initial encounter: Secondary | ICD-10-CM | POA: Diagnosis not present

## 2022-09-07 DIAGNOSIS — M9901 Segmental and somatic dysfunction of cervical region: Secondary | ICD-10-CM | POA: Diagnosis not present

## 2022-09-07 DIAGNOSIS — S335XXA Sprain of ligaments of lumbar spine, initial encounter: Secondary | ICD-10-CM | POA: Diagnosis not present

## 2022-09-07 DIAGNOSIS — S134XXA Sprain of ligaments of cervical spine, initial encounter: Secondary | ICD-10-CM | POA: Diagnosis not present

## 2022-09-07 DIAGNOSIS — M9903 Segmental and somatic dysfunction of lumbar region: Secondary | ICD-10-CM | POA: Diagnosis not present

## 2022-09-07 DIAGNOSIS — S233XXA Sprain of ligaments of thoracic spine, initial encounter: Secondary | ICD-10-CM | POA: Diagnosis not present

## 2022-09-07 DIAGNOSIS — M9902 Segmental and somatic dysfunction of thoracic region: Secondary | ICD-10-CM | POA: Diagnosis not present

## 2022-09-07 DIAGNOSIS — M47816 Spondylosis without myelopathy or radiculopathy, lumbar region: Secondary | ICD-10-CM | POA: Diagnosis not present

## 2022-09-14 DIAGNOSIS — S335XXA Sprain of ligaments of lumbar spine, initial encounter: Secondary | ICD-10-CM | POA: Diagnosis not present

## 2022-09-14 DIAGNOSIS — S134XXA Sprain of ligaments of cervical spine, initial encounter: Secondary | ICD-10-CM | POA: Diagnosis not present

## 2022-09-14 DIAGNOSIS — M9903 Segmental and somatic dysfunction of lumbar region: Secondary | ICD-10-CM | POA: Diagnosis not present

## 2022-09-14 DIAGNOSIS — S233XXA Sprain of ligaments of thoracic spine, initial encounter: Secondary | ICD-10-CM | POA: Diagnosis not present

## 2022-09-14 DIAGNOSIS — M9902 Segmental and somatic dysfunction of thoracic region: Secondary | ICD-10-CM | POA: Diagnosis not present

## 2022-09-14 DIAGNOSIS — M9901 Segmental and somatic dysfunction of cervical region: Secondary | ICD-10-CM | POA: Diagnosis not present

## 2022-09-14 DIAGNOSIS — M47816 Spondylosis without myelopathy or radiculopathy, lumbar region: Secondary | ICD-10-CM | POA: Diagnosis not present

## 2022-09-19 DIAGNOSIS — M47816 Spondylosis without myelopathy or radiculopathy, lumbar region: Secondary | ICD-10-CM | POA: Diagnosis not present

## 2022-09-19 DIAGNOSIS — M9901 Segmental and somatic dysfunction of cervical region: Secondary | ICD-10-CM | POA: Diagnosis not present

## 2022-09-19 DIAGNOSIS — S134XXA Sprain of ligaments of cervical spine, initial encounter: Secondary | ICD-10-CM | POA: Diagnosis not present

## 2022-09-19 DIAGNOSIS — M9902 Segmental and somatic dysfunction of thoracic region: Secondary | ICD-10-CM | POA: Diagnosis not present

## 2022-09-19 DIAGNOSIS — M9903 Segmental and somatic dysfunction of lumbar region: Secondary | ICD-10-CM | POA: Diagnosis not present

## 2022-09-19 DIAGNOSIS — S335XXA Sprain of ligaments of lumbar spine, initial encounter: Secondary | ICD-10-CM | POA: Diagnosis not present

## 2022-09-19 DIAGNOSIS — S233XXA Sprain of ligaments of thoracic spine, initial encounter: Secondary | ICD-10-CM | POA: Diagnosis not present

## 2022-09-21 DIAGNOSIS — S134XXA Sprain of ligaments of cervical spine, initial encounter: Secondary | ICD-10-CM | POA: Diagnosis not present

## 2022-09-21 DIAGNOSIS — M9902 Segmental and somatic dysfunction of thoracic region: Secondary | ICD-10-CM | POA: Diagnosis not present

## 2022-09-21 DIAGNOSIS — M9901 Segmental and somatic dysfunction of cervical region: Secondary | ICD-10-CM | POA: Diagnosis not present

## 2022-09-21 DIAGNOSIS — S233XXA Sprain of ligaments of thoracic spine, initial encounter: Secondary | ICD-10-CM | POA: Diagnosis not present

## 2022-09-21 DIAGNOSIS — M9903 Segmental and somatic dysfunction of lumbar region: Secondary | ICD-10-CM | POA: Diagnosis not present

## 2022-09-26 DIAGNOSIS — M9903 Segmental and somatic dysfunction of lumbar region: Secondary | ICD-10-CM | POA: Diagnosis not present

## 2022-09-26 DIAGNOSIS — S233XXA Sprain of ligaments of thoracic spine, initial encounter: Secondary | ICD-10-CM | POA: Diagnosis not present

## 2022-09-26 DIAGNOSIS — M9901 Segmental and somatic dysfunction of cervical region: Secondary | ICD-10-CM | POA: Diagnosis not present

## 2022-09-26 DIAGNOSIS — M9902 Segmental and somatic dysfunction of thoracic region: Secondary | ICD-10-CM | POA: Diagnosis not present

## 2022-09-26 DIAGNOSIS — M47816 Spondylosis without myelopathy or radiculopathy, lumbar region: Secondary | ICD-10-CM | POA: Diagnosis not present

## 2022-09-26 DIAGNOSIS — S335XXA Sprain of ligaments of lumbar spine, initial encounter: Secondary | ICD-10-CM | POA: Diagnosis not present

## 2022-09-26 DIAGNOSIS — S134XXA Sprain of ligaments of cervical spine, initial encounter: Secondary | ICD-10-CM | POA: Diagnosis not present

## 2022-10-03 DIAGNOSIS — M9903 Segmental and somatic dysfunction of lumbar region: Secondary | ICD-10-CM | POA: Diagnosis not present

## 2022-10-03 DIAGNOSIS — M47816 Spondylosis without myelopathy or radiculopathy, lumbar region: Secondary | ICD-10-CM | POA: Diagnosis not present

## 2022-10-03 DIAGNOSIS — M9901 Segmental and somatic dysfunction of cervical region: Secondary | ICD-10-CM | POA: Diagnosis not present

## 2022-10-03 DIAGNOSIS — S233XXA Sprain of ligaments of thoracic spine, initial encounter: Secondary | ICD-10-CM | POA: Diagnosis not present

## 2022-10-03 DIAGNOSIS — S134XXA Sprain of ligaments of cervical spine, initial encounter: Secondary | ICD-10-CM | POA: Diagnosis not present

## 2022-10-03 DIAGNOSIS — M9902 Segmental and somatic dysfunction of thoracic region: Secondary | ICD-10-CM | POA: Diagnosis not present

## 2022-10-03 DIAGNOSIS — S335XXA Sprain of ligaments of lumbar spine, initial encounter: Secondary | ICD-10-CM | POA: Diagnosis not present

## 2022-10-10 DIAGNOSIS — S335XXA Sprain of ligaments of lumbar spine, initial encounter: Secondary | ICD-10-CM | POA: Diagnosis not present

## 2022-10-10 DIAGNOSIS — M9902 Segmental and somatic dysfunction of thoracic region: Secondary | ICD-10-CM | POA: Diagnosis not present

## 2022-10-10 DIAGNOSIS — M9901 Segmental and somatic dysfunction of cervical region: Secondary | ICD-10-CM | POA: Diagnosis not present

## 2022-10-10 DIAGNOSIS — M9903 Segmental and somatic dysfunction of lumbar region: Secondary | ICD-10-CM | POA: Diagnosis not present

## 2022-10-10 DIAGNOSIS — M47816 Spondylosis without myelopathy or radiculopathy, lumbar region: Secondary | ICD-10-CM | POA: Diagnosis not present

## 2022-10-10 DIAGNOSIS — S233XXA Sprain of ligaments of thoracic spine, initial encounter: Secondary | ICD-10-CM | POA: Diagnosis not present

## 2022-10-10 DIAGNOSIS — S134XXA Sprain of ligaments of cervical spine, initial encounter: Secondary | ICD-10-CM | POA: Diagnosis not present

## 2022-10-13 ENCOUNTER — Ambulatory Visit (INDEPENDENT_AMBULATORY_CARE_PROVIDER_SITE_OTHER): Payer: Medicare Other | Admitting: Family Medicine

## 2022-10-13 VITALS — BP 124/76 | HR 60 | Temp 97.4°F | Ht 70.0 in | Wt 162.0 lb

## 2022-10-13 DIAGNOSIS — N529 Male erectile dysfunction, unspecified: Secondary | ICD-10-CM | POA: Diagnosis not present

## 2022-10-13 DIAGNOSIS — I10 Essential (primary) hypertension: Secondary | ICD-10-CM | POA: Diagnosis not present

## 2022-10-13 DIAGNOSIS — G8929 Other chronic pain: Secondary | ICD-10-CM

## 2022-10-13 DIAGNOSIS — C9110 Chronic lymphocytic leukemia of B-cell type not having achieved remission: Secondary | ICD-10-CM

## 2022-10-13 DIAGNOSIS — M25512 Pain in left shoulder: Secondary | ICD-10-CM | POA: Diagnosis not present

## 2022-10-13 DIAGNOSIS — E785 Hyperlipidemia, unspecified: Secondary | ICD-10-CM | POA: Diagnosis not present

## 2022-10-13 DIAGNOSIS — J302 Other seasonal allergic rhinitis: Secondary | ICD-10-CM

## 2022-10-13 DIAGNOSIS — J3089 Other allergic rhinitis: Secondary | ICD-10-CM | POA: Diagnosis not present

## 2022-10-13 MED ORDER — AMLODIPINE BESYLATE 2.5 MG PO TABS: 2.5000 mg | ORAL_TABLET | Freq: Every day | ORAL | 1 refills | Status: DC

## 2022-10-13 MED ORDER — TADALAFIL 20 MG PO TABS: 10.0000 mg | ORAL_TABLET | ORAL | 1 refills | Status: AC | PRN

## 2022-10-13 NOTE — Progress Notes (Unsigned)
   Subjective:    Patient ID: David Pham, male    DOB: 08-30-1950, 72 y.o.   MRN: [id number]  HPI Seasonal and perennial allergic rhinitis  Chronic left shoulder pain - Plan: Ambulatory referral to Orthopedic Surgery, CANCELED: Ambulatory referral to Orthopedic Surgery  Primary hypertension - Plan: Microalbumin/Creatinine Ratio, Urine  Erectile dysfunction, unspecified erectile dysfunction type  Hyperlipidemia, unspecified hyperlipidemia type - Plan: Lipid Panel  CLL (chronic lymphocytic leukemia) ([place])  Patient comes in multiple different things going on left shoulder pain discomfort been going on for a period of time but now it is getting to the point where it hurts him a lot.  He does a lot of lifting with his job but not heavy.  He also plays golf and states it is impeding his ability to do the activities he would like to do he has tried Tylenol and tried some gentle range of motion denies any radiation down the arm no numbness  Patient for blood pressure check up.  The patient does have hypertension.   Patient relates dietary measures try to minimize salt The importance of healthy diet and activity were discussed Patient relates compliance  Does have underlying CLL.  He is being followed by oncology.  He is worried that it could cause serious issue.  We did try to give him some reasonable reassurance that more than likely all of this can be managed appropriately He does have hyperlipidemia recommend checking lipid profile await the results of this He also has erectile dysfunction related to previous prostatectomy patient currently is interested in trying Cialis but at the same time he has concerns about it we did discuss all of this    Review of Systems     Objective:   Physical Exam  General-in no acute distress Eyes-no discharge Lungs-respiratory rate normal, CTA CV-no murmurs,RRR Extremities skin warm dry no edema Neuro grossly normal Behavior normal,  alert       Assessment & Plan:   1. Seasonal and perennial allergic rhinitis Allergy to an allergy spray pathophysiology discussed with patient  2. Chronic left shoulder pain Gentle range of motion exercises referral to orthopedics may need injection and physical therapy - Ambulatory referral to Orthopedic Surgery  3. Primary hypertension HTN- patient seen for follow-up regarding HTN.   Diet, medication compliance, appropriate labs and refills were completed.   Importance of keeping blood pressure under good control to lessen the risk of complications discussed Regular follow-up visits discussed  - Microalbumin/Creatinine Ratio, Urine  4. Erectile dysfunction, unspecified erectile dysfunction type Patient request Cialis this will see how this does for him limited number given he will shop around for best price  5. Hyperlipidemia, unspecified hyperlipidemia type Hyperlipidemia-importance of diet, weight control, activity, compliance with medications discussed.   Recent labs reviewed.   Any additional labs or refills ordered.   Importance of keeping under good control discussed. Regular follow-up visits discussed  CLL-stable followed by oncology currently no sign of any type of secondary issues with this no fevers or weight loss or night sweats - Lipid Panel Wellness later this year

## 2022-10-21 DIAGNOSIS — B9689 Other specified bacterial agents as the cause of diseases classified elsewhere: Secondary | ICD-10-CM | POA: Diagnosis not present

## 2022-10-21 DIAGNOSIS — Z880 Allergy status to penicillin: Secondary | ICD-10-CM | POA: Diagnosis not present

## 2022-10-21 DIAGNOSIS — J208 Acute bronchitis due to other specified organisms: Secondary | ICD-10-CM | POA: Diagnosis not present

## 2022-10-25 DIAGNOSIS — S134XXA Sprain of ligaments of cervical spine, initial encounter: Secondary | ICD-10-CM | POA: Diagnosis not present

## 2022-10-25 DIAGNOSIS — M9901 Segmental and somatic dysfunction of cervical region: Secondary | ICD-10-CM | POA: Diagnosis not present

## 2022-10-25 DIAGNOSIS — S233XXA Sprain of ligaments of thoracic spine, initial encounter: Secondary | ICD-10-CM | POA: Diagnosis not present

## 2022-10-25 DIAGNOSIS — M9902 Segmental and somatic dysfunction of thoracic region: Secondary | ICD-10-CM | POA: Diagnosis not present

## 2022-10-25 DIAGNOSIS — S335XXA Sprain of ligaments of lumbar spine, initial encounter: Secondary | ICD-10-CM | POA: Diagnosis not present

## 2022-10-25 DIAGNOSIS — M9903 Segmental and somatic dysfunction of lumbar region: Secondary | ICD-10-CM | POA: Diagnosis not present

## 2022-10-25 DIAGNOSIS — M47816 Spondylosis without myelopathy or radiculopathy, lumbar region: Secondary | ICD-10-CM | POA: Diagnosis not present

## 2022-11-03 DIAGNOSIS — M25512 Pain in left shoulder: Secondary | ICD-10-CM | POA: Diagnosis not present

## 2022-11-07 DIAGNOSIS — M9902 Segmental and somatic dysfunction of thoracic region: Secondary | ICD-10-CM | POA: Diagnosis not present

## 2022-11-07 DIAGNOSIS — M9901 Segmental and somatic dysfunction of cervical region: Secondary | ICD-10-CM | POA: Diagnosis not present

## 2022-11-07 DIAGNOSIS — M47816 Spondylosis without myelopathy or radiculopathy, lumbar region: Secondary | ICD-10-CM | POA: Diagnosis not present

## 2022-11-07 DIAGNOSIS — S233XXA Sprain of ligaments of thoracic spine, initial encounter: Secondary | ICD-10-CM | POA: Diagnosis not present

## 2022-11-07 DIAGNOSIS — S335XXA Sprain of ligaments of lumbar spine, initial encounter: Secondary | ICD-10-CM | POA: Diagnosis not present

## 2022-11-07 DIAGNOSIS — M9903 Segmental and somatic dysfunction of lumbar region: Secondary | ICD-10-CM | POA: Diagnosis not present

## 2022-11-07 DIAGNOSIS — S134XXA Sprain of ligaments of cervical spine, initial encounter: Secondary | ICD-10-CM | POA: Diagnosis not present

## 2022-11-29 ENCOUNTER — Inpatient Hospital Stay: Payer: Medicare Other | Attending: Hematology

## 2022-11-29 DIAGNOSIS — Z808 Family history of malignant neoplasm of other organs or systems: Secondary | ICD-10-CM | POA: Diagnosis not present

## 2022-11-29 DIAGNOSIS — C9110 Chronic lymphocytic leukemia of B-cell type not having achieved remission: Secondary | ICD-10-CM | POA: Diagnosis not present

## 2022-11-29 DIAGNOSIS — F17290 Nicotine dependence, other tobacco product, uncomplicated: Secondary | ICD-10-CM | POA: Insufficient documentation

## 2022-11-29 DIAGNOSIS — Z8546 Personal history of malignant neoplasm of prostate: Secondary | ICD-10-CM | POA: Insufficient documentation

## 2022-11-29 LAB — CBC WITH DIFFERENTIAL/PLATELET
Abs Immature Granulocytes: 0 10*3/uL (ref 0.00–0.07)
Basophils Absolute: 0.3 10*3/uL — ABNORMAL HIGH (ref 0.0–0.1)
Basophils Relative: 1 %
Eosinophils Absolute: 0 10*3/uL (ref 0.0–0.5)
Eosinophils Relative: 0 %
HCT: 41.9 % (ref 39.0–52.0)
Hemoglobin: 13.6 g/dL (ref 13.0–17.0)
Lymphocytes Relative: 84 %
Lymphs Abs: 25.8 10*3/uL — ABNORMAL HIGH (ref 0.7–4.0)
MCH: 31.1 pg (ref 26.0–34.0)
MCHC: 32.5 g/dL (ref 30.0–36.0)
MCV: 95.7 fL (ref 80.0–100.0)
Monocytes Absolute: 0.3 10*3/uL (ref 0.1–1.0)
Monocytes Relative: 1 %
Neutro Abs: 4.3 10*3/uL (ref 1.7–7.7)
Neutrophils Relative %: 14 %
Platelets: 190 10*3/uL (ref 150–400)
RBC: 4.38 MIL/uL (ref 4.22–5.81)
RDW: 12.5 % (ref 11.5–15.5)
WBC: 30.7 10*3/uL — ABNORMAL HIGH (ref 4.0–10.5)
nRBC: 0 % (ref 0.0–0.2)

## 2022-11-29 LAB — COMPREHENSIVE METABOLIC PANEL
ALT: 19 U/L (ref 0–44)
AST: 22 U/L (ref 15–41)
Albumin: 3.7 g/dL (ref 3.5–5.0)
Alkaline Phosphatase: 64 U/L (ref 38–126)
Anion gap: 7 (ref 5–15)
BUN: 12 mg/dL (ref 8–23)
CO2: 24 mmol/L (ref 22–32)
Calcium: 8.4 mg/dL — ABNORMAL LOW (ref 8.9–10.3)
Chloride: 105 mmol/L (ref 98–111)
Creatinine, Ser: 0.77 mg/dL (ref 0.61–1.24)
GFR, Estimated: >60 mL/min (ref >60)
Glucose, Bld: 60 mg/dL — ABNORMAL LOW (ref 70–99)
Potassium: 3.8 mmol/L (ref 3.5–5.1)
Sodium: 136 mmol/L (ref 135–145)
Total Bilirubin: 0.9 mg/dL (ref 0.3–1.2)
Total Protein: 6.5 g/dL (ref 6.5–8.1)

## 2022-11-29 LAB — LACTATE DEHYDROGENASE: LDH: 138 U/L (ref 98–192)

## 2022-12-06 ENCOUNTER — Inpatient Hospital Stay: Payer: Medicare Other | Admitting: Hematology

## 2022-12-07 ENCOUNTER — Inpatient Hospital Stay: Payer: Medicare Other | Admitting: Physician Assistant

## 2022-12-07 ENCOUNTER — Encounter: Payer: Self-pay | Admitting: Physician Assistant

## 2022-12-07 VITALS — BP 149/83 | HR 73 | Temp 97.5°F | Resp 18 | Wt 164.0 lb

## 2022-12-07 DIAGNOSIS — C9110 Chronic lymphocytic leukemia of B-cell type not having achieved remission: Secondary | ICD-10-CM | POA: Diagnosis not present

## 2022-12-07 DIAGNOSIS — Z808 Family history of malignant neoplasm of other organs or systems: Secondary | ICD-10-CM | POA: Diagnosis not present

## 2022-12-07 DIAGNOSIS — F17290 Nicotine dependence, other tobacco product, uncomplicated: Secondary | ICD-10-CM | POA: Diagnosis not present

## 2022-12-07 NOTE — Progress Notes (Signed)
David Pham [address], [place] [address]   CLINIC:  Medical Oncology/Hematology  PCP:  Babs Sciara, MD [address] / [place] [address]  [phone]  REASON FOR VISIT:  Follow-up for CLL  PRIOR THERAPY: none  CURRENT THERAPY: surveillance  INTERVAL HISTORY:  David Pham, a 72 y.o. male, returns for follow-up of CLL. He was last seen by Dr. Ellin Saba on 05/30/2022. He reports that overall he is doing well without any new or concerning symptoms. His energy is stable and he continues to complete all his ADLs on his own. He denies any weight loss. He denies any fevers, chills, night sweats. He denies any palpable lumps or bumps. He has no other complaints. Rest of the ROS is below.    REVIEW OF SYSTEMS:  Review of Systems  Constitutional:  Negative for appetite change, fatigue, fever and unexpected weight change.  HENT:   Negative for lump/mass.   Respiratory:  Negative for cough and shortness of breath.   Cardiovascular:  Negative for chest pain.  Gastrointestinal:  Negative for blood in stool, constipation, diarrhea, nausea and vomiting.  Endocrine: Negative for hot flashes.  Musculoskeletal:  Positive for arthralgias (2/10).  Skin:  Negative for rash.  Neurological:  Negative for numbness.  All other systems reviewed and are negative.   PAST MEDICAL/SURGICAL HISTORY:  Past Medical History:  Diagnosis Date   Cancer (HCC) 09/2009   prostate   Hemorrhoids    Incontinence of feces    Rectal pain    Toenail fungus 03/28/2020   I would not recommend oral medication due to liver toxicity potential, treating topically   White coat hypertension    Past Surgical History:  Procedure Laterality Date   BIOPSY  11/17/2020   Procedure: BIOPSY;  Surgeon: Dolores Frame, MD;  Location: [facility];  Service: Gastroenterology;;   COLONOSCOPY  2006 and 2012   COLONOSCOPY  08/04/2010   COLONOSCOPY WITH PROPOFOL N/A  11/17/2020   Procedure: COLONOSCOPY WITH PROPOFOL;  Surgeon: Dolores Frame, MD;  Location: [facility];  Service: Gastroenterology;  Laterality: N/A;  830   ESOPHAGOGASTRODUODENOSCOPY (EGD) WITH PROPOFOL N/A 11/17/2020   Procedure: ESOPHAGOGASTRODUODENOSCOPY (EGD) WITH PROPOFOL;  Surgeon: Dolores Frame, MD;  Location: [facility];  Service: Gastroenterology;  Laterality: N/A;   PROSTATECTOMY     2011    SOCIAL HISTORY:  Social History   Socioeconomic History   Marital status: Single    Spouse name: Not on file   Number of children: Not on file   Years of education: Not on file   Highest education level: Not on file  Occupational History   Not on file  Tobacco Use   Smoking status: Never   Smokeless tobacco: Current    Types: Snuff  Vaping Use   Vaping Use: Never used  Substance and Sexual Activity   Alcohol use: Never   Drug use: Never   Sexual activity: Not on file  Other Topics Concern   Not on file  Social History Narrative   Not on file   Social Determinants of Health   Financial Resource Strain: Not on file  Food Insecurity: Not on file  Transportation Needs: No Transportation Needs (10/08/2020)   PRAPARE - Administrator, Civil Service (Medical): No    Lack of Transportation (Non-Medical): No  Physical Activity: Inactive (10/08/2020)   Exercise Vital Sign    Days of Exercise per Week:  0 days    Minutes of Exercise per Session: 0 min  Stress: Not on file  Social Connections: Not on file  Intimate Partner Violence: Not At Risk (10/08/2020)   Humiliation, Afraid, Rape, and Kick questionnaire    Fear of Current or Ex-Partner: No    Emotionally Abused: No    Physically Abused: No    Sexually Abused: No    FAMILY HISTORY:  Family History  Problem Relation Age of Onset   Cancer Sister        breast   Hypertension Brother    Diabetes Brother    Emphysema Mother    Asthma Father    Allergic rhinitis Father    Angioedema  Neg Hx    Atopy Neg Hx    Eczema Neg Hx    Immunodeficiency Neg Hx    Urticaria Neg Hx     CURRENT MEDICATIONS:  Current Outpatient Medications  Medication Sig Dispense Refill   acetaminophen (TYLENOL) 650 MG CR tablet Take 650 mg by mouth every 8 (eight) hours as needed for pain.     amLODipine (NORVASC) 2.5 MG tablet Take 1 tablet (2.5 mg total) by mouth daily. 90 tablet 1   Camphor-Menthol-Methyl Sal (SALONPAS) 3.07-29-08 % PTCH Place 1 patch onto the skin daily as needed (back pain).     Cholecalciferol (VITAMIN D3) 50 MCG (2000 UT) TABS Take 2,000 Units by mouth in the morning.     fexofenadine (ALLEGRA) 180 MG tablet Take 180 mg by mouth daily.     fluticasone (FLONASE) 50 MCG/ACT nasal spray Place 2 sprays into both nostrils daily. 48 g 2   Multiple Vitamins-Minerals (MULTIVITAMIN WITH MINERALS) tablet Take 1 tablet by mouth daily.     omeprazole (PRILOSEC) 20 MG capsule TAKE 1 CAPSULE BY MOUTH DAILY 90 capsule 3   Polyethyl Glycol-Propyl Glycol (SYSTANE) 0.4-0.3 % SOLN Place 1-2 drops into both eyes 3 (three) times daily as needed (dry/irritated eyes.).     Polyvinyl Alcohol-Povidone (REFRESH OP) Place 1-2 drops into both eyes 3 (three) times daily as needed (dry/irritated eyes.).     tadalafil (CIALIS) 20 MG tablet Take 0.5-1 tablets (10-20 mg total) by mouth every other day as needed for erectile dysfunction. 5 tablet 1   vitamin B-12 (CYANOCOBALAMIN) 1000 MCG tablet Take 1,000 mcg by mouth in the morning.     No current facility-administered medications for this visit.    ALLERGIES:  Allergies  Allergen Reactions   Penicillins Other (See Comments)    Unknown reaction (possible rash)   Codeine Palpitations   Morphine And Codeine Palpitations    PHYSICAL EXAM:  Performance status (ECOG): 0 - Asymptomatic  Vitals:   12/07/22 1422  BP: (!) 149/83  Pulse: 73  Resp: 18  Temp: (!) 97.5 F (36.4 C)  SpO2: 99%   Wt Readings from Last 3 Encounters:  12/07/22 164 lb  (74.4 kg)  10/13/22 162 lb (73.5 kg)  08/24/22 159 lb 9.6 oz (72.4 kg)   Physical Exam Vitals reviewed.  Constitutional:      Appearance: Normal appearance.  Cardiovascular:     Rate and Rhythm: Normal rate and regular rhythm.  Pulmonary:     Effort: Pulmonary effort is normal.     Breath sounds: Normal breath sounds.  Abdominal:     Palpations: Abdomen is soft. There is no hepatomegaly, splenomegaly or mass.  Musculoskeletal:     Right lower leg: No edema.     Left lower leg: No edema.  Lymphadenopathy:  Cervical: No cervical adenopathy.     Right cervical: No superficial cervical adenopathy.    Left cervical: No superficial cervical adenopathy.     Upper Body:     Right upper body: No supraclavicular or axillary adenopathy.     Left upper body: No supraclavicular or axillary adenopathy.  Neurological:     General: No focal deficit present.     Mental Status: He is alert and oriented to person, place, and time.  Psychiatric:        Mood and Affect: Mood normal.        Behavior: Behavior normal.     LABORATORY DATA:  I have reviewed the labs as listed.     Latest Ref Rng & Units 11/29/2022    1:48 PM 05/23/2022    9:38 AM 11/16/2021    8:20 AM  CBC  WBC 4.0 - 10.5 K/uL 30.7  21.8  25.9   Hemoglobin 13.0 - 17.0 g/dL 16.1  09.6  04.5   Hematocrit 39.0 - 52.0 % 41.9  41.8  41.7   Platelets 150 - 400 K/uL 190  200  201       Latest Ref Rng & Units 11/29/2022    1:48 PM 05/23/2022    9:38 AM 01/31/2022    9:26 AM  CMP  Glucose 70 - 99 mg/dL 60  93  93   BUN 8 - 23 mg/dL 12  9  12    Creatinine 0.61 - 1.24 mg/dL 4.09  8.11  9.14   Sodium 135 - 145 mmol/L 136  140  139   Potassium 3.5 - 5.1 mmol/L 3.8  3.9  4.0   Chloride 98 - 111 mmol/L 105  107  103   CO2 22 - 32 mmol/L 24  27  23    Calcium 8.9 - 10.3 mg/dL 8.4  9.0  9.4   Total Protein 6.5 - 8.1 g/dL 6.5  6.9  6.9   Total Bilirubin 0.3 - 1.2 mg/dL 0.9  1.3  0.9   Alkaline Phos 38 - 126 U/L 64  56  72   AST 15 -  41 U/L 22  22  17    ALT 0 - 44 U/L 19  17  16        Component Value Date/Time   RBC 4.38 11/29/2022 1348   MCV 95.7 11/29/2022 1348   MCV 89 01/28/2021 1204   MCH 31.1 11/29/2022 1348   MCHC 32.5 11/29/2022 1348   RDW 12.5 11/29/2022 1348   RDW 12.5 01/28/2021 1204   LYMPHSABS 25.8 (H) 11/29/2022 1348   LYMPHSABS 16.5 (H) 01/28/2021 1204   MONOABS 0.3 11/29/2022 1348   EOSABS 0.0 11/29/2022 1348   EOSABS 0.1 01/28/2021 1204   BASOSABS 0.3 (H) 11/29/2022 1348   BASOSABS 0.1 01/28/2021 1204    DIAGNOSTIC IMAGING:  I have independently reviewed the scans and discussed with the patient. No results found.   ASSESSMENT:  1.  Stage 0 CLL: -Lymphocyte predominant leukocytosis since 2019 -Leukocytosis first noted 11/17/2017 with total WBC 11.9 and absolute lymphocytes 5.9 -Most recent labs reviewed from 10/01/2020, WBC 16.5 with 74% lymphocytes, absolute lymphocytes 12.1. -No anemia or thrombocytopenia to date -No palpable lymphadenopathy, splenomegaly, hepatomegaly on exam -Abdominal ultrasound obtained 09/07/2020 was negative for splenomegaly - Flow cytometry on 10/11/2020 with monoclonal B-cell population, CD5 positive, CD19, CD20 positive consistent with CLL.   2.  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  Stage 0 CLL: - Physical exam without any evidence of palpable adenopathy or splenomegaly. - Labs showed normal LFTs, LDH.  White count is 30.7, absolute lymphocyte count is 25.8.   No anemia and  thrombocytopenia. WBC and ALC oscillates over the last couple of years. No indication to start treatment as ALC has not doubled within 6 months.  -  RTC 6 months for follow-up with repeat labs and exam.  Orders placed this encounter:  No orders of the defined types were placed in this encounter.    Mathis Fare BD, Catovsky D, Caligaris-Cappio F, Dighiero G, Dhner H, [place], [place], [place] E, Chiorazzi N, Stilgenbauer S, Rai KR, [place], Eichhorst B, O'Brien S, Robak T, Seymour JF, Kipps TJ. iwCLL guidelines for diagnosis, indications for treatment, response assessment, and supportive management of CLL. Blood. 2018 Jun 21;131(25):2745-2760.  Active disease should be clearly documented to initiate therapy. At least 1 of the following criteria should be met.  1) Evidence of progressive marrow failure as manifested by the development of, or worsening of, anemia and/or thrombocytopenia. Cutoff levels of Hb <10 g/dL or platelet counts <409  109/L are generally regarded as indication for treatment. However, in some patients, platelet counts <100  109/L may remain stable over a long period; this situation does not automatically require therapeutic intervention. 2) Massive (ie, ?6 cm below the left costal margin) or progressive or symptomatic splenomegaly. 3) Massive nodes (ie, ?10 cm in longest diameter) or progressive or symptomatic lymphadenopathy. 4) Progressive lymphocytosis with an increase of ?50% over a 19-month period, or lymphocyte doubling time (LDT) <6 months. LDT can be obtained by linear regression extrapolation of absolute lymphocyte counts obtained at intervals of 2 weeks over an observation period of 2 to 3 months; patients with initial blood lymphocyte counts <30  109/L may require a longer observation period to determine the LDT. Factors contributing to lymphocytosis other than CLL (eg, infections, steroid administration) should be excluded. 5) Autoimmune complications  including anemia or thrombocytopenia poorly responsive to corticosteroids. 6) Symptomatic or functional extranodal involvement (eg, skin, kidney, lung, spine). Disease-related symptoms as defined by any of the following: Unintentional weight loss ?10% within the previous 6 months. Significant fatigue (ie, ECOG performance scale 2 or worse; cannot work or unable to perform usual activities). Fevers ?100.16F or 38.0C for 2 or more weeks without evidence of infection. Night sweats for ?1 month without evidence of infection.  Patient expressed understanding of the plan provided.   I have spent a total of 25 minutes minutes of face-to-face and non-face-to-face time, preparing to see the patient, performing a medically appropriate examination, counseling and educating the patient, documenting clinical information in the electronic health record,  and care coordination.   Georga Kaufmann PA-C Dept of Hematology and Oncology [facility]

## 2022-12-17 DIAGNOSIS — M25562 Pain in left knee: Secondary | ICD-10-CM | POA: Diagnosis not present

## 2023-01-02 ENCOUNTER — Ambulatory Visit (INDEPENDENT_AMBULATORY_CARE_PROVIDER_SITE_OTHER): Payer: Medicare Other | Admitting: Family Medicine

## 2023-01-02 ENCOUNTER — Encounter: Payer: Self-pay | Admitting: Family Medicine

## 2023-01-02 VITALS — BP 123/71 | HR 66 | Temp 98.1°F | Ht 70.0 in | Wt 164.0 lb

## 2023-01-02 DIAGNOSIS — I10 Essential (primary) hypertension: Secondary | ICD-10-CM | POA: Diagnosis not present

## 2023-01-02 DIAGNOSIS — E785 Hyperlipidemia, unspecified: Secondary | ICD-10-CM

## 2023-01-02 DIAGNOSIS — J301 Allergic rhinitis due to pollen: Secondary | ICD-10-CM

## 2023-01-02 DIAGNOSIS — M79604 Pain in right leg: Secondary | ICD-10-CM | POA: Diagnosis not present

## 2023-01-02 DIAGNOSIS — Z125 Encounter for screening for malignant neoplasm of prostate: Secondary | ICD-10-CM

## 2023-01-02 DIAGNOSIS — M79605 Pain in left leg: Secondary | ICD-10-CM | POA: Diagnosis not present

## 2023-01-02 DIAGNOSIS — Z9079 Acquired absence of other genital organ(s): Secondary | ICD-10-CM

## 2023-01-02 DIAGNOSIS — C9110 Chronic lymphocytic leukemia of B-cell type not having achieved remission: Secondary | ICD-10-CM

## 2023-01-02 MED ORDER — FLUTICASONE PROPIONATE 50 MCG/ACT NA SUSP: 2.0000 | Freq: Every day | NASAL | 2 refills | Status: AC

## 2023-01-02 NOTE — Progress Notes (Signed)
   Subjective:    Patient ID: David Pham, male    DOB: [birth year], 72 y.o.   MRN: [id number]  HPI Patient reports having put in carpeting in home approx 3 weeks ago - left knee really bothering him - using brace on it- both legs are bothering him Right low back pain pulling pain  Significant right low back pain radiates into the buttock he feels like it is into the knee and the hip but is not in the anterior hip it hurts with certain movements.  Denies numbness or tingling has a history of CLL no fevers recently  Review of Systems     Objective:   Physical Exam  General-in no acute distress Eyes-no discharge Lungs-respiratory rate normal, CTA CV-no murmurs,RRR Extremities skin warm dry no edema Neuro grossly normal Behavior normal, alert Tight hamstrings negative straight leg raise strength is good and legs      Assessment & Plan:  1. Non-seasonal allergic rhinitis due to pollen Allergy nasal spray recommended - fluticasone (FLONASE) 50 MCG/ACT nasal spray; Place 2 sprays into both nostrils daily.  Dispense: 48 g; Refill: 2  2. Primary hypertension Continue medication watch diet stay active - Basic Metabolic Panel - Microalbumin/Creatinine Ratio, Urine  3. CLL (chronic lymphocytic leukemia) (HCC) Check lab work before next visit - CBC with Differential  4. Pain in both lower extremities I feel this is due to his back issues more than likely impingement of the nerve hold off on any type of MRI currently stretches recommended - Basic Metabolic Panel  5. Hyperlipidemia, unspecified hyperlipidemia type Continue current measures healthy diet - Lipid Panel  6. Screening PSA (prostate specific antigen) Screening in the near future - Lactate Dehydrogenase  7. History of prostatectomy History of prostatectomy - Lactate Dehydrogenase He will do lab work before his follow-up visit this summer

## 2023-02-06 DIAGNOSIS — I10 Essential (primary) hypertension: Secondary | ICD-10-CM | POA: Diagnosis not present

## 2023-02-06 DIAGNOSIS — C9110 Chronic lymphocytic leukemia of B-cell type not having achieved remission: Secondary | ICD-10-CM | POA: Diagnosis not present

## 2023-02-06 DIAGNOSIS — E785 Hyperlipidemia, unspecified: Secondary | ICD-10-CM | POA: Diagnosis not present

## 2023-02-07 LAB — LIPID PANEL
Chol/HDL Ratio: 3.5 ratio (ref 0.0–5.0)
Cholesterol, Total: 156 mg/dL (ref 100–199)
HDL: 44 mg/dL (ref >39)
LDL Chol Calc (NIH): 91 mg/dL (ref 0–99)
Triglycerides: 115 mg/dL (ref 0–149)
VLDL Cholesterol Cal: 21 mg/dL (ref 5–40)

## 2023-02-07 LAB — CBC WITH DIFFERENTIAL/PLATELET
Basophils Absolute: 0.1 10*3/uL (ref 0.0–0.2)
Basos: 0 %
EOS (ABSOLUTE): 0.1 10*3/uL (ref 0.0–0.4)
Eos: 0 %
Hematocrit: 41.8 % (ref 37.5–51.0)
Hemoglobin: 14.1 g/dL (ref 13.0–17.7)
Immature Grans (Abs): 0 10*3/uL (ref 0.0–0.1)
Immature Granulocytes: 0 %
Lymphocytes Absolute: 21 10*3/uL — ABNORMAL HIGH (ref 0.7–3.1)
Lymphs: 81 %
MCH: 31.4 pg (ref 26.6–33.0)
MCHC: 33.7 g/dL (ref 31.5–35.7)
MCV: 93 fL (ref 79–97)
Monocytes Absolute: 0.7 10*3/uL (ref 0.1–0.9)
Monocytes: 3 %
Neutrophils Absolute: 4 10*3/uL (ref 1.4–7.0)
Neutrophils: 16 %
Platelets: 211 10*3/uL (ref 150–450)
RBC: 4.49 x10E6/uL (ref 4.14–5.80)
RDW: 12.9 % (ref 11.6–15.4)
WBC: 25.9 10*3/uL (ref 3.4–10.8)

## 2023-02-07 LAB — BASIC METABOLIC PANEL
BUN/Creatinine Ratio: 11 (ref 10–24)
BUN: 9 mg/dL (ref 8–27)
CO2: 23 mmol/L (ref 20–29)
Calcium: 9 mg/dL (ref 8.6–10.2)
Chloride: 103 mmol/L (ref 96–106)
Creatinine, Ser: 0.81 mg/dL (ref 0.76–1.27)
Glucose: 100 mg/dL — ABNORMAL HIGH (ref 70–99)
Potassium: 4.5 mmol/L (ref 3.5–5.2)
Sodium: 141 mmol/L (ref 134–144)
eGFR: 94 mL/min/{1.73_m2} (ref >59)

## 2023-02-07 LAB — LACTATE DEHYDROGENASE: LDH: 162 IU/L (ref 121–224)

## 2023-02-07 LAB — MICROALBUMIN / CREATININE URINE RATIO
Creatinine, Urine: 240.5 mg/dL
Microalb/Creat Ratio: 5 mg/g creat (ref 0–29)
Microalbumin, Urine: 11.2 ug/mL

## 2023-02-08 ENCOUNTER — Encounter: Payer: Medicare Other | Admitting: Family Medicine

## 2023-02-13 ENCOUNTER — Encounter: Payer: Self-pay | Admitting: Family Medicine

## 2023-02-13 ENCOUNTER — Ambulatory Visit (HOSPITAL_COMMUNITY)
Admission: RE | Admit: 2023-02-13 | Discharge: 2023-02-13 | Disposition: A | Discharge status: Home / Self Care | Payer: Medicare Other | Source: Ambulatory Visit | Attending: Family Medicine | Admitting: Family Medicine

## 2023-02-13 ENCOUNTER — Ambulatory Visit (INDEPENDENT_AMBULATORY_CARE_PROVIDER_SITE_OTHER): Payer: Medicare Other | Admitting: Family Medicine

## 2023-02-13 VITALS — BP 107/69 | HR 78 | Ht 67.75 in | Wt 162.4 lb

## 2023-02-13 DIAGNOSIS — R053 Chronic cough: Secondary | ICD-10-CM

## 2023-02-13 DIAGNOSIS — C9110 Chronic lymphocytic leukemia of B-cell type not having achieved remission: Secondary | ICD-10-CM | POA: Insufficient documentation

## 2023-02-13 DIAGNOSIS — R0982 Postnasal drip: Secondary | ICD-10-CM

## 2023-02-13 DIAGNOSIS — I10 Essential (primary) hypertension: Secondary | ICD-10-CM

## 2023-02-13 DIAGNOSIS — H9313 Tinnitus, bilateral: Secondary | ICD-10-CM | POA: Diagnosis not present

## 2023-02-13 DIAGNOSIS — Z0000 Encounter for general adult medical examination without abnormal findings: Secondary | ICD-10-CM

## 2023-02-13 DIAGNOSIS — Z0001 Encounter for general adult medical examination with abnormal findings: Secondary | ICD-10-CM

## 2023-02-13 MED ORDER — AZELASTINE HCL 0.1 % NA SOLN: 2.0000 | Freq: Two times a day (BID) | NASAL | 12 refills | Status: AC

## 2023-02-13 NOTE — Progress Notes (Signed)
   Subjective:    Patient ID: David Pham, male    DOB: 1951/03/22, 72 y.o.   MRN: [id number]  HPI The patient comes in today for a wellness visit.  Here today for wellness Tries to eat healthy and stay active Has underlying CLL Sees hematology regular basis Does relate he feels like he is doing fairly well.  Takes his medicine regular basis.  Denies dizziness drowsiness or fatigue  A review of their health history was completed.  A review of medications was also completed.  Any needed refills; no  Eating habits: fair   Falls/  MVA accidents in past few months: no  Regular exercise: push ups, yard work   Barrister's clerk pt sees on regular basis: urologist, dermatologist  Preventative health issues were discussed.    Review of Systems     Objective:   Physical Exam  General-in no acute distress Eyes-no discharge Lungs-respiratory rate normal, CTA CV-no murmurs,RRR Extremities skin warm dry no edema Neuro grossly normal Behavior normal, alert       Assessment & Plan:  1. Well adult exam Adult wellness-complete.wellness physical was conducted today. Importance of diet and exercise were discussed in detail.  Importance of stress reduction and healthy living were discussed.  In addition to this a discussion regarding safety was also covered.  We also reviewed over immunizations and gave recommendations regarding current immunization needed for age.   In addition to this additional areas were also touched on including: Preventative health exams needed:  Colonoscopy 2032  Patient was advised yearly wellness exam   2. CLL (chronic lymphocytic leukemia) (HCC) Followed by specialist - DG Chest 2 View  3. Post-nasal drip Frequent postnasal drip with a frequent cough the frequent cough is him clearing his throat this is not down from in the lungs more than likely is postnasal drainage causing this patient on acid blocker we will try Astelin nasal spray along with  Allegra  4. Primary hypertension Blood pressure on the low end of normal but we talked about coming off the medicine patient hesitant he is not drowsy or dizzy with the medicine therefore maintain as is  Patient with tinnitus issues not severe we did discuss protecting hearing  This is been present for years

## 2023-02-13 NOTE — Patient Instructions (Signed)
Results for orders placed or performed in visit on 01/02/23  Lactate Dehydrogenase  Result Value Ref Range   LDH 162 121 - 224 IU/L  CBC with Differential  Result Value Ref Range   WBC 25.9 (HH) 3.4 - 10.8 x10E3/uL   RBC 4.49 4.14 - 5.80 x10E6/uL   Hemoglobin 14.1 13.0 - 17.7 g/dL   Hematocrit 16.1 09.6 - 51.0 %   MCV 93 79 - 97 fL   MCH 31.4 26.6 - 33.0 pg   MCHC 33.7 31.5 - 35.7 g/dL   RDW 04.5 40.9 - 81.1 %   Platelets 211 150 - 450 x10E3/uL   Neutrophils 16 Not Estab. %   Lymphs 81 Not Estab. %   Monocytes 3 Not Estab. %   Eos 0 Not Estab. %   Basos 0 Not Estab. %   Neutrophils Absolute 4.0 1.4 - 7.0 x10E3/uL   Lymphocytes Absolute 21.0 (H) 0.7 - 3.1 x10E3/uL   Monocytes Absolute 0.7 0.1 - 0.9 x10E3/uL   EOS (ABSOLUTE) 0.1 0.0 - 0.4 x10E3/uL   Basophils Absolute 0.1 0.0 - 0.2 x10E3/uL   Immature Granulocytes 0 Not Estab. %   Immature Grans (Abs) 0.0 0.0 - 0.1 x10E3/uL  Lipid Panel  Result Value Ref Range   Cholesterol, Total 156 100 - 199 mg/dL   Triglycerides 914 0 - 149 mg/dL   HDL 44 >78 mg/dL   VLDL Cholesterol Cal 21 5 - 40 mg/dL   LDL Chol Calc (NIH) 91 0 - 99 mg/dL   Chol/HDL Ratio 3.5 0.0 - 5.0 ratio  Basic Metabolic Panel  Result Value Ref Range   Glucose 100 (H) 70 - 99 mg/dL   BUN 9 8 - 27 mg/dL   Creatinine, Ser 2.95 0.76 - 1.27 mg/dL   eGFR 94 >62 ZH/YQM/5.78   BUN/Creatinine Ratio 11 10 - 24   Sodium 141 134 - 144 mmol/L   Potassium 4.5 3.5 - 5.2 mmol/L   Chloride 103 96 - 106 mmol/L   CO2 23 20 - 29 mmol/L   Calcium 9.0 8.6 - 10.2 mg/dL  Microalbumin/Creatinine Ratio, Urine  Result Value Ref Range   Creatinine, Urine 240.5 Not Estab. mg/dL   Microalbumin, Urine 46.9 Not Estab. ug/mL   Microalb/Creat Ratio 5 0 - 29 mg/g creat

## 2023-02-19 ENCOUNTER — Ambulatory Visit
Admission: EM | Admit: 2023-02-19 | Discharge: 2023-02-19 | Disposition: A | Discharge status: Home / Self Care | Payer: Medicare Other | Attending: Nurse Practitioner | Admitting: Nurse Practitioner

## 2023-02-19 DIAGNOSIS — Z23 Encounter for immunization: Secondary | ICD-10-CM

## 2023-02-19 DIAGNOSIS — S91331A Puncture wound without foreign body, right foot, initial encounter: Secondary | ICD-10-CM

## 2023-02-19 MED ORDER — TETANUS-DIPHTH-ACELL PERTUSSIS 5-2.5-18.5 LF-MCG/0.5 IM SUSY
0.5000 mL | PREFILLED_SYRINGE | Freq: Once | INTRAMUSCULAR | Status: AC
  Administered 2023-02-19: 0.5 mL via INTRAMUSCULAR

## 2023-02-19 NOTE — Discharge Instructions (Signed)
Your tetanus shot was updated today.  It is good for the next 10 years. Cleanse the areas with an antibacterial soap such as Dial gold bar soap. May take over-the-counter Tylenol as needed for pain or discomfort. Continue to monitor the areas on the bottom of the right foot.  If you notice increased redness, swelling, drainage, or other concerns, please follow-up in this clinic or with your primary care physician immediately for further evaluation. Follow-up as needed.

## 2023-02-19 NOTE — ED Provider Notes (Signed)
[facility]    CSN: [id number] Arrival date & time: 02/19/23  1327      History   Chief Complaint Chief Complaint  Patient presents with   Foot Injury    HPI David Pham is a 72 y.o. male.   The history is provided by the patient.   The patient presents after he stepped on 2 nails with the right foot.  Patient states that the nails were 2 or 3 inches long.  The nails did pierce through his boot.  He has puncture wounds on the bottom of the right foot and the right heel and between the great toe and second toe.  Patient states the areas are mildly tender to palpation.  He denies fever, chills, swelling of the right foot, drainage from the right foot, or inability to ambulate.  Patient reports that he needs to get his tetanus shot updated. Past Medical History:  Diagnosis Date   Cancer (HCC) 09/2009   prostate   Hemorrhoids    Incontinence of feces    Rectal pain    Toenail fungus 03/28/2020   I would not recommend oral medication due to liver toxicity potential, treating topically   White coat hypertension     Patient Active Problem List   Diagnosis Date Noted   CLL (chronic lymphocytic leukemia) (HCC) 10/13/2022   Viral respiratory infection 09/08/2021   Hyperlipidemia 01/28/2021   Fatty liver 10/01/2020   Toenail fungus 03/28/2020   Pain in left shoulder 02/18/2020   Seasonal and perennial allergic rhinitis 11/28/2019   Insect sting allergy 11/28/2019   Chronic throat clearing 09/29/2019   Mass of right knee 02/21/2017   HTN (hypertension) 11/06/2016   Left shoulder tendinitis 11/06/2016   Spondylolisthesis at L5-S1 level 09/23/2015   History of prostatectomy 05/11/2014   COLONIC POLYPS 06/01/2008   Allergic rhinitis 06/01/2008   ESOPHAGEAL REFLUX 11/29/2007    Past Surgical History:  Procedure Laterality Date   BIOPSY  11/17/2020   Procedure: BIOPSY;  Surgeon: Dolores Frame, MD;  [facility]: [facility];  Service:  Gastroenterology;;   COLONOSCOPY  2006 and 2012   COLONOSCOPY  08/04/2010   COLONOSCOPY WITH PROPOFOL N/A 11/17/2020   Procedure: COLONOSCOPY WITH PROPOFOL;  Surgeon: Dolores Frame, MD;  [facility]: [facility];  Service: Gastroenterology;  Laterality: N/A;  830   ESOPHAGOGASTRODUODENOSCOPY (EGD) WITH PROPOFOL N/A 11/17/2020   Procedure: ESOPHAGOGASTRODUODENOSCOPY (EGD) WITH PROPOFOL;  Surgeon: Dolores Frame, MD;  [facility]: [facility];  Service: Gastroenterology;  Laterality: N/A;   PROSTATECTOMY     2011       Home Medications    Prior to Admission medications   Medication Sig Start Date End Date Taking? Authorizing Provider  acetaminophen (TYLENOL) 650 MG CR tablet Take 650 mg by mouth every 8 (eight) hours as needed for pain.   Yes [provider]  amLODipine (NORVASC) 2.5 MG tablet Take 1 tablet (2.5 mg total) by mouth daily. 10/13/22  Yes Luking, Jonna Coup, MD  azelastine (ASTELIN) 0.1 % nasal spray Place 2 sprays into both nostrils 2 (two) times daily. 02/13/23  Yes Luking, Jonna Coup, MD  Camphor-Menthol-Methyl Sal (SALONPAS) 3.07-29-08 % PTCH Place 1 patch onto the skin daily as needed (back pain).   Yes [provider]  Cholecalciferol (VITAMIN D3) 50 MCG (2000 UT) TABS Take 2,000 Units by mouth in the morning.   Yes [provider]  fexofenadine (ALLEGRA) 180 MG tablet Take 180 mg by mouth daily.  Yes [provider]  fluticasone (FLONASE) 50 MCG/ACT nasal spray Place 2 sprays into both nostrils daily. 01/02/23  Yes Babs Sciara, MD  Multiple Vitamins-Minerals (MULTIVITAMIN WITH MINERALS) tablet Take 1 tablet by mouth daily.   Yes [provider]  omeprazole (PRILOSEC) 20 MG capsule TAKE 1 CAPSULE BY MOUTH DAILY 08/24/22  Yes Luking, Jonna Coup, MD  Polyethyl Glycol-Propyl Glycol (SYSTANE) 0.4-0.3 % SOLN Place 1-2 drops into both eyes 3 (three) times daily as needed (dry/irritated eyes.).   Yes [provider]   Polyvinyl Alcohol-Povidone (REFRESH OP) Place 1-2 drops into both eyes 3 (three) times daily as needed (dry/irritated eyes.).   Yes [provider]  tadalafil (CIALIS) 20 MG tablet Take 0.5-1 tablets (10-20 mg total) by mouth every other day as needed for erectile dysfunction. 10/13/22  Yes Luking, Jonna Coup, MD  vitamin B-12 (CYANOCOBALAMIN) 1000 MCG tablet Take 1,000 mcg by mouth in the morning.   Yes [provider]    Family History Family History  Problem Relation Age of Onset   Cancer Sister        breast   Hypertension Brother    Diabetes Brother    Emphysema Mother    Asthma Father    Allergic rhinitis Father    Angioedema Neg Hx    Atopy Neg Hx    Eczema Neg Hx    Immunodeficiency Neg Hx    Urticaria Neg Hx     Social History Social History   Tobacco Use   Smoking status: Never   Smokeless tobacco: Current    Types: Snuff  Vaping Use   Vaping status: Never Used  Substance Use Topics   Alcohol use: Never   Drug use: Never     Allergies   Penicillins, Codeine, and Morphine and codeine   Review of Systems Review of Systems Per HPI  Physical Exam Triage Vital Signs ED Triage Vitals  Encounter Vitals Group     BP 02/19/23 1340 133/88     Systolic BP Percentile --      Diastolic BP Percentile --      Pulse Rate 02/19/23 1340 75     Resp 02/19/23 1340 16     Temp 02/19/23 1340 97.9 F (36.6 C)     Temp Source 02/19/23 1340 Oral     SpO2 02/19/23 1340 93 %     Weight --      Height --      Head Circumference --      Peak Flow --      Pain Score 02/19/23 1344 3     Pain Loc --      Pain Education --      Exclude from Growth Chart --    No data found.  Updated Vital Signs BP 133/88 (BP Location: Right Arm)   Pulse 75   Temp 97.9 F (36.6 C) (Oral)   Resp 16   SpO2 93%   Visual Acuity Right Eye Distance:   Left Eye Distance:   Bilateral Distance:    Right Eye Near:   Left Eye Near:    Bilateral Near:     Physical  Exam Vitals and nursing note reviewed.  Constitutional:      General: He is not in acute distress.    Appearance: Normal appearance.  HENT:     Head: Normocephalic.  Eyes:     Extraocular Movements: Extraocular movements intact.     Pupils: Pupils are equal, round, and reactive to  light.  Pulmonary:     Effort: Pulmonary effort is normal.  Musculoskeletal:     Cervical back: Normal range of motion.       Feet:  Feet:     Right foot:     Skin integrity: Skin integrity normal.     Toenail Condition: Right toenails are normal.     Comments: Puncture wounds noted to the bottom of the right foot between the great toe and the second toe, and of the right heel.  Areas are mildly tender to palpation.  There is no oozing, fluctuance, or drainage present. Lymphadenopathy:     Cervical: No cervical adenopathy.  Skin:    General: Skin is warm and dry.  Neurological:     General: No focal deficit present.     Mental Status: He is alert and oriented to person, place, and time.  Psychiatric:        Mood and Affect: Mood normal.        Behavior: Behavior normal.      UC Treatments / Results  Labs (all labs ordered are listed, but only abnormal results are displayed) Labs Reviewed - No data to display  EKG   Radiology No results found.  Procedures Procedures (including critical care time)  Medications Ordered in UC Medications  Tdap (BOOSTRIX) injection 0.5 mL (has no administration in time range)    Initial Impression / Assessment and Plan / UC Course  I have reviewed the triage vital signs and the nursing notes.  Pertinent labs & imaging results that were available during my care of the patient were reviewed by me and considered in my medical decision making (see chart for details).  The patient is well-appearing, he is in no acute distress, vital signs are stable.  Patient with 2 puncture wounds to the right foot.  Tdap was updated today.  Will defer antibiotics at this  time as there no signs of infection. Patient advised to keep the areas clean and dry and to monitor for signs of infection.  Supportive care recommendations were provided and discussed with the patient to include use of an antibacterial soap such as Dial gold bar soap to clean the areas, and over-the-counter analgesics for pain or discomfort.  Patient advised to follow-up immediately if he experiences worsening pain, foul-smelling drainage, increased redness, or swelling.  Patient is in agreement with this plan of care and verbalizes understanding.  All questions were answered.  Patient stable for discharge.   Final Clinical Impressions(s) / UC Diagnoses   Final diagnoses:  Puncture wound of right foot, initial encounter  Need for vaccination     Discharge Instructions      Your tetanus shot was updated today.  It is good for the next 10 years. Cleanse the areas with an antibacterial soap such as Dial gold bar soap. May take over-the-counter Tylenol as needed for pain or discomfort. Continue to monitor the areas on the bottom of the right foot.  If you notice increased redness, swelling, drainage, or other concerns, please follow-up in this clinic or with your primary care physician immediately for further evaluation. Follow-up as needed.     ED Prescriptions   None    PDMP not reviewed this encounter.   Abran Cantor, NP 02/19/23 1404

## 2023-02-19 NOTE — ED Triage Notes (Signed)
Pt states he stepped on a nail twice today on right foot. Pt states he is due for tetanus shot.

## 2023-02-20 ENCOUNTER — Encounter: Payer: Self-pay | Admitting: Family Medicine

## 2023-02-20 NOTE — Progress Notes (Signed)
Please mail to patient

## 2023-02-23 ENCOUNTER — Ambulatory Visit (INDEPENDENT_AMBULATORY_CARE_PROVIDER_SITE_OTHER): Payer: Medicare Other

## 2023-02-23 ENCOUNTER — Other Ambulatory Visit: Payer: Self-pay | Admitting: Family Medicine

## 2023-02-23 VITALS — BP 133/88 | Ht 70.0 in | Wt 162.6 lb

## 2023-02-23 DIAGNOSIS — Z0000 Encounter for general adult medical examination without abnormal findings: Secondary | ICD-10-CM

## 2023-02-23 NOTE — Progress Notes (Signed)
 Because this visit was a virtual/telehealth visit,  certain criteria was not obtained, such a blood pressure, CBG if patient is a diabetic, and timed up and go. Any medications not marked as "taking" was not mentioned during the medication reconciliation part of the visit. Any vitals not documented were not able to be obtained due to this being a telehealth visit. Vitals documented are verbally provided by the patient.   Per patient no change in vitals since last visit, unable to obtain new vitals due to telehealth visit.   Subjective:   David Pham is a 72 y.o. male who presents for Medicare Annual/Subsequent preventive examination.  Visit Complete: Virtual  I connected with  David Pham on 02/23/23 by a audio enabled telemedicine application and verified that I am speaking with the correct person using two identifiers.  Patient Location: Home  Provider Location: Home Office  I discussed the limitations of evaluation and management by telemedicine. The patient expressed understanding and agreed to proceed.  Patient Medicare AWV questionnaire was completed by the patient on n/a; I have confirmed that all information answered by patient is correct and no changes since this date.  Review of Systems     Cardiac Risk Factors include: advanced age (>52men, >68 women);dyslipidemia;hypertension;male gender;smoking/ tobacco exposure     Objective:    Today's Vitals   02/23/23 1429 02/23/23 1431  BP: 133/88   Weight: 162 lb 10.2 oz (73.8 kg)   Height: 5\' 10"  (1.778 m)   PainSc:  0-No pain   Body mass index is 23.34 kg/m.     02/23/2023    2:34 PM 12/07/2022    2:23 PM 07/19/2022    8:12 AM 05/30/2022    8:01 AM 11/23/2021    8:11 AM 05/17/2021    3:06 PM 02/08/2021   10:31 AM  Advanced Directives  Does Patient Have a Medical Advance Directive? No No Yes No No No No  Would patient like information on creating a medical advance directive? No - Patient declined No - Patient  declined  No - Patient declined No - Patient declined No - Patient declined No - Patient declined    Current Medications (verified) Outpatient Encounter Medications as of 02/23/2023  Medication Sig   acetaminophen (TYLENOL) 650 MG CR tablet Take 650 mg by mouth every 8 (eight) hours as needed for pain.   amLODipine (NORVASC) 2.5 MG tablet Take 1 tablet (2.5 mg total) by mouth daily.   azelastine (ASTELIN) 0.1 % nasal spray Place 2 sprays into both nostrils 2 (two) times daily.   Camphor-Menthol-Methyl Sal (SALONPAS) 3.07-29-08 % PTCH Place 1 patch onto the skin daily as needed (back pain).   Cholecalciferol (VITAMIN D3) 50 MCG (2000 UT) TABS Take 2,000 Units by mouth in the morning.   fexofenadine (ALLEGRA) 180 MG tablet Take 180 mg by mouth daily.   fluticasone (FLONASE) 50 MCG/ACT nasal spray Place 2 sprays into both nostrils daily.   Multiple Vitamins-Minerals (MULTIVITAMIN WITH MINERALS) tablet Take 1 tablet by mouth daily.   omeprazole (PRILOSEC) 20 MG capsule TAKE 1 CAPSULE BY MOUTH DAILY   Polyethyl Glycol-Propyl Glycol (SYSTANE) 0.4-0.3 % SOLN Place 1-2 drops into both eyes 3 (three) times daily as needed (dry/irritated eyes.).   Polyvinyl Alcohol-Povidone (REFRESH OP) Place 1-2 drops into both eyes 3 (three) times daily as needed (dry/irritated eyes.).   tadalafil (CIALIS) 20 MG tablet Take 0.5-1 tablets (10-20 mg total) by mouth every other day as needed for erectile dysfunction.   vitamin  B-12 (CYANOCOBALAMIN) 1000 MCG tablet Take 1,000 mcg by mouth in the morning.   No facility-administered encounter medications on file as of 02/23/2023.    Allergies (verified) Penicillins, Codeine, and Morphine and codeine   History: Past Medical History:  Diagnosis Date   Cancer (HCC) 09/2009   prostate   Hemorrhoids    Incontinence of feces    Rectal pain    Toenail fungus 03/28/2020   I would not recommend oral medication due to liver toxicity potential, treating topically   White coat  hypertension    Past Surgical History:  Procedure Laterality Date   BIOPSY  11/17/2020   Procedure: BIOPSY;  Surgeon: Dolores Frame, MD;  [facility]: [facility];  Service: Gastroenterology;;   COLONOSCOPY  2006 and 2012   COLONOSCOPY  08/04/2010   COLONOSCOPY WITH PROPOFOL N/A 11/17/2020   Procedure: COLONOSCOPY WITH PROPOFOL;  Surgeon: Dolores Frame, MD;  [facility];  Service: Gastroenterology;  Laterality: N/A;  830   ESOPHAGOGASTRODUODENOSCOPY (EGD) WITH PROPOFOL N/A 11/17/2020   Procedure: ESOPHAGOGASTRODUODENOSCOPY (EGD) WITH PROPOFOL;  Surgeon: Dolores Frame, MD;  [facility];  Service: Gastroenterology;  Laterality: N/A;   PROSTATECTOMY     2011   Family History  Problem Relation Age of Onset   Cancer Sister        breast   Hypertension Brother    Diabetes Brother    Emphysema Mother    Asthma Father    Allergic rhinitis Father    Angioedema Neg Hx    Atopy Neg Hx    Eczema Neg Hx    Immunodeficiency Neg Hx    Urticaria Neg Hx    Social History   Socioeconomic History   Marital status: Single    Spouse name: Not on file   Number of children: Not on file   Years of education: Not on file   Highest education level: Not on file  Occupational History   Not on file  Tobacco Use   Smoking status: Never   Smokeless tobacco: Current    Types: Snuff  Vaping Use   Vaping status: Never Used  Substance and Sexual Activity   Alcohol use: Never   Drug use: Never   Sexual activity: Not on file  Other Topics Concern   Not on file  Social History Narrative   Not on file   Social Determinants of Health   Financial Resource Strain: Low Risk  (02/23/2023)   Overall Financial Resource Strain (CARDIA)    Difficulty of Paying Living Expenses: Not hard at all  Food Insecurity: No Food Insecurity (02/23/2023)   Hunger Vital Sign    Worried About Running Out of Food in the Last Year: Never true    Ran Out of Food in the  Last Year: Never true  Transportation Needs: No Transportation Needs (02/23/2023)   PRAPARE - Administrator, Civil Service (Medical): No    Lack of Transportation (Non-Medical): No  Physical Activity: Sufficiently Active (02/23/2023)   Exercise Vital Sign    Days of Exercise per Week: 7 days    Minutes of Exercise per Session: 30 min  Stress: No Stress Concern Present (02/23/2023)   [employer] of Occupational Health - Occupational Stress Questionnaire    Feeling of Stress : Not at all  Social Connections: Moderately Isolated (02/23/2023)   Social Connection and Isolation Panel [NHANES]    Frequency of Communication with Friends and Family: More than three times a week  Frequency of Social Gatherings with Friends and Family: More than three times a week    Attends Religious Services: More than 4 times per year    Active Member of [employer] or Organizations: No    Attends Engineer, structural: Never    Marital Status: Divorced    Tobacco Counseling Ready to quit: Yes Counseling given: Yes   Clinical Intake:  Pre-visit preparation completed: Yes  Pain : No/denies pain Pain Score: 0-No pain     BMI - recorded: 23.34 Nutritional Status: BMI of 19-24  Normal Nutritional Risks: None Diabetes: No  How often do you need to have someone help you when you read instructions, pamphlets, or other written materials from your doctor or pharmacy?: 1 - Never  Interpreter Needed?: No  Information entered by ::  , CMA   Activities of Daily Living    02/23/2023    2:33 PM  In your present state of health, do you have any difficulty performing the following activities:  Hearing? 0  Vision? 0  Difficulty concentrating or making decisions? 0  Walking or climbing stairs? 0  Dressing or bathing? 0  Doing errands, shopping? 0  Preparing Food and eating ? N  Using the Toilet? N  In the past six months, have you accidently leaked urine? N  Do you have  problems with loss of bowel control? N  Managing your Medications? N  Managing your Finances? N  Housekeeping or managing your Housekeeping? N    Patient Care Team: Babs Sciara, MD as PCP - General (Family Medicine)  Indicate any recent Medical Services you may have received from other than [facility] providers in the past year (date may be approximate).     Assessment:   This is a routine wellness examination for [place].  Hearing/Vision screen Hearing Screening - Comments:: Patient denies any hearing difficulties.    Dietary issues and exercise activities discussed:     Goals Addressed             This Visit's Progress    Patient Stated       To stay healthy       Depression Screen    02/23/2023    2:35 PM 02/13/2023    3:07 PM 01/02/2023    4:13 PM 10/13/2022    3:01 PM 07/10/2022    2:54 PM 02/06/2022    9:05 AM 08/08/2021   11:30 AM  PHQ 2/9 Scores  PHQ - 2 Score 0 0 1 0 0 0 0  PHQ- 9 Score 2 2 1  0 2      Fall Risk    02/23/2023    2:35 PM 10/13/2022    3:01 PM 07/10/2022    2:55 PM 02/06/2022    9:01 AM 08/08/2021   11:29 AM  Fall Risk   Falls in the past year? 0 0 0 0 0  Number falls in past yr: 0 0 0 0 0  Injury with Fall? 0 0 0 0 0  Risk for fall due to : No Fall Risks No Fall Risks No Fall Risks No Fall Risks No Fall Risks  Follow up Falls prevention discussed Falls evaluation completed Follow up appointment  Falls evaluation completed    MEDICARE RISK AT HOME:  Medicare Risk at Home - 02/23/23 1435     Any stairs in or around the home? No    If so, are there any without handrails? No    Home free of loose throw rugs  in walkways, pet beds, electrical cords, etc? Yes    Adequate lighting in your home to reduce risk of falls? Yes    Life alert? No    Use of a cane, walker or w/c? No    Grab bars in the bathroom? No    Shower chair or bench in shower? No    Elevated toilet seat or a handicapped toilet? No             TIMED UP AND GO:  Was  the test performed?  No    Cognitive Function:        02/23/2023    2:35 PM  6CIT Screen  What Year? 0 points  What month? 0 points  What time? 0 points  Count back from 20 0 points  Months in reverse 0 points  Repeat phrase 0 points  Total Score 0 points    Immunizations Immunization History  Administered Date(s) Administered   Fluad Quad(high Dose 65+) 05/01/2022   Influenza Whole 05/08/2012   Influenza, High Dose Seasonal PF 05/24/2018, 05/30/2019, 05/02/2021   Influenza, Seasonal, Injecte, Preservative Fre 05/02/2016   Influenza,inj,Quad PF,6+ Mos 05/11/2014, 05/12/2015   Influenza,inj,quad, With Preservative 04/23/2017   Influenza-Unspecified 05/07/2013, 05/08/2017, 07/24/2017   Moderna Sars-Covid-2 Vaccination 09/04/2019, 10/03/2019   Pneumococcal Conjugate-13 11/01/2015   Pneumococcal Polysaccharide-23 08/13/2007, 06/01/2008, 11/06/2016   Td 07/26/2007   Tdap 04/22/2009, 02/19/2023    TDAP status: Up to date  Flu Vaccine status: Due, Education has been provided regarding the importance of this vaccine. Advised may receive this vaccine at local pharmacy or Health Dept. Aware to provide a copy of the vaccination record if obtained from local pharmacy or Health Dept. Verbalized acceptance and understanding.  Pneumococcal vaccine status: Up to date  Covid-19 vaccine status: Information provided on how to obtain vaccines.   Qualifies for Shingles Vaccine? Yes   Zostavax completed No   Shingrix Completed?: No.    Education has been provided regarding the importance of this vaccine. Patient has been advised to call insurance company to determine out of pocket expense if they have not yet received this vaccine. Advised may also receive vaccine at local pharmacy or Health Dept. Verbalized acceptance and understanding.  Screening Tests Health Maintenance  Topic Date Due   Zoster Vaccines- Shingrix (1 of 2) Never done   Medicare Annual Wellness (AWV)  02/07/2023    INFLUENZA VACCINE  02/22/2023   Colonoscopy  11/18/2030   DTaP/Tdap/Td (4 - Td or Tdap) 02/18/2033   Pneumonia Vaccine 80+ Years old  Completed   Hepatitis C Screening  Completed   HPV VACCINES  Aged Out   COVID-19 Vaccine  Discontinued    Health Maintenance  Health Maintenance Due  Topic Date Due   Zoster Vaccines- Shingrix (1 of 2) Never done   Medicare Annual Wellness (AWV)  02/07/2023   INFLUENZA VACCINE  02/22/2023    Colorectal cancer screening: Type of screening: Colonoscopy. Completed 11/17/2020. Repeat every 10 years  Lung Cancer Screening: (Low Dose CT Chest recommended if Age 49-80 years, 20 pack-year currently smoking OR have quit w/in 15years.) does not qualify.   Additional Screening:  Hepatitis C Screening: does not qualify; Completed 01/10/2019  Vision Screening: Recommended annual ophthalmology exams for early detection of glaucoma and other disorders of the eye. Is the patient up to date with their annual eye exam?  Yes  Who is the provider or what is the name of the office in which the patient attends annual eye exams? [facility] If pt is not established with a provider, would they like to be referred to a provider to establish care? No .   Dental Screening: Recommended annual dental exams for proper oral hygiene  Diabetic Foot Exam: n/a  Community Resource Referral / Chronic Care Management: CRR required this visit?  No   CCM required this visit?  No     Plan:     I have personally reviewed and noted the following in the patient's chart:   Medical and social history Use of alcohol, tobacco or illicit drugs  Current medications and supplements including opioid prescriptions. Patient is not currently taking opioid prescriptions. Functional ability and status Nutritional status Physical activity Advanced directives List of other physicians Hospitalizations, surgeries, and ER visits in previous 12 months Vitals Screenings to include  cognitive, depression, and falls Referrals and appointments  In addition, I have reviewed and discussed with patient certain preventive protocols, quality metrics, and best practice recommendations. A written personalized care plan for preventive services as well as general preventive health recommendations were provided to patient.     Jordan Hawks , CMA   02/23/2023   After Visit Summary: (Mail) Due to this being a telephonic visit, the after visit summary with patients personalized plan was offered to patient via mail   Nurse Notes: Pleasant patient

## 2023-02-23 NOTE — Patient Instructions (Signed)
David Pham , Thank you for taking time to come for your Medicare Wellness Visit. I appreciate your ongoing commitment to your health goals. Please review the following plan we discussed and let me know if I can assist you in the future.   These are the goals we discussed:  Goals      Patient Stated     To stay healthy        This is a list of the screening recommended for you and due dates:  Health Maintenance  Topic Date Due   Zoster (Shingles) Vaccine (1 of 2) Never done   Flu Shot  02/22/2023   Medicare Annual Wellness Visit  02/23/2024   Colon Cancer Screening  11/18/2030   DTaP/Tdap/Td vaccine (4 - Td or Tdap) 02/18/2033   Pneumonia Vaccine  Completed   Hepatitis C Screening  Completed   HPV Vaccine  Aged Out   COVID-19 Vaccine  Discontinued    Advanced directives: Advance directive discussed with you today. Even though you declined this today, please call our office should you change your mind, and we can give you the proper paperwork for you to fill out. Advance care planning is a way to make decisions about medical care that fits your values in case you are ever unable to make these decisions for yourself.  Information on Advanced Care Planning can be found at [facility] of [facility] Advance Health Care Directives Advance Health Care Directives ([email or web address]     Conditions/risks identified: You are due for the vaccines checked below. You may have these done at your preferred pharmacy. Please have them fax the office proof of the vaccines so that we can update your chart.   [x]  Flu (due annually) [x]  Shingrix (Shingles vaccine) []  Pneumonia Vaccines []  TDAP (Tetanus) Vaccine every 10 years []  Covid-19    Next appointment: VIRTUAL/ TELEPHONE VISIT Follow up in one year for your annual wellness visit  February 29, 2024 at 1:30pm telephone visit   Preventive Care 19 Years and Older, Male  Preventive care refers to lifestyle choices and visits with your health  care provider that can promote health and wellness. What does preventive care include? A yearly physical exam. This is also called an annual well check. Dental exams once or twice a year. Routine eye exams. Ask your health care provider how often you should have your eyes checked. Personal lifestyle choices, including: Daily care of your teeth and gums. Regular physical activity. Eating a healthy diet. Avoiding tobacco and drug use. Limiting alcohol use. Practicing safe sex. Taking low doses of aspirin every day. Taking vitamin and mineral supplements as recommended by your health care provider. What happens during an annual well check? The services and screenings done by your health care provider during your annual well check will depend on your age, overall health, lifestyle risk factors, and family history of disease. Counseling  Your health care provider may ask you questions about your: Alcohol use. Tobacco use. Drug use. Emotional well-being. Home and relationship well-being. Sexual activity. Eating habits. History of falls. Memory and ability to understand (cognition). Work and work Astronomer. Screening  You may have the following tests or measurements: Height, weight, and BMI. Blood pressure. Lipid and cholesterol levels. These may be checked every 5 years, or more frequently if you are over 43 years old. Skin check. Lung cancer screening. You may have this screening every year starting at age 30 if you have a 30-pack-year history of smoking and currently smoke  or have quit within the past 15 years. Fecal occult blood test (FOBT) of the stool. You may have this test every year starting at age 24. Flexible sigmoidoscopy or colonoscopy. You may have a sigmoidoscopy every 5 years or a colonoscopy every 10 years starting at age 68. Prostate cancer screening. Recommendations will vary depending on your family history and other risks. Hepatitis C blood test. Hepatitis B  blood test. Sexually transmitted disease (STD) testing. Diabetes screening. This is done by checking your blood sugar (glucose) after you have not eaten for a while (fasting). You may have this done every 1-3 years. Abdominal aortic aneurysm (AAA) screening. You may need this if you are a current or former smoker. Osteoporosis. You may be screened starting at age 35 if you are at high risk. Talk with your health care provider about your test results, treatment options, and if necessary, the need for more tests. Vaccines  Your health care provider may recommend certain vaccines, such as: Influenza vaccine. This is recommended every year. Tetanus, diphtheria, and acellular pertussis (Tdap, Td) vaccine. You may need a Td booster every 10 years. Zoster vaccine. You may need this after age 68. Pneumococcal 13-valent conjugate (PCV13) vaccine. One dose is recommended after age 6. Pneumococcal polysaccharide (PPSV23) vaccine. One dose is recommended after age 99. Talk to your health care provider about which screenings and vaccines you need and how often you need them. This information is not intended to replace advice given to you by your health care provider. Make sure you discuss any questions you have with your health care provider. Document Released: 08/06/2015 Document Revised: 03/29/2016 Document Reviewed: 05/11/2015 Elsevier Interactive Patient Education  2017 [employer].  Fall Prevention in the Home Falls can cause injuries. They can happen to people of all ages. There are many things you can do to make your home safe and to help prevent falls. What can I do on the outside of my home? Regularly fix the edges of walkways and driveways and fix any cracks. Remove anything that might make you trip as you walk through a door, such as a raised step or threshold. Trim any bushes or trees on the path to your home. Use bright outdoor lighting. Clear any walking paths of anything that might make  someone trip, such as rocks or tools. Regularly check to see if handrails are loose or broken. Make sure that both sides of any steps have handrails. Any raised decks and porches should have guardrails on the edges. Have any leaves, snow, or ice cleared regularly. Use sand or salt on walking paths during winter. Clean up any spills in your garage right away. This includes oil or grease spills. What can I do in the bathroom? Use night lights. Install grab bars by the toilet and in the tub and shower. Do not use towel bars as grab bars. Use non-skid mats or decals in the tub or shower. If you need to sit down in the shower, use a plastic, non-slip stool. Keep the floor dry. Clean up any water that spills on the floor as soon as it happens. Remove soap buildup in the tub or shower regularly. Attach bath mats securely with double-sided non-slip rug tape. Do not have throw rugs and other things on the floor that can make you trip. What can I do in the bedroom? Use night lights. Make sure that you have a light by your bed that is easy to reach. Do not use any sheets or blankets  that are too big for your bed. They should not hang down onto the floor. Have a firm chair that has side arms. You can use this for support while you get dressed. Do not have throw rugs and other things on the floor that can make you trip. What can I do in the kitchen? Clean up any spills right away. Avoid walking on wet floors. Keep items that you use a lot in easy-to-reach places. If you need to reach something above you, use a strong step stool that has a grab bar. Keep electrical cords out of the way. Do not use floor polish or wax that makes floors slippery. If you must use wax, use non-skid floor wax. Do not have throw rugs and other things on the floor that can make you trip. What can I do with my stairs? Do not leave any items on the stairs. Make sure that there are handrails on both sides of the stairs and  use them. Fix handrails that are broken or loose. Make sure that handrails are as long as the stairways. Check any carpeting to make sure that it is firmly attached to the stairs. Fix any carpet that is loose or worn. Avoid having throw rugs at the top or bottom of the stairs. If you do have throw rugs, attach them to the floor with carpet tape. Make sure that you have a light switch at the top of the stairs and the bottom of the stairs. If you do not have them, ask someone to add them for you. What else can I do to help prevent falls? Wear shoes that: Do not have high heels. Have rubber bottoms. Are comfortable and fit you well. Are closed at the toe. Do not wear sandals. If you use a stepladder: Make sure that it is fully opened. Do not climb a closed stepladder. Make sure that both sides of the stepladder are locked into place. Ask someone to hold it for you, if possible. Clearly mark and make sure that you can see: Any grab bars or handrails. First and last steps. Where the edge of each step is. Use tools that help you move around (mobility aids) if they are needed. These include: Canes. Walkers. Scooters. Crutches. Turn on the lights when you go into a dark area. Replace any light bulbs as soon as they burn out. Set up your furniture so you have a clear path. Avoid moving your furniture around. If any of your floors are uneven, fix them. If there are any pets around you, be aware of where they are. Review your medicines with your doctor. Some medicines can make you feel dizzy. This can increase your chance of falling. Ask your doctor what other things that you can do to help prevent falls. This information is not intended to replace advice given to you by your health care provider. Make sure you discuss any questions you have with your health care provider. Document Released: 05/06/2009 Document Revised: 12/16/2015 Document Reviewed: 08/14/2014 Elsevier Interactive Patient  Education  2017 [employer].

## 2023-03-14 DIAGNOSIS — L821 Other seborrheic keratosis: Secondary | ICD-10-CM | POA: Diagnosis not present

## 2023-03-14 DIAGNOSIS — D225 Melanocytic nevi of trunk: Secondary | ICD-10-CM | POA: Diagnosis not present

## 2023-03-14 DIAGNOSIS — Z1283 Encounter for screening for malignant neoplasm of skin: Secondary | ICD-10-CM | POA: Diagnosis not present

## 2023-03-21 ENCOUNTER — Ambulatory Visit (INDEPENDENT_AMBULATORY_CARE_PROVIDER_SITE_OTHER): Payer: Medicare Other | Admitting: Family Medicine

## 2023-03-21 VITALS — BP 128/76 | HR 71 | Temp 98.4°F | Ht 70.0 in | Wt 167.4 lb

## 2023-03-21 DIAGNOSIS — S91331S Puncture wound without foreign body, right foot, sequela: Secondary | ICD-10-CM

## 2023-03-21 DIAGNOSIS — S91331D Puncture wound without foreign body, right foot, subsequent encounter: Secondary | ICD-10-CM

## 2023-03-21 DIAGNOSIS — S91339A Puncture wound without foreign body, unspecified foot, initial encounter: Secondary | ICD-10-CM | POA: Insufficient documentation

## 2023-03-21 NOTE — Patient Instructions (Signed)
Heel cup if needed.  No evidence of infection.  Take care  Dr. Adriana Simas

## 2023-03-21 NOTE — Progress Notes (Signed)
Subjective:  Patient ID: David Pham, male    DOB: 1950-08-06  Age: 72 y.o. MRN: [id number]  CC: Chief Complaint  Patient presents with   Puncture Wound    Right foot stepped on nail on 07/29, went to urgent care would like to follow up    HPI:  72 year old male presents for evaluation of the above.  Patient suffered 2 puncture wounds to the right foot on 7/29.  Was evaluated at local urgent care and was given a tetanus vaccine.  No antibiotic therapy was given as there was no evidence of infection.  Patient reports that puncture wound on his heel is still tender.  This is concerning for him.  He would like me to evaluate this today.  Patient Active Problem List   Diagnosis Date Noted   Puncture wound of foot 03/21/2023   CLL (chronic lymphocytic leukemia) (HCC) 10/13/2022   Hyperlipidemia 01/28/2021   Fatty liver 10/01/2020   Toenail fungus 03/28/2020   Seasonal and perennial allergic rhinitis 11/28/2019   Insect sting allergy 11/28/2019   Chronic throat clearing 09/29/2019   Mass of right knee 02/21/2017   HTN (hypertension) 11/06/2016   Spondylolisthesis at L5-S1 level 09/23/2015   History of prostatectomy 05/11/2014   Allergic rhinitis 06/01/2008   ESOPHAGEAL REFLUX 11/29/2007    Social Hx   Social History   Socioeconomic History   Marital status: Single    Spouse name: Not on file   Number of children: Not on file   Years of education: Not on file   Highest education level: Not on file  Occupational History   Not on file  Tobacco Use   Smoking status: Never   Smokeless tobacco: Current    Types: Snuff  Vaping Use   Vaping status: Never Used  Substance and Sexual Activity   Alcohol use: Never   Drug use: Never   Sexual activity: Not on file  Other Topics Concern   Not on file  Social History Narrative   Not on file   Social Determinants of Health   Financial Resource Strain: Low Risk  (02/23/2023)   Overall Financial Resource Strain (CARDIA)     Difficulty of Paying Living Expenses: Not hard at all  Food Insecurity: No Food Insecurity (02/23/2023)   Hunger Vital Sign    Worried About Running Out of Food in the Last Year: Never true    Ran Out of Food in the Last Year: Never true  Transportation Needs: No Transportation Needs (02/23/2023)   PRAPARE - Administrator, Civil Service (Medical): No    Lack of Transportation (Non-Medical): No  Physical Activity: Sufficiently Active (02/23/2023)   Exercise Vital Sign    Days of Exercise per Week: 7 days    Minutes of Exercise per Session: 30 min  Stress: No Stress Concern Present (02/23/2023)   [employer] of Occupational Health - Occupational Stress Questionnaire    Feeling of Stress : Not at all  Social Connections: Moderately Isolated (02/23/2023)   Social Connection and Isolation Panel [NHANES]    Frequency of Communication with Friends and Family: More than three times a week    Frequency of Social Gatherings with Friends and Family: More than three times a week    Attends Religious Services: More than 4 times per year    Active Member of [employer] or Organizations: No    Attends Banker Meetings: Never    Marital Status: Divorced    Review of Systems  Per HPI  Objective:  BP 128/76   Pulse 71   Temp 98.4 F (36.9 C)   Ht 5\' 10"  (1.778 m)   Wt 167 lb 6.4 oz (75.9 kg)   SpO2 97%   BMI 24.02 kg/m      03/21/2023    8:38 AM 02/23/2023    2:29 PM 02/19/2023    1:40 PM  BP/Weight  Systolic BP 128 133 133  Diastolic BP 76 88 88  Wt. (Lbs) 167.4 162.64   BMI 24.02 kg/m2 23.34 kg/m2     Physical Exam Constitutional:      General: He is not in acute distress. HENT:     Head: Normocephalic and atraumatic.  Eyes:     General:        Right eye: No discharge.        Left eye: No discharge.     Conjunctiva/sclera: Conjunctivae normal.  Musculoskeletal:       Feet:  Feet:     Comments: There is no open wound.  Area of concern is firm to the  touch but there is no palpable foreign body.  No surrounding erythema.  No warmth. Neurological:     Mental Status: He is alert.  Psychiatric:        Mood and Affect: Mood normal.        Behavior: Behavior normal.     Lab Results  Component Value Date   WBC 25.9 (HH) 02/06/2023   HGB 14.1 02/06/2023   HCT 41.8 02/06/2023   PLT 211 02/06/2023   GLUCOSE 100 (H) 02/06/2023   CHOL 156 02/06/2023   TRIG 115 02/06/2023   HDL 44 02/06/2023   LDLCALC 91 02/06/2023   ALT 19 11/29/2022   AST 22 11/29/2022   NA 141 02/06/2023   K 4.5 02/06/2023   CL 103 02/06/2023   CREATININE 0.81 02/06/2023   BUN 9 02/06/2023   CO2 23 02/06/2023   TSH 1.690 10/23/2015   PSA 5.14 (H) 03/22/2009     Assessment & Plan:   Problem List Items Addressed This Visit       Other   Puncture wound of foot - Primary    No evidence of infection.  No appreciable foreign body.  Advised supportive care.  Heel cup if needed.       Follow-up:  Return if symptoms worsen or fail to improve.  Everlene Other DO [facility]

## 2023-03-21 NOTE — Assessment & Plan Note (Signed)
No evidence of infection.  No appreciable foreign body.  Advised supportive care.  Heel cup if needed.

## 2023-03-22 ENCOUNTER — Ambulatory Visit: Payer: Medicare Other | Admitting: Nurse Practitioner

## 2023-04-14 DIAGNOSIS — R058 Other specified cough: Secondary | ICD-10-CM | POA: Diagnosis not present

## 2023-05-09 ENCOUNTER — Other Ambulatory Visit: Payer: Self-pay | Admitting: Family Medicine

## 2023-05-28 ENCOUNTER — Other Ambulatory Visit: Payer: Self-pay

## 2023-05-28 DIAGNOSIS — C9110 Chronic lymphocytic leukemia of B-cell type not having achieved remission: Secondary | ICD-10-CM

## 2023-05-28 NOTE — Progress Notes (Signed)
Lab orders entered

## 2023-05-29 ENCOUNTER — Inpatient Hospital Stay: Payer: Medicare Other | Attending: Hematology

## 2023-05-29 ENCOUNTER — Ambulatory Visit: Payer: Medicare Other | Admitting: Family Medicine

## 2023-05-29 VITALS — BP 134/87 | HR 77 | Temp 98.2°F | Ht 70.0 in | Wt 162.0 lb

## 2023-05-29 DIAGNOSIS — F17290 Nicotine dependence, other tobacco product, uncomplicated: Secondary | ICD-10-CM | POA: Diagnosis not present

## 2023-05-29 DIAGNOSIS — Z808 Family history of malignant neoplasm of other organs or systems: Secondary | ICD-10-CM | POA: Diagnosis not present

## 2023-05-29 DIAGNOSIS — C9110 Chronic lymphocytic leukemia of B-cell type not having achieved remission: Secondary | ICD-10-CM | POA: Insufficient documentation

## 2023-05-29 DIAGNOSIS — R0982 Postnasal drip: Secondary | ICD-10-CM | POA: Diagnosis not present

## 2023-05-29 DIAGNOSIS — Z8546 Personal history of malignant neoplasm of prostate: Secondary | ICD-10-CM | POA: Diagnosis not present

## 2023-05-29 LAB — COMPREHENSIVE METABOLIC PANEL
ALT: 18 U/L (ref 0–44)
AST: 21 U/L (ref 15–41)
Albumin: 4 g/dL (ref 3.5–5.0)
Alkaline Phosphatase: 66 U/L (ref 38–126)
Anion gap: 8 (ref 5–15)
BUN: 12 mg/dL (ref 8–23)
CO2: 25 mmol/L (ref 22–32)
Calcium: 8.5 mg/dL — ABNORMAL LOW (ref 8.9–10.3)
Chloride: 103 mmol/L (ref 98–111)
Creatinine, Ser: 0.86 mg/dL (ref 0.61–1.24)
GFR, Estimated: >60 mL/min (ref >60)
Glucose, Bld: 107 mg/dL — ABNORMAL HIGH (ref 70–99)
Potassium: 3.6 mmol/L (ref 3.5–5.1)
Sodium: 136 mmol/L (ref 135–145)
Total Bilirubin: 0.7 mg/dL (ref <1.2)
Total Protein: 6.8 g/dL (ref 6.5–8.1)

## 2023-05-29 LAB — CBC WITH DIFFERENTIAL/PLATELET
Abs Immature Granulocytes: 0 10*3/uL (ref 0.00–0.07)
Basophils Absolute: 0 10*3/uL (ref 0.0–0.1)
Basophils Relative: 0 %
Eosinophils Absolute: 0 10*3/uL (ref 0.0–0.5)
Eosinophils Relative: 0 %
HCT: 41.5 % (ref 39.0–52.0)
Hemoglobin: 13.3 g/dL (ref 13.0–17.0)
Lymphocytes Relative: 67 %
Lymphs Abs: 11.2 10*3/uL — ABNORMAL HIGH (ref 0.7–4.0)
MCH: 30.2 pg (ref 26.0–34.0)
MCHC: 32 g/dL (ref 30.0–36.0)
MCV: 94.3 fL (ref 80.0–100.0)
Monocytes Absolute: 0.7 10*3/uL (ref 0.1–1.0)
Monocytes Relative: 4 %
Neutro Abs: 4.8 10*3/uL (ref 1.7–7.7)
Neutrophils Relative %: 29 %
Platelets: 169 10*3/uL (ref 150–400)
RBC: 4.4 MIL/uL (ref 4.22–5.81)
RDW: 13 % (ref 11.5–15.5)
WBC: 16.7 10*3/uL — ABNORMAL HIGH (ref 4.0–10.5)
nRBC: 0 % (ref 0.0–0.2)

## 2023-05-29 LAB — LACTATE DEHYDROGENASE: LDH: 129 U/L (ref 98–192)

## 2023-05-29 MED ORDER — DOXYCYCLINE HYCLATE 100 MG PO TABS: 100.0000 mg | ORAL_TABLET | Freq: Two times a day (BID) | ORAL | 0 refills | Status: DC

## 2023-05-29 MED ORDER — IPRATROPIUM BROMIDE 0.06 % NA SOLN: 2.0000 | Freq: Four times a day (QID) | NASAL | 0 refills | Status: DC | PRN

## 2023-05-29 NOTE — Assessment & Plan Note (Signed)
Patient's symptoms likely secondary to postnasal drip.  Continue Allegra.  Trial of Atrovent.  Patient worried that he has an underlying infection.  Advised him that I do not feel like antibiotic treatment is necessary at this time.  Wait-and-see prescription given for doxycycline in case he fails to improve or worsens.

## 2023-05-29 NOTE — Patient Instructions (Signed)
Medication as prescribed.  If no improvement, start the antibiotic.  Take care  Dr. Adriana Simas

## 2023-05-29 NOTE — Progress Notes (Signed)
Subjective:  Patient ID: David Pham, male    DOB: Jul 12, 1951  Age: 72 y.o. MRN: [id number]  CC:   Chief Complaint  Patient presents with   throat clearing    Cough, raw throat and tickle in throat, drainage, neck is sore , check in ears     HPI:  72 year old male presents for evaluation of the above.   Patient has prior history of chronic throat clearing.  He states that for the past few days he has had worsening symptoms.  Postnasal drip, coughing with mucus.  He has not taken his allergy medication today.  He typically uses [employer].  He is also prescribed azelastine.  No reports of heartburn.  He does take omeprazole daily.  No fever.  No relieving factors.  Patient Active Problem List   Diagnosis Date Noted   Postnasal drip 05/29/2023   CLL (chronic lymphocytic leukemia) (HCC) 10/13/2022   Hyperlipidemia 01/28/2021   Fatty liver 10/01/2020   Seasonal and perennial allergic rhinitis 11/28/2019   Insect sting allergy 11/28/2019   Chronic throat clearing 09/29/2019   HTN (hypertension) 11/06/2016   Spondylolisthesis at L5-S1 level 09/23/2015   History of prostatectomy 05/11/2014   ESOPHAGEAL REFLUX 11/29/2007    Social Hx   Social History   Socioeconomic History   Marital status: Single    Spouse name: Not on file   Number of children: Not on file   Years of education: Not on file   Highest education level: Not on file  Occupational History   Not on file  Tobacco Use   Smoking status: Never   Smokeless tobacco: Current    Types: Snuff  Vaping Use   Vaping status: Never Used  Substance and Sexual Activity   Alcohol use: Never   Drug use: Never   Sexual activity: Not on file  Other Topics Concern   Not on file  Social History Narrative   Not on file   Social Determinants of Health   Financial Resource Strain: Low Risk  (02/23/2023)   Overall Financial Resource Strain (CARDIA)    Difficulty of Paying Living Expenses: Not hard at all  Food  Insecurity: No Food Insecurity (02/23/2023)   Hunger Vital Sign    Worried About Running Out of Food in the Last Year: Never true    Ran Out of Food in the Last Year: Never true  Transportation Needs: No Transportation Needs (02/23/2023)   PRAPARE - Administrator, Civil Service (Medical): No    Lack of Transportation (Non-Medical): No  Physical Activity: Sufficiently Active (02/23/2023)   Exercise Vital Sign    Days of Exercise per Week: 7 days    Minutes of Exercise per Session: 30 min  Stress: No Stress Concern Present (02/23/2023)   [employer] of Occupational Health - Occupational Stress Questionnaire    Feeling of Stress : Not at all  Social Connections: Moderately Isolated (02/23/2023)   Social Connection and Isolation Panel [NHANES]    Frequency of Communication with Friends and Family: More than three times a week    Frequency of Social Gatherings with Friends and Family: More than three times a week    Attends Religious Services: More than 4 times per year    Active Member of [employer] or Organizations: No    Attends Banker Meetings: Never    Marital Status: Divorced    Review of Systems Per HPI  Objective:  BP 134/87   Pulse  77   Temp 98.2 F (36.8 C)   Ht 5\' 10"  (1.778 m)   Wt 162 lb (73.5 kg)   SpO2 97%   BMI 23.24 kg/m      05/29/2023    2:47 PM 03/21/2023    8:38 AM 02/23/2023    2:29 PM  BP/Weight  Systolic BP 134 128 133  Diastolic BP 87 76 88  Wt. (Lbs) 162 167.4 162.64  BMI 23.24 kg/m2 24.02 kg/m2 23.34 kg/m2    Physical Exam  Lab Results  Component Value Date   WBC 16.7 (H) 05/29/2023   HGB 13.3 05/29/2023   HCT 41.5 05/29/2023   PLT 169 05/29/2023   GLUCOSE 107 (H) 05/29/2023   CHOL 156 02/06/2023   TRIG 115 02/06/2023   HDL 44 02/06/2023   LDLCALC 91 02/06/2023   ALT 18 05/29/2023   AST 21 05/29/2023   NA 136 05/29/2023   K 3.6 05/29/2023   CL 103 05/29/2023   CREATININE 0.86 05/29/2023   BUN 12 05/29/2023   CO2  25 05/29/2023   TSH 1.690 10/23/2015   PSA 5.14 (H) 03/22/2009     Assessment & Plan:   Problem List Items Addressed This Visit       Other   Postnasal drip - Primary    Patient's symptoms likely secondary to postnasal drip.  Continue Allegra.  Trial of Atrovent.  Patient worried that he has an underlying infection.  Advised him that I do not feel like antibiotic treatment is necessary at this time.  Wait-and-see prescription given for doxycycline in case he fails to improve or worsens.       Meds ordered this encounter  Medications   ipratropium (ATROVENT) 0.06 % nasal spray    Sig: Place 2 sprays into both nostrils 4 (four) times daily as needed for rhinitis.    Dispense:  15 mL    Refill:  0   doxycycline (VIBRA-TABS) 100 MG tablet    Sig: Take 1 tablet (100 mg total) by mouth 2 (two) times daily. Take with food and water.    Dispense:  14 tablet    Refill:  0   Ramonita Koenig DO [facility]

## 2023-06-04 NOTE — Progress Notes (Unsigned)
[facility] [address], [place] [address]   CLINIC:  Medical Oncology/Hematology  PCP:  Babs Sciara, MD [address] [place] [address] [phone]   REASON FOR VISIT:  Follow-up for CLL  PRIOR THERAPY: None  CURRENT THERAPY: Surveillance  INTERVAL HISTORY:   David Pham 72 y.o. male returns for routine follow-up of CLL.  He was last seen by Georga Kaufmann, PA-C on 12/07/2022.  At today's visit, he reports feeling fairly well.  He reports that overall he is doing well without any new or concerning symptoms  His energy is stable and he continues to complete all his ADLs on his own.  He denies any weight loss.  He denies any fever, chills, night sweats.  He denies any palpable lumps or bumps.   No abnormal rashes.  He reports some postnasal drip and cough, and was recently placed on doxycycline for possible (low suspicion) URI.  No other infections in the past 6 months.  He has 75% energy and 100% appetite. He endorses that he is maintaining a stable weight.  ASSESSMENT & PLAN:  1.  Stage 0 CLL - Lymphocyte predominant leukocytosis since 2019 - CLL diagnosed in March 2022: Pathologist smear review: Absolute lymphocytosis, smudge cells on differential Flow cytometry on 10/11/2020 with monoclonal B-cell population, CD5 positive, CD19, CD20 positive consistent with CLL. - Rai stage 0 (lymphocytosis alone, no lymphadenopathy or palpable hepatosplenomegaly, no anemia, no thrombocytopenia) - low risk, average overall survival greater than 150 months - Abdominal ultrasound obtained 09/07/2020 was negative for splenomegaly - No anemia or thrombocytopenia to date - Most recent labs (05/29/2023): WBC 16.7, absolute lymphocytes 11.2.  Normal Hgb and platelets.  Normal LDH, LFTs, creatinine. - No B symptoms or severe fatigue. - Physical exam without any evidence of palpable adenopathy or splenomegaly. - PLAN: WBC and ALC trending down, have oscillated  over the last few years.  No indication to start treatment as ALC has not doubled within 6 months and patient is otherwise asymptomatic. - RTC 6 months for follow-up with repeat labs and exam. - Discussed with patient that he should still receive age-appropriate non-live vaccinations such as yearly flu vaccine, COVID-vaccine, pneumococcal conjugate, RSV, and herpes zoster vaccination.  Live attenuated vaccines would be contraindicated due to risk of disseminated disease.  2.  Personal/family history - Currently retired; previously worked in Runner, broadcasting/film/video for 20 years, plus several years working for [employer] - No known excessive pesticide or chemical exposure - Personal history of prostate cancer with total prostatectomy in 2011, no prior history of chemotherapy or radiation - Patient has sister with unspecified leukocytosis, another sister with melanoma, no other family history of cancer - History of alcohol use, no longer drinks alcohol - Uses chewing tobacco/snuff, non-smoker    PLAN SUMMARY: >> Labs in 6 months = CBC/D, CMP, LDH >> Same-day MD office visit in 6 months with DR. KATRAGADDA (alternating MD/APP visits)     REVIEW OF SYSTEMS:   Review of Systems  Constitutional:  Negative for appetite change, chills, diaphoresis, fatigue, fever and unexpected weight change.  HENT:   Negative for lump/mass and nosebleeds.   Eyes:  Negative for eye problems.  Respiratory:  Negative for cough, hemoptysis and shortness of breath.   Cardiovascular:  Negative for chest pain, leg swelling and palpitations.  Gastrointestinal:  Negative for abdominal pain, blood in stool, constipation, diarrhea, nausea and vomiting.  Genitourinary:  Negative for hematuria.   Musculoskeletal:  Positive  for arthralgias.  Skin: Negative.   Neurological:  Negative for dizziness, headaches and light-headedness.  Hematological:  Does not bruise/bleed easily.  Psychiatric/Behavioral:  Positive for sleep  disturbance.      PHYSICAL EXAM:  ECOG PERFORMANCE STATUS: 1 - Symptomatic but completely ambulatory  Vitals:   06/05/23 1323  BP: 130/83  Pulse: 67  Resp: 16  Temp: 97.7 F (36.5 C)  SpO2: 98%   Filed Weights   06/05/23 1323  Weight: 162 lb (73.5 kg)   Physical Exam Constitutional:      Appearance: Normal appearance. He is normal weight.  Cardiovascular:     Heart sounds: Normal heart sounds.  Pulmonary:     Breath sounds: Normal breath sounds.  Abdominal:     Palpations: There is no hepatomegaly or splenomegaly.  Lymphadenopathy:     Head:     Right side of head: No submental, submandibular, tonsillar, preauricular, posterior auricular or occipital adenopathy.     Left side of head: No submental, submandibular, tonsillar, preauricular, posterior auricular or occipital adenopathy.     Cervical: No cervical adenopathy.     Right cervical: No superficial, deep or posterior cervical adenopathy.    Left cervical: No superficial, deep or posterior cervical adenopathy.     Upper Body:     Right upper body: No supraclavicular, axillary or pectoral adenopathy.     Left upper body: No supraclavicular, axillary, pectoral or epitrochlear adenopathy.     Lower Body: No right inguinal adenopathy. No left inguinal adenopathy.  Neurological:     General: No focal deficit present.     Mental Status: Mental status is at baseline.  Psychiatric:        Behavior: Behavior normal. Behavior is cooperative.     PAST MEDICAL/SURGICAL HISTORY:  Past Medical History:  Diagnosis Date   Cancer (HCC) 09/2009   prostate   Hemorrhoids    Incontinence of feces    Rectal pain    Toenail fungus 03/28/2020   I would not recommend oral medication due to liver toxicity potential, treating topically   White coat hypertension    Past Surgical History:  Procedure Laterality Date   BIOPSY  11/17/2020   Procedure: BIOPSY;  Surgeon: Dolores Frame, MD;  [facility]: [facility];  Service:  Gastroenterology;;   COLONOSCOPY  2006 and 2012   COLONOSCOPY  08/04/2010   COLONOSCOPY WITH PROPOFOL N/A 11/17/2020   Procedure: COLONOSCOPY WITH PROPOFOL;  Surgeon: Dolores Frame, MD;  Location: [facility];  Service: Gastroenterology;  Laterality: N/A;  830   ESOPHAGOGASTRODUODENOSCOPY (EGD) WITH PROPOFOL N/A 11/17/2020   Procedure: ESOPHAGOGASTRODUODENOSCOPY (EGD) WITH PROPOFOL;  Surgeon: Dolores Frame, MD;  Location: [facility];  Service: Gastroenterology;  Laterality: N/A;   PROSTATECTOMY     2011    SOCIAL HISTORY:  Social History   Socioeconomic History   Marital status: Single    Spouse name: Not on file   Number of children: Not on file   Years of education: Not on file   Highest education level: Not on file  Occupational History   Not on file  Tobacco Use   Smoking status: Never   Smokeless tobacco: Current    Types: Snuff  Vaping Use   Vaping status: Never Used  Substance and Sexual Activity   Alcohol use: Never   Drug use: Never   Sexual activity: Not on file  Other Topics Concern   Not on file  Social History Narrative   Not on  file   Social Determinants of Health   Financial Resource Strain: Low Risk  (02/23/2023)   Overall Financial Resource Strain (CARDIA)    Difficulty of Paying Living Expenses: Not hard at all  Food Insecurity: No Food Insecurity (02/23/2023)   Hunger Vital Sign    Worried About Running Out of Food in the Last Year: Never true    Ran Out of Food in the Last Year: Never true  Transportation Needs: No Transportation Needs (02/23/2023)   PRAPARE - Administrator, Civil Service (Medical): No    Lack of Transportation (Non-Medical): No  Physical Activity: Sufficiently Active (02/23/2023)   Exercise Vital Sign    Days of Exercise per Week: 7 days    Minutes of Exercise per Session: 30 min  Stress: No Stress Concern Present (02/23/2023)   [employer] of Occupational Health - Occupational Stress  Questionnaire    Feeling of Stress : Not at all  Social Connections: Moderately Isolated (02/23/2023)   Social Connection and Isolation Panel [NHANES]    Frequency of Communication with Friends and Family: More than three times a week    Frequency of Social Gatherings with Friends and Family: More than three times a week    Attends Religious Services: More than 4 times per year    Active Member of [employer] or Organizations: No    Attends Banker Meetings: Never    Marital Status: Divorced  Catering manager Violence: Not At Risk (02/23/2023)   Humiliation, Afraid, Rape, and Kick questionnaire    Fear of Current or Ex-Partner: No    Emotionally Abused: No    Physically Abused: No    Sexually Abused: No    FAMILY HISTORY:  Family History  Problem Relation Age of Onset   Cancer Sister        breast   Hypertension Brother    Diabetes Brother    Emphysema Mother    Asthma Father    Allergic rhinitis Father    Angioedema Neg Hx    Atopy Neg Hx    Eczema Neg Hx    Immunodeficiency Neg Hx    Urticaria Neg Hx     CURRENT MEDICATIONS:  Outpatient Encounter Medications as of 06/05/2023  Medication Sig   acetaminophen (TYLENOL) 650 MG CR tablet Take 650 mg by mouth every 8 (eight) hours as needed for pain.   amLODipine (NORVASC) 2.5 MG tablet TAKE 1 TABLET BY MOUTH DAILY   azelastine (ASTELIN) 0.1 % nasal spray Place 2 sprays into both nostrils 2 (two) times daily.   Camphor-Menthol-Methyl Sal (SALONPAS) 3.07-29-08 % PTCH Place 1 patch onto the skin daily as needed (back pain).   Cholecalciferol (VITAMIN D3) 50 MCG (2000 UT) TABS Take 2,000 Units by mouth in the morning.   doxycycline (VIBRA-TABS) 100 MG tablet Take 1 tablet (100 mg total) by mouth 2 (two) times daily. Take with food and water.   fexofenadine (ALLEGRA) 180 MG tablet Take 180 mg by mouth daily.   fluticasone (FLONASE) 50 MCG/ACT nasal spray Place 2 sprays into both nostrils daily.   ipratropium (ATROVENT) 0.06 %  nasal spray Place 2 sprays into both nostrils 4 (four) times daily as needed for rhinitis.   Multiple Vitamins-Minerals (MULTIVITAMIN WITH MINERALS) tablet Take 1 tablet by mouth daily.   omeprazole (PRILOSEC) 20 MG capsule TAKE 1 CAPSULE BY MOUTH DAILY   Polyethyl Glycol-Propyl Glycol (SYSTANE) 0.4-0.3 % SOLN Place 1-2 drops into both eyes 3 (three) times daily as needed (dry/irritated  eyes.).   Polyvinyl Alcohol-Povidone (REFRESH OP) Place 1-2 drops into both eyes 3 (three) times daily as needed (dry/irritated eyes.).   tadalafil (CIALIS) 20 MG tablet Take 0.5-1 tablets (10-20 mg total) by mouth every other day as needed for erectile dysfunction.   vitamin B-12 (CYANOCOBALAMIN) 1000 MCG tablet Take 1,000 mcg by mouth in the morning.   No facility-administered encounter medications on file as of 06/05/2023.    ALLERGIES:  Allergies  Allergen Reactions   Penicillins Other (See Comments)    Unknown reaction (possible rash)   Codeine Palpitations   Morphine And Codeine Palpitations    LABORATORY DATA:  I have reviewed the labs as listed.  CBC    Component Value Date/Time   WBC 16.7 (H) 05/29/2023 1345   RBC 4.40 05/29/2023 1345   HGB 13.3 05/29/2023 1345   HGB 14.1 02/06/2023 0814   HCT 41.5 05/29/2023 1345   HCT 41.8 02/06/2023 0814   PLT 169 05/29/2023 1345   PLT 211 02/06/2023 0814   MCV 94.3 05/29/2023 1345   MCV 93 02/06/2023 0814   MCH 30.2 05/29/2023 1345   MCHC 32.0 05/29/2023 1345   RDW 13.0 05/29/2023 1345   RDW 12.9 02/06/2023 0814   LYMPHSABS 11.2 (H) 05/29/2023 1345   LYMPHSABS 21.0 (H) 02/06/2023 0814   MONOABS 0.7 05/29/2023 1345   EOSABS 0.0 05/29/2023 1345   EOSABS 0.1 02/06/2023 0814   BASOSABS 0.0 05/29/2023 1345   BASOSABS 0.1 02/06/2023 0814      Latest Ref Rng & Units 05/29/2023    1:45 PM 02/06/2023    8:14 AM 11/29/2022    1:48 PM  CMP  Glucose 70 - 99 mg/dL 098  119  60   BUN 8 - 23 mg/dL 12  9  12    Creatinine 0.61 - 1.24 mg/dL 1.47  8.29   5.62   Sodium 135 - 145 mmol/L 136  141  136   Potassium 3.5 - 5.1 mmol/L 3.6  4.5  3.8   Chloride 98 - 111 mmol/L 103  103  105   CO2 22 - 32 mmol/L 25  23  24    Calcium 8.9 - 10.3 mg/dL 8.5  9.0  8.4   Total Protein 6.5 - 8.1 g/dL 6.8   6.5   Total Bilirubin <1.2 mg/dL 0.7   0.9   Alkaline Phos 38 - 126 U/L 66   64   AST 15 - 41 U/L 21   22   ALT 0 - 44 U/L 18   19     DIAGNOSTIC IMAGING:  I have independently reviewed the relevant imaging and discussed with the patient.   WRAP UP:  All questions were answered. The patient knows to call the clinic with any problems, questions or concerns.  Medical decision making: Moderate  Time spent on visit: I spent 20 minutes counseling the patient face to face. The total time spent in the appointment was 30 minutes and more than 50% was on counseling.  Carnella Guadalajara, PA-C  06/05/23 2:25 PM

## 2023-06-05 ENCOUNTER — Inpatient Hospital Stay: Payer: Medicare Other | Admitting: Physician Assistant

## 2023-06-05 VITALS — BP 130/83 | HR 67 | Temp 97.7°F | Resp 16 | Wt 162.0 lb

## 2023-06-05 DIAGNOSIS — Z808 Family history of malignant neoplasm of other organs or systems: Secondary | ICD-10-CM | POA: Diagnosis not present

## 2023-06-05 DIAGNOSIS — C9110 Chronic lymphocytic leukemia of B-cell type not having achieved remission: Secondary | ICD-10-CM

## 2023-06-05 DIAGNOSIS — F17290 Nicotine dependence, other tobacco product, uncomplicated: Secondary | ICD-10-CM | POA: Diagnosis not present

## 2023-06-05 NOTE — Patient Instructions (Signed)
[facility] at [facility] **VISIT SUMMARY & IMPORTANT INSTRUCTIONS **   You were seen today by Rojelio Brenner PA-C for your follow-up visit.    CLL (CHRONIC LYMPHOCYTIC LEUKEMIA) Your white blood cells are slightly improved. You do not show any evidence of worsening CLL at this time. We will see you for repeat labs and follow-up in 6 months.  VACCINATIONS You should still receive age-appropriate vaccinations as recommended by your primary care provider, including flu vaccine, COVID-vaccine, pneumococcal conjugate, RSV, and herpes zoster vaccination.  You should NOT receive any "live vaccines," such as MMR, varicella, rotavirus, and other less common vaccines.  FOLLOW-UP APPOINTMENT: 6 months  ** Thank you for trusting me with your healthcare!  I strive to provide all of my patients with quality care at each visit.  If you receive a survey for this visit, I would be so grateful to you for taking the time to provide feedback.  Thank you in advance!  ~ Willette Mudry                   Dr. Doreatha Massed   &   Rojelio Brenner, PA-C   - - - - - - - - - - - - - - - - - -    Thank you for choosing [facility] at [facility] to provide your oncology and hematology care.  To afford each patient quality time with our provider, please arrive at least 15 minutes before your scheduled appointment time.   If you have a lab appointment with the Cancer Center please come in thru the Main Entrance and check in at the main information desk.  You need to re-schedule your appointment should you arrive 10 or more minutes late.  We strive to give you quality time with our providers, and arriving late affects you and other patients whose appointments are after yours.  Also, if you no show three or more times for appointments you may be dismissed from the clinic at the providers discretion.     Again, thank you for choosing [facility].  Our hope is  that these requests will decrease the amount of time that you wait before being seen by our physicians.       _____________________________________________________________  Should you have questions after your visit to [facility], please contact our office at [phone] and follow the prompts.  Our office hours are 8:00 a.m. and 4:30 p.m. Monday - Friday.  Please note that voicemails left after 4:00 p.m. may not be returned until the following business day.  We are closed weekends and major holidays.  You do have access to a nurse 24-7, just call the main number to the clinic [phone] and do not press any options, hold on the line and a nurse will answer the phone.    For prescription refill requests, have your pharmacy contact our office and allow 72 hours.

## 2023-06-10 DIAGNOSIS — R0982 Postnasal drip: Secondary | ICD-10-CM | POA: Diagnosis not present

## 2023-06-10 DIAGNOSIS — R030 Elevated blood-pressure reading, without diagnosis of hypertension: Secondary | ICD-10-CM | POA: Diagnosis not present

## 2023-07-01 DIAGNOSIS — R0982 Postnasal drip: Secondary | ICD-10-CM | POA: Diagnosis not present

## 2023-07-01 DIAGNOSIS — R0981 Nasal congestion: Secondary | ICD-10-CM | POA: Diagnosis not present

## 2023-07-01 DIAGNOSIS — J069 Acute upper respiratory infection, unspecified: Secondary | ICD-10-CM | POA: Diagnosis not present

## 2023-07-27 DIAGNOSIS — J069 Acute upper respiratory infection, unspecified: Secondary | ICD-10-CM | POA: Diagnosis not present

## 2023-07-27 DIAGNOSIS — Z299 Encounter for prophylactic measures, unspecified: Secondary | ICD-10-CM | POA: Diagnosis not present

## 2023-07-27 DIAGNOSIS — K219 Gastro-esophageal reflux disease without esophagitis: Secondary | ICD-10-CM | POA: Diagnosis not present

## 2023-07-27 DIAGNOSIS — R059 Cough, unspecified: Secondary | ICD-10-CM | POA: Diagnosis not present

## 2023-07-27 DIAGNOSIS — I10 Essential (primary) hypertension: Secondary | ICD-10-CM | POA: Diagnosis not present

## 2023-08-16 ENCOUNTER — Ambulatory Visit: Payer: Medicare Other | Admitting: Family Medicine

## 2023-08-26 ENCOUNTER — Telehealth: Payer: Self-pay | Admitting: Family Medicine

## 2023-08-26 NOTE — Telephone Encounter (Signed)
Nurses Please let patient know that we are connecting with him because we received a notification from his insurance that he is on 2 separate acid blockers for his stomach  According to his insurance he is taking omeprazole-which is what we are prescribing And apparently he also received a prescription for pantoprazole from a different doctor?  The doctor's name was Dr.Shah It is quite possible this is a erroneous information from [place] [facility] but we need to make sure that the patient is not taking both PPIs.  He only needs to be on 1.  Please clarify with patient what he is taking then let us know  If he is taking both we will need to have him stop one of the PPIs but if he says he is only taking 1 PPI just document accordingly thank you  Thanks-Dr. Lorin Picket

## 2023-08-27 NOTE — Telephone Encounter (Signed)
Left a message to return the call to ask about message from provider about PPI

## 2023-08-28 NOTE — Telephone Encounter (Signed)
Spoke with the pt and he states he does not take pantoprazole anymore , he only takes omeprazole.

## 2023-08-30 DIAGNOSIS — I10 Essential (primary) hypertension: Secondary | ICD-10-CM | POA: Diagnosis not present

## 2023-08-30 DIAGNOSIS — Z6823 Body mass index (BMI) 23.0-23.9, adult: Secondary | ICD-10-CM | POA: Diagnosis not present

## 2023-08-30 DIAGNOSIS — C9110 Chronic lymphocytic leukemia of B-cell type not having achieved remission: Secondary | ICD-10-CM | POA: Diagnosis not present

## 2023-08-30 DIAGNOSIS — Z299 Encounter for prophylactic measures, unspecified: Secondary | ICD-10-CM | POA: Diagnosis not present

## 2023-08-30 DIAGNOSIS — Z7189 Other specified counseling: Secondary | ICD-10-CM | POA: Diagnosis not present

## 2023-08-30 DIAGNOSIS — R52 Pain, unspecified: Secondary | ICD-10-CM | POA: Diagnosis not present

## 2023-08-30 DIAGNOSIS — Z0000 Encounter for general adult medical examination without abnormal findings: Secondary | ICD-10-CM | POA: Diagnosis not present

## 2023-09-11 DIAGNOSIS — S134XXA Sprain of ligaments of cervical spine, initial encounter: Secondary | ICD-10-CM | POA: Diagnosis not present

## 2023-09-11 DIAGNOSIS — M9903 Segmental and somatic dysfunction of lumbar region: Secondary | ICD-10-CM | POA: Diagnosis not present

## 2023-09-11 DIAGNOSIS — M47816 Spondylosis without myelopathy or radiculopathy, lumbar region: Secondary | ICD-10-CM | POA: Diagnosis not present

## 2023-09-11 DIAGNOSIS — M9902 Segmental and somatic dysfunction of thoracic region: Secondary | ICD-10-CM | POA: Diagnosis not present

## 2023-09-11 DIAGNOSIS — S233XXA Sprain of ligaments of thoracic spine, initial encounter: Secondary | ICD-10-CM | POA: Diagnosis not present

## 2023-09-11 DIAGNOSIS — S335XXA Sprain of ligaments of lumbar spine, initial encounter: Secondary | ICD-10-CM | POA: Diagnosis not present

## 2023-09-11 DIAGNOSIS — M9901 Segmental and somatic dysfunction of cervical region: Secondary | ICD-10-CM | POA: Diagnosis not present

## 2023-10-18 DIAGNOSIS — J069 Acute upper respiratory infection, unspecified: Secondary | ICD-10-CM | POA: Diagnosis not present

## 2023-10-18 DIAGNOSIS — I10 Essential (primary) hypertension: Secondary | ICD-10-CM | POA: Diagnosis not present

## 2023-10-18 DIAGNOSIS — Z299 Encounter for prophylactic measures, unspecified: Secondary | ICD-10-CM | POA: Diagnosis not present

## 2023-10-18 DIAGNOSIS — T7840XA Allergy, unspecified, initial encounter: Secondary | ICD-10-CM | POA: Diagnosis not present

## 2023-10-29 DIAGNOSIS — Z299 Encounter for prophylactic measures, unspecified: Secondary | ICD-10-CM | POA: Diagnosis not present

## 2023-10-29 DIAGNOSIS — R0981 Nasal congestion: Secondary | ICD-10-CM | POA: Diagnosis not present

## 2023-10-29 DIAGNOSIS — R0989 Other specified symptoms and signs involving the circulatory and respiratory systems: Secondary | ICD-10-CM | POA: Diagnosis not present

## 2023-10-29 DIAGNOSIS — I10 Essential (primary) hypertension: Secondary | ICD-10-CM | POA: Diagnosis not present

## 2023-11-01 DIAGNOSIS — Z87442 Personal history of urinary calculi: Secondary | ICD-10-CM | POA: Diagnosis not present

## 2023-11-01 DIAGNOSIS — N200 Calculus of kidney: Secondary | ICD-10-CM | POA: Diagnosis not present

## 2023-11-01 DIAGNOSIS — R0982 Postnasal drip: Secondary | ICD-10-CM | POA: Diagnosis not present

## 2023-11-05 ENCOUNTER — Other Ambulatory Visit: Payer: Self-pay | Admitting: Family Medicine

## 2023-11-06 ENCOUNTER — Other Ambulatory Visit: Payer: Self-pay

## 2023-11-06 MED ORDER — AMLODIPINE BESYLATE 2.5 MG PO TABS: 2.5000 mg | ORAL_TABLET | Freq: Every day | ORAL | 2 refills | Status: AC

## 2023-11-27 DIAGNOSIS — J309 Allergic rhinitis, unspecified: Secondary | ICD-10-CM | POA: Diagnosis not present

## 2023-11-27 DIAGNOSIS — C9110 Chronic lymphocytic leukemia of B-cell type not having achieved remission: Secondary | ICD-10-CM | POA: Diagnosis not present

## 2023-11-27 DIAGNOSIS — R0789 Other chest pain: Secondary | ICD-10-CM | POA: Diagnosis not present

## 2023-11-27 DIAGNOSIS — I10 Essential (primary) hypertension: Secondary | ICD-10-CM | POA: Diagnosis not present

## 2023-11-27 DIAGNOSIS — R490 Dysphonia: Secondary | ICD-10-CM | POA: Diagnosis not present

## 2023-11-27 DIAGNOSIS — Z299 Encounter for prophylactic measures, unspecified: Secondary | ICD-10-CM | POA: Diagnosis not present

## 2023-12-04 ENCOUNTER — Inpatient Hospital Stay: Payer: Medicare Other

## 2023-12-06 ENCOUNTER — Ambulatory Visit: Payer: Self-pay | Admitting: Physician Assistant

## 2023-12-06 ENCOUNTER — Inpatient Hospital Stay: Attending: Hematology | Admitting: Oncology

## 2023-12-06 DIAGNOSIS — C9110 Chronic lymphocytic leukemia of B-cell type not having achieved remission: Secondary | ICD-10-CM

## 2023-12-06 DIAGNOSIS — Z9079 Acquired absence of other genital organ(s): Secondary | ICD-10-CM | POA: Diagnosis not present

## 2023-12-06 DIAGNOSIS — Z808 Family history of malignant neoplasm of other organs or systems: Secondary | ICD-10-CM | POA: Insufficient documentation

## 2023-12-06 DIAGNOSIS — Z8546 Personal history of malignant neoplasm of prostate: Secondary | ICD-10-CM | POA: Diagnosis not present

## 2023-12-06 LAB — CBC WITH DIFFERENTIAL/PLATELET
Abs Immature Granulocytes: 0 10*3/uL (ref 0.00–0.07)
Basophils Absolute: 0 10*3/uL (ref 0.0–0.1)
Basophils Relative: 0 %
Eosinophils Absolute: 0 10*3/uL (ref 0.0–0.5)
Eosinophils Relative: 0 %
HCT: 40.9 % (ref 39.0–52.0)
Hemoglobin: 13.2 g/dL (ref 13.0–17.0)
Lymphocytes Relative: 93 %
Lymphs Abs: 48.8 10*3/uL — ABNORMAL HIGH (ref 0.7–4.0)
MCH: 31 pg (ref 26.0–34.0)
MCHC: 32.3 g/dL (ref 30.0–36.0)
MCV: 96 fL (ref 80.0–100.0)
Monocytes Absolute: 0.5 10*3/uL (ref 0.1–1.0)
Monocytes Relative: 1 %
Neutro Abs: 3.2 10*3/uL (ref 1.7–7.7)
Neutrophils Relative %: 6 %
Platelets: 210 10*3/uL (ref 150–400)
RBC: 4.26 MIL/uL (ref 4.22–5.81)
RDW: 13 % (ref 11.5–15.5)
WBC: 52.5 10*3/uL (ref 4.0–10.5)
nRBC: 0 % (ref 0.0–0.2)

## 2023-12-06 LAB — COMPREHENSIVE METABOLIC PANEL WITH GFR
ALT: 19 U/L (ref 0–44)
AST: 21 U/L (ref 15–41)
Albumin: 3.9 g/dL (ref 3.5–5.0)
Alkaline Phosphatase: 64 U/L (ref 38–126)
Anion gap: 8 (ref 5–15)
BUN: 11 mg/dL (ref 8–23)
CO2: 24 mmol/L (ref 22–32)
Calcium: 8.7 mg/dL — ABNORMAL LOW (ref 8.9–10.3)
Chloride: 104 mmol/L (ref 98–111)
Creatinine, Ser: 0.89 mg/dL (ref 0.61–1.24)
GFR, Estimated: >60 mL/min (ref >60)
Glucose, Bld: 114 mg/dL — ABNORMAL HIGH (ref 70–99)
Potassium: 3.6 mmol/L (ref 3.5–5.1)
Sodium: 136 mmol/L (ref 135–145)
Total Bilirubin: 0.6 mg/dL (ref 0.0–1.2)
Total Protein: 7.1 g/dL (ref 6.5–8.1)

## 2023-12-06 LAB — LACTATE DEHYDROGENASE: LDH: 142 U/L (ref 98–192)

## 2023-12-06 NOTE — Progress Notes (Signed)
 CRITICAL VALUE ALERT Critical value received:  WBC 52.5 Date of notification:  12-06-23 Time of notification: 1505 Critical value read back:  Yes.   Nurse who received alert:  C. Donney Caraveo RN MD notified time and response:  Sheril Dines PA-C

## 2023-12-11 ENCOUNTER — Inpatient Hospital Stay

## 2023-12-11 ENCOUNTER — Inpatient Hospital Stay: Payer: Medicare Other | Admitting: Hematology

## 2023-12-11 VITALS — BP 142/81 | HR 64 | Temp 98.3°F | Wt 159.8 lb

## 2023-12-11 DIAGNOSIS — C9110 Chronic lymphocytic leukemia of B-cell type not having achieved remission: Secondary | ICD-10-CM | POA: Diagnosis not present

## 2023-12-11 DIAGNOSIS — Z808 Family history of malignant neoplasm of other organs or systems: Secondary | ICD-10-CM | POA: Diagnosis not present

## 2023-12-11 NOTE — Progress Notes (Signed)
 [facility] [address] [place] [address]    Clinic Day:  12/11/2023  Referring physician: Bennet Brasil, MD  Patient Care Team: Bennet Brasil, MD as PCP - General (Family Medicine)   ASSESSMENT & PLAN:   Assessment: 1.  Stage 0 CLL: -Lymphocyte predominant leukocytosis since 2019 -Leukocytosis first noted 11/17/2017 with total WBC 11.9 and absolute lymphocytes 5.9 -Most recent labs reviewed from 10/01/2020, WBC 16.5 with 74% lymphocytes, absolute lymphocytes 12.1. -No anemia or thrombocytopenia to date -No palpable lymphadenopathy, splenomegaly, hepatomegaly on exam -Abdominal ultrasound obtained 09/07/2020 was negative for splenomegaly - Flow cytometry on 10/11/2020 with monoclonal B-cell population, CD5 positive, CD19, CD20 positive consistent with CLL.   2.  Social/family history: -Currently retired; previously worked in Runner, broadcasting/film/video for 20 years, plus several years working for [employer] -No known excessive pesticide or chemical exposure -Personal history of prostate cancer with total prostatectomy in 2011, no prior history of chemotherapy or radiation -Patient has sister with unspecified leukocytosis, another sister with melanoma, no other family history of cancer -History of alcohol use, no longer drinks alcohol -Uses chewing tobacco/snuff, non-smoker  Plan: 1.  Stage 0 CLL: - Physical exam today: No palpable adenopathy.  Spleen tip palpable on deep inspiration. - Reviewed labs from 12/06/2023: White count increased to 52.5 from 16.7.  Hemoglobin and platelet count is normal.  Differential shows predominantly lymphocytosis.  LDH is normal.  LFTs are normal. - He reports that he was treated for sinus infection with 2 rounds of steroids and antibiotics.  Last steroid was about a month ago. - Today I have sent CLL FISH panel, T p53 mutation, I GVH mutation. - He does not have any B symptoms or cytopenias.  He does have rapid lymphocyte  doubling time less than 6 months.  However it is not clear if it is infection related.  I have recommended that we check him back in 2 months with repeat CBC, LDH, uric acid.  I will also obtain serum quantitative immunoglobulins.  Orders Placed This Encounter  Procedures   CBC with Differential    Standing Status:   Future    Expected Date:   01/29/2024    Expiration Date:   12/10/2024   Lactate dehydrogenase    Standing Status:   Future    Expected Date:   01/29/2024    Expiration Date:   12/10/2024   Uric acid    Standing Status:   Future    Expected Date:   01/29/2024    Expiration Date:   12/10/2024    Release to patient:   Immediate   IgG, IgA, IgM    Standing Status:   Future    Expected Date:   01/29/2024    Expiration Date:   12/10/2024      David Pham,acting as a scribe for Paulett Boros, MD.,have documented all relevant documentation on the behalf of Paulett Boros, MD,as directed by  Paulett Boros, MD while in the presence of Paulett Boros, MD.   I, Paulett Boros MD, have reviewed the above documentation for accuracy and completeness, and I agree with the above.   Paulett Boros, MD   5/20/20254:16 PM  CHIEF COMPLAINT:   Diagnosis: Stage 0 CLL   Prior Therapy: None  Current Therapy: Observation   HISTORY OF PRESENT ILLNESS:   Oncology History   No history exists.     INTERVAL HISTORY:   David Pham is a 73 y.o. male presenting to clinic today  for follow up of Stage 0 CLL. He was last seen by me on 05/30/22 and [place] PA on 06/05/23.  Today, he states that he is doing well overall. His appetite level is at 75%. His energy level is at 75%.  PAST MEDICAL HISTORY:   Past Medical History: Past Medical History:  Diagnosis Date   Cancer (HCC) 09/2009   prostate   Hemorrhoids    Incontinence of feces    Rectal pain    Toenail fungus 03/28/2020   I would not recommend oral medication due to liver toxicity potential,  treating topically   White coat hypertension     Surgical History: Past Surgical History:  Procedure Laterality Date   BIOPSY  11/17/2020   Procedure: BIOPSY;  Surgeon: Urban Garden, MD;  [facility]: [facility];  Service: Gastroenterology;;   COLONOSCOPY  2006 and 2012   COLONOSCOPY  08/04/2010   COLONOSCOPY WITH PROPOFOL  N/A 11/17/2020   Procedure: COLONOSCOPY WITH PROPOFOL ;  Surgeon: Urban Garden, MD;  Location: [facility];  Service: Gastroenterology;  Laterality: N/A;  830   ESOPHAGOGASTRODUODENOSCOPY (EGD) WITH PROPOFOL  N/A 11/17/2020   Procedure: ESOPHAGOGASTRODUODENOSCOPY (EGD) WITH PROPOFOL ;  Surgeon: Urban Garden, MD;  Location: [facility];  Service: Gastroenterology;  Laterality: N/A;   PROSTATECTOMY     2011    Social History: Social History   Socioeconomic History   Marital status: Single    Spouse name: Not on file   Number of children: Not on file   Years of education: Not on file   Highest education level: Not on file  Occupational History   Not on file  Tobacco Use   Smoking status: Never   Smokeless tobacco: Current    Types: Snuff  Vaping Use   Vaping status: Never Used  Substance and Sexual Activity   Alcohol use: Never   Drug use: Never   Sexual activity: Not on file  Other Topics Concern   Not on file  Social History Narrative   Not on file   Social Drivers of Health   Financial Resource Strain: Low Risk  (02/23/2023)   Overall Financial Resource Strain (CARDIA)    Difficulty of Paying Living Expenses: Not hard at all  Food Insecurity: No Food Insecurity (02/23/2023)   Hunger Vital Sign    Worried About Running Out of Food in the Last Year: Never true    Ran Out of Food in the Last Year: Never true  Transportation Needs: No Transportation Needs (02/23/2023)   PRAPARE - Administrator, Civil Service (Medical): No    Lack of Transportation (Non-Medical): No  Physical Activity: Sufficiently  Active (02/23/2023)   Exercise Vital Sign    Days of Exercise per Week: 7 days    Minutes of Exercise per Session: 30 min  Stress: No Stress Concern Present (02/23/2023)   [employer] of Occupational Health - Occupational Stress Questionnaire    Feeling of Stress : Not at all  Social Connections: Moderately Isolated (02/23/2023)   Social Connection and Isolation Panel [NHANES]    Frequency of Communication with Friends and Family: More than three times a week    Frequency of Social Gatherings with Friends and Family: More than three times a week    Attends Religious Services: More than 4 times per year    Active Member of [employer] or Organizations: No    Attends Banker Meetings: Never    Marital Status: Divorced  Catering manager Violence: Not  At Risk (02/23/2023)   Humiliation, Afraid, Rape, and Kick questionnaire    Fear of Current or Ex-Partner: No    Emotionally Abused: No    Physically Abused: No    Sexually Abused: No    Family History: Family History  Problem Relation Age of Onset   Cancer Sister        breast   Hypertension Brother    Diabetes Brother    Emphysema Mother    Asthma Father    Allergic rhinitis Father    Angioedema Neg Hx    Atopy Neg Hx    Eczema Neg Hx    Immunodeficiency Neg Hx    Urticaria Neg Hx     Current Medications:  Current Outpatient Medications:    acetaminophen (TYLENOL) 650 MG CR tablet, Take 650 mg by mouth every 8 (eight) hours as needed for pain., Disp: , Rfl:    amLODipine  (NORVASC ) 2.5 MG tablet, Take 1 tablet (2.5 mg total) by mouth daily., Disp: 100 tablet, Rfl: 2   azelastine  (ASTELIN ) 0.1 % nasal spray, Place 2 sprays into both nostrils 2 (two) times daily., Disp: 30 mL, Rfl: 12   Camphor-Menthol-Methyl Sal (SALONPAS) 3.07-29-08 % PTCH, Place 1 patch onto the skin daily as needed (back pain)., Disp: , Rfl:    Cholecalciferol (VITAMIN D3) 50 MCG (2000 UT) TABS, Take 2,000 Units by mouth in the morning., Disp: , Rfl:     fexofenadine  (ALLEGRA ) 180 MG tablet, Take 180 mg by mouth daily., Disp: , Rfl:    fluticasone  (FLONASE ) 50 MCG/ACT nasal spray, Place 2 sprays into both nostrils daily., Disp: 48 g, Rfl: 2   ipratropium (ATROVENT ) 0.06 % nasal spray, Place 2 sprays into both nostrils 4 (four) times daily as needed for rhinitis., Disp: 15 mL, Rfl: 0   Multiple Vitamins-Minerals (MULTIVITAMIN WITH MINERALS) tablet, Take 1 tablet by mouth daily., Disp: , Rfl:    omeprazole  (PRILOSEC) 20 MG capsule, TAKE 1 CAPSULE BY MOUTH DAILY, Disp: 100 capsule, Rfl: 2   pantoprazole  (PROTONIX ) 40 MG tablet, Take 40 mg by mouth daily., Disp: , Rfl:    Polyethyl Glycol-Propyl Glycol (SYSTANE) 0.4-0.3 % SOLN, Place 1-2 drops into both eyes 3 (three) times daily as needed (dry/irritated eyes.)., Disp: , Rfl:    Polyvinyl Alcohol-Povidone (REFRESH OP), Place 1-2 drops into both eyes 3 (three) times daily as needed (dry/irritated eyes.)., Disp: , Rfl:    tadalafil  (CIALIS ) 20 MG tablet, Take 0.5-1 tablets (10-20 mg total) by mouth every other day as needed for erectile dysfunction., Disp: 5 tablet, Rfl: 1   vitamin B-12 (CYANOCOBALAMIN) 1000 MCG tablet, Take 1,000 mcg by mouth in the morning., Disp: , Rfl:    Allergies: Allergies  Allergen Reactions   Penicillins Other (See Comments) and Hives    Unknown reaction (possible rash)   Codeine Palpitations   Morphine And Codeine Palpitations    REVIEW OF SYSTEMS:   Review of Systems  Constitutional:  Negative for chills, fatigue and fever.  HENT:   Negative for lump/mass, mouth sores, nosebleeds, sore throat and trouble swallowing.   Eyes:  Negative for eye problems.  Respiratory:  Negative for cough and shortness of breath.   Cardiovascular:  Negative for chest pain, leg swelling and palpitations.  Gastrointestinal:  Negative for abdominal pain, constipation, diarrhea, nausea and vomiting.  Genitourinary:  Negative for bladder incontinence, difficulty urinating, dysuria,  frequency, hematuria and nocturia.   Musculoskeletal:  Positive for back pain. Negative for arthralgias, flank pain, myalgias and neck  pain.  Skin:  Negative for itching and rash.  Neurological:  Negative for dizziness, headaches and numbness.  Hematological:  Does not bruise/bleed easily.  Psychiatric/Behavioral:  Negative for depression, sleep disturbance and suicidal ideas. The patient is not nervous/anxious.   All other systems reviewed and are negative.    VITALS:   Blood pressure (!) 142/81, pulse 64, temperature 98.3 F (36.8 C), temperature source Tympanic, weight 159 lb 13.3 oz (72.5 kg), SpO2 100%.  Wt Readings from Last 3 Encounters:  12/11/23 159 lb 13.3 oz (72.5 kg)  06/05/23 162 lb (73.5 kg)  05/29/23 162 lb (73.5 kg)    Body mass index is 22.93 kg/m.  Performance status (ECOG): 0 - Asymptomatic  PHYSICAL EXAM:   Physical Exam Vitals and nursing note reviewed. Exam conducted with a chaperone present.  Constitutional:      Appearance: Normal appearance.  Cardiovascular:     Rate and Rhythm: Normal rate and regular rhythm.     Pulses: Normal pulses.     Heart sounds: Normal heart sounds.  Pulmonary:     Effort: Pulmonary effort is normal.     Breath sounds: Normal breath sounds.  Abdominal:     Palpations: Abdomen is soft. There is no hepatomegaly, splenomegaly or mass.     Tenderness: There is no abdominal tenderness.  Musculoskeletal:     Right lower leg: No edema.     Left lower leg: No edema.  Lymphadenopathy:     Cervical: No cervical adenopathy.     Right cervical: No superficial, deep or posterior cervical adenopathy.    Left cervical: No superficial, deep or posterior cervical adenopathy.     Upper Body:     Right upper body: No supraclavicular or axillary adenopathy.     Left upper body: No supraclavicular or axillary adenopathy.  Neurological:     General: No focal deficit present.     Mental Status: He is alert and oriented to person, place,  and time.  Psychiatric:        Mood and Affect: Mood normal.        Behavior: Behavior normal.     LABS:   CBC     Component Value Date/Time   WBC 52.5 (HH) 12/06/2023 1323   RBC 4.26 12/06/2023 1323   HGB 13.2 12/06/2023 1323   HGB 14.1 02/06/2023 0814   HCT 40.9 12/06/2023 1323   HCT 41.8 02/06/2023 0814   PLT 210 12/06/2023 1323   PLT 211 02/06/2023 0814   MCV 96.0 12/06/2023 1323   MCV 93 02/06/2023 0814   MCH 31.0 12/06/2023 1323   MCHC 32.3 12/06/2023 1323   RDW 13.0 12/06/2023 1323   RDW 12.9 02/06/2023 0814   LYMPHSABS 48.8 (H) 12/06/2023 1323   LYMPHSABS 21.0 (H) 02/06/2023 0814   MONOABS 0.5 12/06/2023 1323   EOSABS 0.0 12/06/2023 1323   EOSABS 0.1 02/06/2023 0814   BASOSABS 0.0 12/06/2023 1323   BASOSABS 0.1 02/06/2023 0814    CMP      Component Value Date/Time   NA 136 12/06/2023 1323   NA 141 02/06/2023 0814   K 3.6 12/06/2023 1323   CL 104 12/06/2023 1323   CO2 24 12/06/2023 1323   GLUCOSE 114 (H) 12/06/2023 1323   BUN 11 12/06/2023 1323   BUN 9 02/06/2023 0814   CREATININE 0.89 12/06/2023 1323   CREATININE 0.85 10/27/2013 0703   CALCIUM 8.7 (L) 12/06/2023 1323   PROT 7.1 12/06/2023 1323   PROT 6.9 01/31/2022 0926  ALBUMIN 3.9 12/06/2023 1323   ALBUMIN 4.5 01/31/2022 0926   AST 21 12/06/2023 1323   ALT 19 12/06/2023 1323   ALKPHOS 64 12/06/2023 1323   BILITOT 0.6 12/06/2023 1323   BILITOT 0.9 01/31/2022 0926   GFRNONAA >60 12/06/2023 1323   GFRAA 89 08/02/2020 1347     No results found for: "CEA1", "CEA" / No results found for: "CEA1", "CEA" Lab Results  Component Value Date   PSA1 <0.1 01/31/2022   No results found for: "GUY403" No results found for: "CAN125"  No results found for: "TOTALPROTELP", "ALBUMINELP", "A1GS", "A2GS", "BETS", "BETA2SER", "GAMS", "MSPIKE", "SPEI" No results found for: "TIBC", "FERRITIN", "IRONPCTSAT" Lab Results  Component Value Date   LDH 142 12/06/2023   LDH 129 05/29/2023   LDH 162 02/06/2023      STUDIES:   No results found.

## 2023-12-11 NOTE — Patient Instructions (Addendum)
 [facility] at [facility] Discharge Instructions   You were seen and examined today by Dr. Cheree Cords.  He reviewed the results of your lab work which are mostly normal/stable. Your white blood cell count was high at 52,000.   We will see you back in 2 months. We will repeat lab work prior to this visit.   Return as scheduled.    Thank you for choosing [facility] at [facility] to provide your oncology and hematology care.  To afford each patient quality time with our provider, please arrive at least 15 minutes before your scheduled appointment time.   If you have a lab appointment with the Cancer Center please come in thru the Main Entrance and check in at the main information desk.  You need to re-schedule your appointment should you arrive 10 or more minutes late.  We strive to give you quality time with our providers, and arriving late affects you and other patients whose appointments are after yours.  Also, if you no show three or more times for appointments you may be dismissed from the clinic at the providers discretion.     Again, thank you for choosing [facility].  Our hope is that these requests will decrease the amount of time that you wait before being seen by our physicians.       _____________________________________________________________  Should you have questions after your visit to [facility], please contact our office at [phone] and follow the prompts.  Our office hours are 8:00 a.m. and 4:30 p.m. Monday - Friday.  Please note that voicemails left after 4:00 p.m. may not be returned until the following business day.  We are closed weekends and major holidays.  You do have access to a nurse 24-7, just call the main number to the clinic [phone] and do not press any options, hold on the line and a nurse will answer the phone.    For prescription refill requests, have your pharmacy contact our  office and allow 72 hours.    Due to Covid, you will need to wear a mask upon entering the hospital. If you do not have a mask, a mask will be given to you at the Main Entrance upon arrival. For doctor visits, patients may have 1 support person age 47 or older with them. For treatment visits, patients can not have anyone with them due to social distancing guidelines and our immunocompromised population.

## 2023-12-18 LAB — FISH HES LEUKEMIA, 4Q12 REA

## 2023-12-19 LAB — IGVH SOMATIC HYPERMUTATION

## 2023-12-20 LAB — MISC LABCORP TEST (SEND OUT): Labcorp test code: 489590

## 2024-01-28 ENCOUNTER — Inpatient Hospital Stay: Attending: Hematology

## 2024-01-28 DIAGNOSIS — C9110 Chronic lymphocytic leukemia of B-cell type not having achieved remission: Secondary | ICD-10-CM | POA: Insufficient documentation

## 2024-01-28 DIAGNOSIS — R0982 Postnasal drip: Secondary | ICD-10-CM | POA: Diagnosis not present

## 2024-01-28 DIAGNOSIS — F17290 Nicotine dependence, other tobacco product, uncomplicated: Secondary | ICD-10-CM | POA: Diagnosis not present

## 2024-01-28 DIAGNOSIS — Z8546 Personal history of malignant neoplasm of prostate: Secondary | ICD-10-CM | POA: Diagnosis not present

## 2024-01-28 DIAGNOSIS — Z803 Family history of malignant neoplasm of breast: Secondary | ICD-10-CM | POA: Insufficient documentation

## 2024-01-28 DIAGNOSIS — Z9079 Acquired absence of other genital organ(s): Secondary | ICD-10-CM | POA: Insufficient documentation

## 2024-01-28 LAB — CBC WITH DIFFERENTIAL/PLATELET
Abs Immature Granulocytes: 0.04 K/uL (ref 0.00–0.07)
Basophils Absolute: 0.1 K/uL (ref 0.0–0.1)
Basophils Relative: 0 %
Eosinophils Absolute: 0.1 K/uL (ref 0.0–0.5)
Eosinophils Relative: 1 %
HCT: 41.2 % (ref 39.0–52.0)
Hemoglobin: 13.1 g/dL (ref 13.0–17.0)
Immature Granulocytes: 0 %
Lymphocytes Relative: 86 %
Lymphs Abs: 25.8 K/uL — ABNORMAL HIGH (ref 0.7–4.0)
MCH: 30.6 pg (ref 26.0–34.0)
MCHC: 31.8 g/dL (ref 30.0–36.0)
MCV: 96.3 fL (ref 80.0–100.0)
Monocytes Absolute: 0.6 K/uL (ref 0.1–1.0)
Monocytes Relative: 2 %
Neutro Abs: 3.5 K/uL (ref 1.7–7.7)
Neutrophils Relative %: 11 %
Platelets: 199 K/uL (ref 150–400)
RBC: 4.28 MIL/uL (ref 4.22–5.81)
RDW: 12.7 % (ref 11.5–15.5)
Smear Review: NORMAL
WBC: 30.1 K/uL — ABNORMAL HIGH (ref 4.0–10.5)
nRBC: 0 % (ref 0.0–0.2)

## 2024-01-28 LAB — URIC ACID: Uric Acid, Serum: 4.3 mg/dL (ref 3.7–8.6)

## 2024-01-28 LAB — LACTATE DEHYDROGENASE: LDH: 129 U/L (ref 98–192)

## 2024-01-29 ENCOUNTER — Other Ambulatory Visit

## 2024-01-29 DIAGNOSIS — Z0000 Encounter for general adult medical examination without abnormal findings: Secondary | ICD-10-CM | POA: Diagnosis not present

## 2024-01-29 DIAGNOSIS — R52 Pain, unspecified: Secondary | ICD-10-CM | POA: Diagnosis not present

## 2024-01-29 DIAGNOSIS — Z79899 Other long term (current) drug therapy: Secondary | ICD-10-CM | POA: Diagnosis not present

## 2024-01-29 DIAGNOSIS — R490 Dysphonia: Secondary | ICD-10-CM | POA: Diagnosis not present

## 2024-01-29 DIAGNOSIS — Z299 Encounter for prophylactic measures, unspecified: Secondary | ICD-10-CM | POA: Diagnosis not present

## 2024-01-29 DIAGNOSIS — R5383 Other fatigue: Secondary | ICD-10-CM | POA: Diagnosis not present

## 2024-01-29 DIAGNOSIS — I10 Essential (primary) hypertension: Secondary | ICD-10-CM | POA: Diagnosis not present

## 2024-01-30 LAB — IGG, IGA, IGM
IgA: 129 mg/dL (ref 61–437)
IgG (Immunoglobin G), Serum: 1293 mg/dL (ref 603–1613)
IgM (Immunoglobulin M), Srm: 20 mg/dL (ref 15–143)

## 2024-02-04 ENCOUNTER — Inpatient Hospital Stay: Admitting: Hematology

## 2024-02-04 VITALS — BP 139/84 | HR 69 | Temp 97.9°F | Resp 16 | Wt 161.8 lb

## 2024-02-04 DIAGNOSIS — C9110 Chronic lymphocytic leukemia of B-cell type not having achieved remission: Secondary | ICD-10-CM | POA: Diagnosis not present

## 2024-02-04 DIAGNOSIS — R0982 Postnasal drip: Secondary | ICD-10-CM | POA: Diagnosis not present

## 2024-02-04 DIAGNOSIS — Z803 Family history of malignant neoplasm of breast: Secondary | ICD-10-CM | POA: Diagnosis not present

## 2024-02-04 DIAGNOSIS — F17290 Nicotine dependence, other tobacco product, uncomplicated: Secondary | ICD-10-CM | POA: Diagnosis not present

## 2024-02-04 NOTE — Patient Instructions (Addendum)
 [facility] at [facility] Discharge Instructions   You were seen and examined today by Dr. Rogers.  He reviewed the results of your lab work which are normal/stable. It shows that the chronic leukemia you have is not aggressive. Treatment is not necessary at this time.   We will see you back in 3 months. We will repeat lab work prior to this visit.    Return as scheduled.    Thank you for choosing [facility] at [facility] to provide your oncology and hematology care.  To afford each patient quality time with our provider, please arrive at least 15 minutes before your scheduled appointment time.   If you have a lab appointment with the Cancer Center please come in thru the Main Entrance and check in at the main information desk.  You need to re-schedule your appointment should you arrive 10 or more minutes late.  We strive to give you quality time with our providers, and arriving late affects you and other patients whose appointments are after yours.  Also, if you no show three or more times for appointments you may be dismissed from the clinic at the providers discretion.     Again, thank you for choosing [facility].  Our hope is that these requests will decrease the amount of time that you wait before being seen by our physicians.       _____________________________________________________________  Should you have questions after your visit to [facility], please contact our office at [phone] and follow the prompts.  Our office hours are 8:00 a.m. and 4:30 p.m. Monday - Friday.  Please note that voicemails left after 4:00 p.m. may not be returned until the following business day.  We are closed weekends and major holidays.  You do have access to a nurse 24-7, just call the main number to the clinic [phone] and do not press any options, hold on the line and a nurse will answer the phone.    For  prescription refill requests, have your pharmacy contact our office and allow 72 hours.    Due to Covid, you will need to wear a mask upon entering the hospital. If you do not have a mask, a mask will be given to you at the Main Entrance upon arrival. For doctor visits, patients may have 1 support person age 59 or older with them. For treatment visits, patients can not have anyone with them due to social distancing guidelines and our immunocompromised population.

## 2024-02-04 NOTE — Progress Notes (Signed)
 [facility] [address] [place] [address]    Clinic Day:  02/04/2024  Referring physician: Alphonsa Glendia LABOR, MD  Patient Care Team: David Glendia LABOR, MD as PCP - General (Family Medicine)   ASSESSMENT & PLAN:   Assessment: 1.  Stage 0 CLL: -Lymphocyte predominant leukocytosis since 2019 -Leukocytosis first noted 11/17/2017 with total WBC 11.9 and absolute lymphocytes 5.9 -Most recent labs reviewed from 10/01/2020, WBC 16.5 with 74% lymphocytes, absolute lymphocytes 12.1. -No anemia or thrombocytopenia to date -No palpable lymphadenopathy, splenomegaly, hepatomegaly on exam -Abdominal ultrasound obtained 09/07/2020 was negative for splenomegaly - Flow cytometry on 10/11/2020 with monoclonal B-cell population, CD5 positive, CD19, CD20 positive consistent with CLL. - IGHV mutation: Hypermetabolism detected. - T p53 mutation: Negative. - CLL FISH panel: 42.5% of nuclei positive for 13 q. deletion.   2.  Social/family history: -Currently retired; previously worked in Runner, broadcasting/film/video for 20 years, plus several years working for [employer] -No known excessive pesticide or chemical exposure -Personal history of prostate cancer with total prostatectomy in 2011, no prior history of chemotherapy or radiation -Patient has sister with unspecified leukocytosis, another sister with melanoma, no other family history of cancer -History of alcohol use, no longer drinks alcohol -Uses chewing tobacco/snuff, non-smoker  Plan: 1.  Stage 0 CLL, IGHV mutated, T p53 negative: - He had sudden increase in white count to 52,000 from 16,000.  At that time he was treated with steroids. - He is continuing to have postnasal drip.  He will see ENT later this week. - I reviewed labs from 01/28/2024: White count improved to 30K from 52K previously.  Immunoglobulin levels were normal.  LDH was normal. - I have also discussed results of his CLL FISH panel, IGHV mutation and T p53 mutation.   His findings indicate that his CLL has good prognosis. - I do not recommend any treatment at this time.  Will repeat labs and follow-up visit in 3 months.  If the counts are stable and he does not have any symptoms, we will switch him back to every 49-month visits.  Orders Placed This Encounter  Procedures   CBC with Differential    Standing Status:   Future    Expected Date:   05/05/2024    Expiration Date:   08/03/2024   Comprehensive metabolic panel    Standing Status:   Future    Expected Date:   05/05/2024    Expiration Date:   08/03/2024   Lactate dehydrogenase    Standing Status:   Future    Expected Date:   05/05/2024    Expiration Date:   08/03/2024   Uric acid    Standing Status:   Future    Expected Date:   05/05/2024    Expiration Date:   08/03/2024    Release to patient:   Immediate      I,Helena R Teague,acting as a scribe for Alean Stands, MD.,have documented all relevant documentation on the behalf of Alean Stands, MD,as directed by  Alean Stands, MD while in the presence of Alean Stands, MD.  I, Alean Stands MD, have reviewed the above documentation for accuracy and completeness, and I agree with the above.    Alean Stands, MD   7/14/20251:15 PM  CHIEF COMPLAINT:   Diagnosis: Stage 0 CLL   Prior Therapy: None  Current Therapy: Observation   HISTORY OF PRESENT ILLNESS:   Oncology History   No history exists.     INTERVAL HISTORY:  David Pham is a 73 y.o. male presenting to clinic today for follow up of Stage 0 CLL. He was last seen by me on 12/11/23.  Today, he states that he is doing well overall. His appetite level is at 75%. His energy level is at 75%.  PAST MEDICAL HISTORY:   Past Medical History: Past Medical History:  Diagnosis Date   Cancer (HCC) 09/2009   prostate   Hemorrhoids    Incontinence of feces    Rectal pain    Toenail fungus 03/28/2020   I would not recommend oral medication due to  liver toxicity potential, treating topically   White coat hypertension     Surgical History: Past Surgical History:  Procedure Laterality Date   BIOPSY  11/17/2020   Procedure: BIOPSY;  Surgeon: Eartha Angelia Sieving, MD;  [facility]: [facility];  Service: Gastroenterology;;   COLONOSCOPY  2006 and 2012   COLONOSCOPY  08/04/2010   COLONOSCOPY WITH PROPOFOL  N/A 11/17/2020   Procedure: COLONOSCOPY WITH PROPOFOL ;  Surgeon: Eartha Angelia Sieving, MD;  [facility]: [facility];  Service: Gastroenterology;  Laterality: N/A;  830   ESOPHAGOGASTRODUODENOSCOPY (EGD) WITH PROPOFOL  N/A 11/17/2020   Procedure: ESOPHAGOGASTRODUODENOSCOPY (EGD) WITH PROPOFOL ;  Surgeon: Eartha Angelia Sieving, MD;  [facility]: [facility];  Service: Gastroenterology;  Laterality: N/A;   PROSTATECTOMY     2011    Social History: Social History   Socioeconomic History   Marital status: Single    Spouse name: Not on file   Number of children: Not on file   Years of education: Not on file   Highest education level: Not on file  Occupational History   Not on file  Tobacco Use   Smoking status: Never   Smokeless tobacco: Current    Types: Snuff  Vaping Use   Vaping status: Never Used  Substance and Sexual Activity   Alcohol use: Never   Drug use: Never   Sexual activity: Not on file  Other Topics Concern   Not on file  Social History Narrative   Not on file   Social Drivers of Health   Financial Resource Strain: Low Risk  (02/23/2023)   Overall Financial Resource Strain (CARDIA)    Difficulty of Paying Living Expenses: Not hard at all  Food Insecurity: No Food Insecurity (02/23/2023)   Hunger Vital Sign    Worried About Running Out of Food in the Last Year: Never true    Ran Out of Food in the Last Year: Never true  Transportation Needs: No Transportation Needs (02/23/2023)   PRAPARE - Administrator, Civil Service (Medical): No    Lack of Transportation (Non-Medical): No  Physical  Activity: Sufficiently Active (02/23/2023)   Exercise Vital Sign    Days of Exercise per Week: 7 days    Minutes of Exercise per Session: 30 min  Stress: No Stress Concern Present (02/23/2023)   [employer] of Occupational Health - Occupational Stress Questionnaire    Feeling of Stress : Not at all  Social Connections: Moderately Isolated (02/23/2023)   Social Connection and Isolation Panel    Frequency of Communication with Friends and Family: More than three times a week    Frequency of Social Gatherings with Friends and Family: More than three times a week    Attends Religious Services: More than 4 times per year    Active Member of [employer] or Organizations: No    Attends Banker Meetings: Never    Marital Status: Divorced  Intimate Partner Violence: Not At Risk (02/23/2023)   Humiliation, Afraid, Rape, and Kick questionnaire    Fear of Current or Ex-Partner: No    Emotionally Abused: No    Physically Abused: No    Sexually Abused: No    Family History: Family History  Problem Relation Age of Onset   Cancer Sister        breast   Hypertension Brother    Diabetes Brother    Emphysema Mother    Asthma Father    Allergic rhinitis Father    Angioedema Neg Hx    Atopy Neg Hx    Eczema Neg Hx    Immunodeficiency Neg Hx    Urticaria Neg Hx     Current Medications:  Current Outpatient Medications:    acetaminophen (TYLENOL) 650 MG CR tablet, Take 650 mg by mouth every 8 (eight) hours as needed for pain., Disp: , Rfl:    amLODipine  (NORVASC ) 2.5 MG tablet, Take 1 tablet (2.5 mg total) by mouth daily., Disp: 100 tablet, Rfl: 2   azelastine  (ASTELIN ) 0.1 % nasal spray, Place 2 sprays into both nostrils 2 (two) times daily., Disp: 30 mL, Rfl: 12   Camphor-Menthol-Methyl Sal (SALONPAS) 3.07-29-08 % PTCH, Place 1 patch onto the skin daily as needed (back pain)., Disp: , Rfl:    Cholecalciferol (VITAMIN D3) 50 MCG (2000 UT) TABS, Take 2,000 Units by mouth in the morning.,  Disp: , Rfl:    fexofenadine  (ALLEGRA ) 180 MG tablet, Take 180 mg by mouth daily., Disp: , Rfl:    fluticasone  (FLONASE ) 50 MCG/ACT nasal spray, Place 2 sprays into both nostrils daily., Disp: 48 g, Rfl: 2   ipratropium (ATROVENT ) 0.06 % nasal spray, Place 2 sprays into both nostrils 4 (four) times daily as needed for rhinitis., Disp: 15 mL, Rfl: 0   Multiple Vitamins-Minerals (MULTIVITAMIN WITH MINERALS) tablet, Take 1 tablet by mouth daily., Disp: , Rfl:    omeprazole  (PRILOSEC) 40 MG capsule, Take 40 mg by mouth daily., Disp: , Rfl:    pantoprazole  (PROTONIX ) 40 MG tablet, Take 40 mg by mouth daily., Disp: , Rfl:    Polyethyl Glycol-Propyl Glycol (SYSTANE) 0.4-0.3 % SOLN, Place 1-2 drops into both eyes 3 (three) times daily as needed (dry/irritated eyes.)., Disp: , Rfl:    Polyvinyl Alcohol-Povidone (REFRESH OP), Place 1-2 drops into both eyes 3 (three) times daily as needed (dry/irritated eyes.)., Disp: , Rfl:    tadalafil  (CIALIS ) 20 MG tablet, Take 0.5-1 tablets (10-20 mg total) by mouth every other day as needed for erectile dysfunction., Disp: 5 tablet, Rfl: 1   vitamin B-12 (CYANOCOBALAMIN) 1000 MCG tablet, Take 1,000 mcg by mouth in the morning., Disp: , Rfl:    Allergies: Allergies  Allergen Reactions   Penicillins Other (See Comments) and Hives    Unknown reaction (possible rash)   Codeine Palpitations   Morphine And Codeine Palpitations    REVIEW OF SYSTEMS:   Review of Systems  Constitutional:  Negative for chills, fatigue and fever.  HENT:   Negative for lump/mass, mouth sores, nosebleeds, sore throat and trouble swallowing.   Eyes:  Negative for eye problems.  Respiratory:  Negative for cough and shortness of breath.   Cardiovascular:  Negative for chest pain, leg swelling and palpitations.  Gastrointestinal:  Negative for abdominal pain, constipation, diarrhea, nausea and vomiting.  Genitourinary:  Negative for bladder incontinence, difficulty urinating, dysuria,  frequency, hematuria and nocturia.   Musculoskeletal:  Negative for arthralgias, back pain, flank pain, myalgias and  neck pain.  Skin:  Negative for itching and rash.  Neurological:  Negative for dizziness, headaches and numbness.  Hematological:  Does not bruise/bleed easily.  Psychiatric/Behavioral:  Negative for depression, sleep disturbance and suicidal ideas. The patient is not nervous/anxious.   All other systems reviewed and are negative.    VITALS:   Blood pressure 139/84, pulse 69, temperature 97.9 F (36.6 C), temperature source Oral, resp. rate 16, weight 161 lb 13.1 oz (73.4 kg), SpO2 97%.  Wt Readings from Last 3 Encounters:  02/04/24 161 lb 13.1 oz (73.4 kg)  12/11/23 159 lb 13.3 oz (72.5 kg)  06/05/23 162 lb (73.5 kg)    Body mass index is 23.22 kg/m.  Performance status (ECOG): 0 - Asymptomatic  PHYSICAL EXAM:   Physical Exam Vitals and nursing note reviewed. Exam conducted with a chaperone present.  Constitutional:      Appearance: Normal appearance.  Cardiovascular:     Rate and Rhythm: Normal rate and regular rhythm.     Pulses: Normal pulses.     Heart sounds: Normal heart sounds.  Pulmonary:     Effort: Pulmonary effort is normal.     Breath sounds: Normal breath sounds.  Abdominal:     Palpations: Abdomen is soft. There is no hepatomegaly, splenomegaly or mass.     Tenderness: There is no abdominal tenderness.  Musculoskeletal:     Right lower leg: No edema.     Left lower leg: No edema.  Lymphadenopathy:     Cervical: No cervical adenopathy.     Right cervical: No superficial, deep or posterior cervical adenopathy.    Left cervical: No superficial, deep or posterior cervical adenopathy.     Upper Body:     Right upper body: No supraclavicular or axillary adenopathy.     Left upper body: No supraclavicular or axillary adenopathy.  Neurological:     General: No focal deficit present.     Mental Status: He is alert and oriented to person, place,  and time.  Psychiatric:        Mood and Affect: Mood normal.        Behavior: Behavior normal.     LABS:   CBC     Component Value Date/Time   WBC 30.1 (H) 01/28/2024 1038   RBC 4.28 01/28/2024 1038   HGB 13.1 01/28/2024 1038   HGB 14.1 02/06/2023 0814   HCT 41.2 01/28/2024 1038   HCT 41.8 02/06/2023 0814   PLT 199 01/28/2024 1038   PLT 211 02/06/2023 0814   MCV 96.3 01/28/2024 1038   MCV 93 02/06/2023 0814   MCH 30.6 01/28/2024 1038   MCHC 31.8 01/28/2024 1038   RDW 12.7 01/28/2024 1038   RDW 12.9 02/06/2023 0814   LYMPHSABS 25.8 (H) 01/28/2024 1038   LYMPHSABS 21.0 (H) 02/06/2023 0814   MONOABS 0.6 01/28/2024 1038   EOSABS 0.1 01/28/2024 1038   EOSABS 0.1 02/06/2023 0814   BASOSABS 0.1 01/28/2024 1038   BASOSABS 0.1 02/06/2023 0814    CMP      Component Value Date/Time   NA 136 12/06/2023 1323   NA 141 02/06/2023 0814   K 3.6 12/06/2023 1323   CL 104 12/06/2023 1323   CO2 24 12/06/2023 1323   GLUCOSE 114 (H) 12/06/2023 1323   BUN 11 12/06/2023 1323   BUN 9 02/06/2023 0814   CREATININE 0.89 12/06/2023 1323   CREATININE 0.85 10/27/2013 0703   CALCIUM 8.7 (L) 12/06/2023 1323   PROT 7.1 12/06/2023 1323  PROT 6.9 01/31/2022 0926   ALBUMIN 3.9 12/06/2023 1323   ALBUMIN 4.5 01/31/2022 0926   AST 21 12/06/2023 1323   ALT 19 12/06/2023 1323   ALKPHOS 64 12/06/2023 1323   BILITOT 0.6 12/06/2023 1323   BILITOT 0.9 01/31/2022 0926   GFRNONAA >60 12/06/2023 1323   GFRAA 89 08/02/2020 1347     No results found for: CEA1, CEA / No results found for: CEA1, CEA Lab Results  Component Value Date   PSA1 <0.1 01/31/2022   No results found for: CAN199 No results found for: CAN125  No results found for: TOTALPROTELP, ALBUMINELP, A1GS, A2GS, BETS, BETA2SER, GAMS, MSPIKE, SPEI No results found for: TIBC, FERRITIN, IRONPCTSAT Lab Results  Component Value Date   LDH 129 01/28/2024   LDH 142 12/06/2023   LDH 129 05/29/2023      STUDIES:   No results found.

## 2024-02-05 ENCOUNTER — Ambulatory Visit: Admitting: Hematology

## 2024-02-05 DIAGNOSIS — R5383 Other fatigue: Secondary | ICD-10-CM | POA: Diagnosis not present

## 2024-02-05 DIAGNOSIS — Z79899 Other long term (current) drug therapy: Secondary | ICD-10-CM | POA: Diagnosis not present

## 2024-02-06 ENCOUNTER — Encounter (INDEPENDENT_AMBULATORY_CARE_PROVIDER_SITE_OTHER): Payer: Self-pay | Admitting: Otolaryngology

## 2024-02-06 ENCOUNTER — Ambulatory Visit (INDEPENDENT_AMBULATORY_CARE_PROVIDER_SITE_OTHER): Admitting: Otolaryngology

## 2024-02-06 VITALS — BP 147/86 | HR 73

## 2024-02-06 DIAGNOSIS — R0982 Postnasal drip: Secondary | ICD-10-CM

## 2024-02-06 DIAGNOSIS — R490 Dysphonia: Secondary | ICD-10-CM | POA: Diagnosis not present

## 2024-02-06 DIAGNOSIS — H9313 Tinnitus, bilateral: Secondary | ICD-10-CM | POA: Diagnosis not present

## 2024-02-06 DIAGNOSIS — R0981 Nasal congestion: Secondary | ICD-10-CM | POA: Diagnosis not present

## 2024-02-06 DIAGNOSIS — J310 Chronic rhinitis: Secondary | ICD-10-CM

## 2024-02-06 DIAGNOSIS — R0989 Other specified symptoms and signs involving the circulatory and respiratory systems: Secondary | ICD-10-CM | POA: Diagnosis not present

## 2024-02-06 DIAGNOSIS — J383 Other diseases of vocal cords: Secondary | ICD-10-CM

## 2024-02-06 DIAGNOSIS — J3089 Other allergic rhinitis: Secondary | ICD-10-CM

## 2024-02-06 DIAGNOSIS — K219 Gastro-esophageal reflux disease without esophagitis: Secondary | ICD-10-CM

## 2024-02-06 MED ORDER — IPRATROPIUM BROMIDE 0.03 % NA SOLN: 2.0000 | Freq: Two times a day (BID) | NASAL | 12 refills | Status: AC

## 2024-02-06 MED ORDER — LEVOCETIRIZINE DIHYDROCHLORIDE 5 MG PO TABS: 5.0000 mg | ORAL_TABLET | Freq: Every evening | ORAL | 3 refills | Status: AC

## 2024-02-06 NOTE — Progress Notes (Addendum)
 ENT CONSULT:  Reason for Consult: hoarseness  chronic throat clearing  HPI: Discussed the use of AI scribe software for clinical note transcription with the patient, who gave verbal consent to proceed.  History of Present Illness David Pham is a 73 year old male who presents with hoarseness and throat clearing, chronic nasal congestion and post-nasal drainage. He also reports tinnitus bilaterally for years.   He experiences chronic sinus drainage leading to frequent throat clearing and hoarseness. There is a sensation of drainage constantly running down his throat. He uses a generic form of Flonase  nasal spray, but frequent use causes epistaxis. Claritin is taken but found ineffective. Afrin nasal spray is used occasionally for relief, but he limits its use to avoid dependency.  He denies heartburn but takes a 40 mg Omeprazole , which was increased from 20 mg. He has GI physician and had his esophagus stretched before.   He mentions a history of bilateral tinnitus, high-pitch sound, persisting for years, but no hearing test has been conducted.   He has a history of chronic lymphocytic leukemia and recalls an instance where antibiotics and prednisone  caused an increase in his white blood cell count. He is cautious about medications that might increase his heart rate. Per records his CLL is stage 0, never had chemo, being monitored with serial labs.   He underwent an endoscopy a few years ago alongside a colonoscopy and was informed of a slow digestive system after a gastric emptying study. He sometimes feels excessively full after eating quickly or in large amounts.   Records Reviewed:  [facility] Hematology note by Dr Rogers 02/04/24 1.  Stage 0 CLL: -Lymphocyte predominant leukocytosis since 2019 -Leukocytosis first noted 11/17/2017 with total WBC 11.9 and absolute lymphocytes 5.9 -Most recent labs reviewed from 10/01/2020, WBC 16.5 with 74% lymphocytes, absolute lymphocytes  12.1. -No anemia or thrombocytopenia to date -No palpable lymphadenopathy, splenomegaly, hepatomegaly on exam -Abdominal ultrasound obtained 09/07/2020 was negative for splenomegaly - Flow cytometry on 10/11/2020 with monoclonal B-cell population, CD5 positive, CD19, CD20 positive consistent with CLL. - IGHV mutation: Hypermetabolism detected. - T p53 mutation: Negative. - CLL FISH panel: 42.5% of nuclei positive for 13 q. deletion.   Past Medical History:  Diagnosis Date   Cancer (HCC) 09/2009   prostate   Hemorrhoids    Incontinence of feces    Rectal pain    Toenail fungus 03/28/2020   I would not recommend oral medication due to liver toxicity potential, treating topically   White coat hypertension     Past Surgical History:  Procedure Laterality Date   BIOPSY  11/17/2020   Procedure: BIOPSY;  Surgeon: Eartha Angelia Sieving, MD;  [facility];  Service: Gastroenterology;;   COLONOSCOPY  2006 and 2012   COLONOSCOPY  08/04/2010   COLONOSCOPY WITH PROPOFOL  N/A 11/17/2020   Procedure: COLONOSCOPY WITH PROPOFOL ;  Surgeon: Eartha Angelia Sieving, MD;  [facility];  Service: Gastroenterology;  Laterality: N/A;  830   ESOPHAGOGASTRODUODENOSCOPY (EGD) WITH PROPOFOL  N/A 11/17/2020   Procedure: ESOPHAGOGASTRODUODENOSCOPY (EGD) WITH PROPOFOL ;  Surgeon: Eartha Angelia Sieving, MD;  [facility];  Service: Gastroenterology;  Laterality: N/A;   PROSTATECTOMY     2011    Family History  Problem Relation Age of Onset   Cancer Sister        breast   Hypertension Brother    Diabetes Brother    Emphysema Mother    Asthma Father    Allergic rhinitis Father  Angioedema Neg Hx    Atopy Neg Hx    Eczema Neg Hx    Immunodeficiency Neg Hx    Urticaria Neg Hx     Social History:  reports that he has never smoked. His smokeless tobacco use includes snuff. He reports that he does not drink alcohol and does not use drugs.  Allergies:  Allergies   Allergen Reactions   Penicillins Other (See Comments) and Hives    Unknown reaction (possible rash)   Codeine Palpitations   Morphine And Codeine Palpitations    Medications: I have reviewed the patient's current medications.  The PMH, PSH, Medications, Allergies, and SH were reviewed and updated.  ROS: Constitutional: Negative for fever, weight loss and weight gain. Cardiovascular: Negative for chest pain and dyspnea on exertion. Respiratory: Is not experiencing shortness of breath at rest. Gastrointestinal: Negative for nausea and vomiting. Neurological: Negative for headaches. Psychiatric: The patient is not nervous/anxious  Blood pressure (!) 147/86, pulse 73, SpO2 96%. There is no height or weight on file to calculate BMI.  PHYSICAL EXAM:  Exam: General: Well-developed, well-nourished Communication and Voice: raspy Respiratory Respiratory effort: Equal inspiration and expiration without stridor Cardiovascular Peripheral Vascular: Warm extremities with equal color/perfusion Eyes: No nystagmus with equal extraocular motion bilaterally Neuro/Psych/Balance: Patient oriented to person, place, and time; Appropriate mood and affect; Gait is intact with no imbalance; Cranial nerves I-XII are intact Head and Face Inspection: Normocephalic and atraumatic without mass or lesion Palpation: Facial skeleton intact without bony stepoffs Salivary Glands: No mass or tenderness Facial Strength: Facial motility symmetric and full bilaterally ENT Pinna: External ear intact and fully developed External canal: Canal is patent with intact skin Tympanic Membrane: Clear and mobile External Nose: No scar or anatomic deformity Internal Nose: Septum is relatively straight. No polyp, or purulence. Mucosal edema and erythema present.  Bilateral inferior turbinate hypertrophy.  Lips, Teeth, and gums: Mucosa and teeth intact and viable TMJ: No pain to palpation with full mobility Oral  cavity/oropharynx: No erythema or exudate, no lesions present Nasopharynx: No mass or lesion with intact mucosa Hypopharynx: Intact mucosa without pooling of secretions Larynx Glottic: Full true vocal cord mobility without lesion or mass VF atrophy Supraglottic: Normal appearing epiglottis and AE folds Interarytenoid Space: Moderate pachydermia&edema Subglottic Space: Patent without lesion or edema Neck Neck and Trachea: Midline trachea without mass or lesion Thyroid: No mass or nodularity Lymphatics: No lymphadenopathy  Procedure: Preoperative diagnosis:chronic throat clearing   Postoperative diagnosis:   Same  Procedure: Flexible fiberoptic laryngoscopy  Surgeon: Elena Larry, MD  Anesthesia: Topical lidocaine  and Afrin Complications: None Condition is stable throughout exam  Indications and consent:  The patient presents to the clinic with above symptoms. Indirect laryngoscopy view was incomplete. Thus it was recommended that they undergo a flexible fiberoptic laryngoscopy. All of the risks, benefits, and potential complications were reviewed with the patient preoperatively and verbal informed consent was obtained.  Procedure: The patient was seated upright in the clinic. Topical lidocaine  and Afrin were applied to the nasal cavity. After adequate anesthesia had occurred, I then proceeded to pass the flexible telescope into the nasal cavity. The nasal cavity was patent without rhinorrhea or polyp. The nasopharynx was also patent without mass or lesion. The base of tongue was visualized and was normal. There were no signs of pooling of secretions in the piriform sinuses. The true vocal folds were mobile bilaterally. There were no signs of glottic or supraglottic mucosal lesion or mass. There was moderate interarytenoid pachydermia and  post cricoid edema. The telescope was then slowly withdrawn and the patient tolerated the procedure throughout.   Studies Reviewed: EGD  11/17/20   Assessment/Plan: Encounter Diagnoses  Name Primary?   Chronic throat clearing Yes   Post-nasal drip    Environmental and seasonal allergies    Chronic nasal congestion    Hoarseness    Dysphonia    Glottic insufficiency    Age-related vocal fold atrophy    Tinnitus of both ears     Assessment and Plan Assessment & Plan Chronic rhinitis with postnasal drip and chronic throat clearing  Chronic rhinitis with postnasal drip causing throat clearing and nasal congestion. Current medications ineffective. Afrin unsuitable for long-term use.  - Prescribed Atrovent  nasal spray for daily use. - Continue Flonase  with possible adjustment to avoid daily use to prevent epistaxis. - Recommend Xyzal  as an alternative allergy medication. - Advised nasal saline rinses to prevent nasal drainage.  GERD LPR and chronic throat clearing Flexible scope exam with findings c/w GERD LPR - continue Omeprazole  40 mg daily  -  Reflux Gourmet after meals - diet and lifestyle changes to minimize GERD - Refer to [employer] blog for dietary and lifestyle modifications/reflux cook book  Dysphonia/hoarseness  Age-related vocal cord atrophy noted on flexible scope exam, otherwise no masses or lesions and no paralysis  - we discussed voice therapy but he would like to hold off for now  Tinnitus b/l  Ringing in ears for years, likely indicative of hearing loss. No previous hearing test conducted. - Order hearing test.  Chronic lymphocytic leukemia (CLL) Chronic lymphocytic leukemia (CLL) noted. Current treatment plan should not interfere with CLL management. - see Hematology as scheduled   Thank you for allowing me to participate in the care of this patient. Please do not hesitate to contact me with any questions or concerns.   Elena Larry, MD Otolaryngology [facility] Phone: [phone] Fax: [phone]    02/06/2024, 2:10 PM

## 2024-02-06 NOTE — Patient Instructions (Addendum)
 Aureliano Med Nasal Saline Rinse   - start nasal saline rinses with NeilMed Bottle available over the counter or online to help with nasal congestion      [email or web address][employer][email or web address] - check out this website to learn more about reflux   -Avoid lying down for at least two hours after a meal or after drinking acidic beverages, like soda, or other caffeinated beverages. This can help to prevent stomach contents from flowing back into the esophagus. -Keep your head elevated while you sleep. Using an extra pillow or two can also help to prevent reflux. -Eat smaller and more frequent meals each day instead of a few large meals. This promotes digestion and can aid in preventing heartburn. -Wear loose-fitting clothes to ease pressure on the stomach, which can worsen heartburn and reflux. -Reduce excess weight around the midsection. This can ease pressure on the stomach. Such pressure can force some stomach contents back up the esophagus

## 2024-02-19 ENCOUNTER — Ambulatory Visit (INDEPENDENT_AMBULATORY_CARE_PROVIDER_SITE_OTHER): Admitting: Audiology

## 2024-02-19 DIAGNOSIS — H903 Sensorineural hearing loss, bilateral: Secondary | ICD-10-CM

## 2024-02-19 NOTE — Progress Notes (Unsigned)
  [address] [place], [place] [address] [phone]  Audiological Evaluation    Name: David Pham     DOB:   [birth year]      MRN:   [id number]                                                                                     Service Date: 02/19/2024     Accompanied by: ***   Patient comes today after {ENT providers:31223} sent a referral for a hearing evaluation due to concerns with tinnitus.   Symptoms Yes Details  Hearing loss  []    Tinnitus  []    Ear pain/ infections/pressure  []    Balance problems  []    Noise exposure history  []    Previous ear surgeries  []    Family history of hearing loss  []    Amplification  []    Other  []      Otoscopy: Right ear: Clear external ear canal and notable landmarks visualized on the tympanic membrane. Left ear:  Clear external ear canal and notable landmarks visualized on the tympanic membrane.  Tympanometry: Right ear: Type A- Normal external ear canal volume with normal middle ear pressure and tympanic membrane compliance. Left ear: Type A- Normal external ear canal volume with normal middle ear pressure and tympanic membrane compliance.  Distortion Product Otoacoustic Emissions: Frequencies tested 1.6-8kHz - Diagnostic Test (12 frequencies)   Results are considered present if DP-NF is above 6dB and DP isabove -10dB. Right ear: {DPOAE's:31368::Present from 1600-8000 Hz which is suggestive of normal outer hair cell function.} Left ear: {DPOAE's:31368::Present from 1600-8000 Hz which is suggestive of normal outer hair cell function.}  Pure tone Audiometry: Right ear- *** {hearing loss types:31372::sensorineural hearing loss} from *** Hz - *** Hz. Left ear-  *** {hearing loss types:31372::sensorineural hearing loss} from *** Hz - *** Hz.  Speech Audiometry: Right ear- {AUD SRT/SAT:19348}. Left ear-{AUD SRT/SAT:19348}.   Word Recognition Score Tested using {word lists:31376::NU-6 (recorded)} Right  ear: ***% was obtained at a presentation level of *** dBHL with contralateral masking which is deemed as  {word recognition score:31373}. Left ear: ***% was obtained at a presentation level of *** dBHL with contralateral masking which is deemed as  {word recognition score:31373}.   The hearing test results were completed {transducer options:31388} and results are deemed to be of {test reliability:31390::good reliability}. Test technique:  {audiometric test technique:31400::conventional}    Impression: {Word recognition Score interpretation:31432::There is not a significant difference in pure-tone thresholds between ears.,There is not a significant difference in the word recognition score in between ears. }   Recommendations: {Audiology Recommendations:31370::Follow up with ENT as scheduled for today.}   Dois Juarbe MARIE LEROUX-MARTINEZ, AUD

## 2024-02-27 ENCOUNTER — Encounter: Payer: Self-pay | Admitting: Audiology

## 2024-02-29 ENCOUNTER — Ambulatory Visit: Payer: Medicare Other

## 2024-03-07 DIAGNOSIS — R52 Pain, unspecified: Secondary | ICD-10-CM | POA: Diagnosis not present

## 2024-03-07 DIAGNOSIS — K649 Unspecified hemorrhoids: Secondary | ICD-10-CM | POA: Diagnosis not present

## 2024-03-07 DIAGNOSIS — C9110 Chronic lymphocytic leukemia of B-cell type not having achieved remission: Secondary | ICD-10-CM | POA: Diagnosis not present

## 2024-03-07 DIAGNOSIS — I10 Essential (primary) hypertension: Secondary | ICD-10-CM | POA: Diagnosis not present

## 2024-03-07 DIAGNOSIS — Z299 Encounter for prophylactic measures, unspecified: Secondary | ICD-10-CM | POA: Diagnosis not present

## 2024-03-31 DIAGNOSIS — C9110 Chronic lymphocytic leukemia of B-cell type not having achieved remission: Secondary | ICD-10-CM | POA: Diagnosis not present

## 2024-03-31 DIAGNOSIS — I10 Essential (primary) hypertension: Secondary | ICD-10-CM | POA: Diagnosis not present

## 2024-03-31 DIAGNOSIS — R490 Dysphonia: Secondary | ICD-10-CM | POA: Diagnosis not present

## 2024-03-31 DIAGNOSIS — Z299 Encounter for prophylactic measures, unspecified: Secondary | ICD-10-CM | POA: Diagnosis not present

## 2024-04-17 DIAGNOSIS — Z299 Encounter for prophylactic measures, unspecified: Secondary | ICD-10-CM | POA: Diagnosis not present

## 2024-04-17 DIAGNOSIS — C9110 Chronic lymphocytic leukemia of B-cell type not having achieved remission: Secondary | ICD-10-CM | POA: Diagnosis not present

## 2024-04-17 DIAGNOSIS — I10 Essential (primary) hypertension: Secondary | ICD-10-CM | POA: Diagnosis not present

## 2024-04-17 DIAGNOSIS — R350 Frequency of micturition: Secondary | ICD-10-CM | POA: Diagnosis not present

## 2024-04-29 ENCOUNTER — Inpatient Hospital Stay: Attending: Hematology

## 2024-04-29 DIAGNOSIS — F17220 Nicotine dependence, chewing tobacco, uncomplicated: Secondary | ICD-10-CM | POA: Diagnosis not present

## 2024-04-29 DIAGNOSIS — M549 Dorsalgia, unspecified: Secondary | ICD-10-CM | POA: Diagnosis not present

## 2024-04-29 DIAGNOSIS — C9110 Chronic lymphocytic leukemia of B-cell type not having achieved remission: Secondary | ICD-10-CM | POA: Diagnosis present

## 2024-04-29 DIAGNOSIS — Z8546 Personal history of malignant neoplasm of prostate: Secondary | ICD-10-CM | POA: Diagnosis not present

## 2024-04-29 LAB — COMPREHENSIVE METABOLIC PANEL WITH GFR
ALT: 14 U/L (ref 0–44)
AST: 21 U/L (ref 15–41)
Albumin: 4.3 g/dL (ref 3.5–5.0)
Alkaline Phosphatase: 72 U/L (ref 38–126)
Anion gap: 10 (ref 5–15)
BUN: 13 mg/dL (ref 8–23)
CO2: 24 mmol/L (ref 22–32)
Calcium: 9.2 mg/dL (ref 8.9–10.3)
Chloride: 106 mmol/L (ref 98–111)
Creatinine, Ser: 0.87 mg/dL (ref 0.61–1.24)
GFR, Estimated: >60 mL/min (ref >60)
Glucose, Bld: 86 mg/dL (ref 70–99)
Potassium: 3.9 mmol/L (ref 3.5–5.1)
Sodium: 140 mmol/L (ref 135–145)
Total Bilirubin: 0.6 mg/dL (ref 0.0–1.2)
Total Protein: 6.8 g/dL (ref 6.5–8.1)

## 2024-04-29 LAB — CBC WITH DIFFERENTIAL/PLATELET
Abs Immature Granulocytes: 0.03 K/uL (ref 0.00–0.07)
Basophils Absolute: 0.1 K/uL (ref 0.0–0.1)
Basophils Relative: 0 %
Eosinophils Absolute: 0.1 K/uL (ref 0.0–0.5)
Eosinophils Relative: 0 %
HCT: 41.2 % (ref 39.0–52.0)
Hemoglobin: 13.3 g/dL (ref 13.0–17.0)
Immature Granulocytes: 0 %
Lymphocytes Relative: 87 %
Lymphs Abs: 25.7 K/uL — ABNORMAL HIGH (ref 0.7–4.0)
MCH: 30.5 pg (ref 26.0–34.0)
MCHC: 32.3 g/dL (ref 30.0–36.0)
MCV: 94.5 fL (ref 80.0–100.0)
Monocytes Absolute: 0.7 K/uL (ref 0.1–1.0)
Monocytes Relative: 2 %
Neutro Abs: 3.1 K/uL (ref 1.7–7.7)
Neutrophils Relative %: 11 %
Platelets: 198 K/uL (ref 150–400)
RBC: 4.36 MIL/uL (ref 4.22–5.81)
RDW: 12.8 % (ref 11.5–15.5)
Smear Review: NORMAL
WBC: 29.7 K/uL — ABNORMAL HIGH (ref 4.0–10.5)
nRBC: 0 % (ref 0.0–0.2)

## 2024-04-29 LAB — URIC ACID: Uric Acid, Serum: 5 mg/dL (ref 3.7–8.6)

## 2024-04-29 LAB — LACTATE DEHYDROGENASE: LDH: 162 U/L (ref 98–192)

## 2024-05-06 ENCOUNTER — Ambulatory Visit: Admitting: Oncology

## 2024-05-06 ENCOUNTER — Inpatient Hospital Stay: Admitting: Oncology

## 2024-05-06 VITALS — BP 148/87 | HR 66 | Temp 98.7°F | Resp 18 | Ht 68.7 in | Wt 164.2 lb

## 2024-05-06 DIAGNOSIS — C9110 Chronic lymphocytic leukemia of B-cell type not having achieved remission: Secondary | ICD-10-CM

## 2024-05-06 NOTE — Patient Instructions (Signed)
  Cancer Center at [facility] Discharge Instructions   You were seen and examined today by Dr. Davonna.  She reviewed the results of your lab work which are normal/stable.   We will see you back in 6 months. We will repeat lab work prior to your visit.   Return as scheduled.    Thank you for choosing  Cancer Center at [facility] to provide your oncology and hematology care.  To afford each patient quality time with our provider, please arrive at least 15 minutes before your scheduled appointment time.   If you have a lab appointment with the Cancer Center please come in thru the Main Entrance and check in at the main information desk.  You need to re-schedule your appointment should you arrive 10 or more minutes late.  We strive to give you quality time with our providers, and arriving late affects you and other patients whose appointments are after yours.  Also, if you no show three or more times for appointments you may be dismissed from the clinic at the providers discretion.     Again, thank you for choosing [facility].  Our hope is that these requests will decrease the amount of time that you wait before being seen by our physicians.       _____________________________________________________________  Should you have questions after your visit to [facility], please contact our office at [phone] and follow the prompts.  Our office hours are 8:00 a.m. and 4:30 p.m. Monday - Friday.  Please note that voicemails left after 4:00 p.m. may not be returned until the following business day.  We are closed weekends and major holidays.  You do have access to a nurse 24-7, just call the main number to the clinic [phone] and do not press any options, hold on the line and a nurse will answer the phone.    For prescription refill requests, have your pharmacy contact our office and allow 72 hours.    Due to Covid, you will  need to wear a mask upon entering the hospital. If you do not have a mask, a mask will be given to you at the Main Entrance upon arrival. For doctor visits, patients may have 1 support person age 95 or older with them. For treatment visits, patients can not have anyone with them due to social distancing guidelines and our immunocompromised population.

## 2024-05-06 NOTE — Progress Notes (Signed)
 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 male was transferred to my care after his prior physician has left.   ASSESSMENT & PLAN:   Assessment & Plan: David Pham  is a 73 y.o. male with CLL  Assessment and Plan Assessment & Plan Chronic lymphocytic leukemia (CLL), B-cell type Diagnosed in 2022 with elevated white blood cell count. Current count is 29.7, lower than previous levels. Asymptomatic with normal hemoglobin and platelet counts. Life expectancy similar to normal population. RAI stage: 0  - Monitor for symptoms such as fever, chills, night sweats, weight loss, or loss of appetite. - Reassess in six months. - Advised to call if symptoms develop.  Return to clinic in 6 months with labs  History of prostate cancer Managed by urologist.  - Continue to follow with urology  Back pain Being managed with Tylenol 650 nightly and is planning to get a steroid shot  Tobacco use Patient uses tobacco chewable form  - Discussed the risk of tobacco use with increased risk of oral carcinoma.  Recommended to try to quit.   The patient understands the plans discussed today and is in agreement with them.  He knows to contact our office if he develops concerns prior to his next appointment.  20 minutes of total time was spent for this patient encounter, including preparation,review of records,  face-to-face counseling with the patient and coordination of care, physical exam, and documentation of the encounter.    David Pham,acting as a Neurosurgeon for Mickiel Dry, MD.,have documented all relevant documentation on the behalf of Mickiel Dry, MD,as directed by  Mickiel Dry, MD while in the presence of Mickiel Dry, MD.  I, Mickiel Dry MD, have reviewed the above  documentation for accuracy and completeness, and I agree with the above.    Mickiel Dry, MD  [facility] [place] - A DEPT OF [facility] [address] [place] [address] Dept: [phone] Dept Fax: [phone]   Orders Placed This Encounter  Procedures   CBC with Differential    Standing Status:   Future    Expected Date:   10/27/2024    Expiration Date:   01/25/2025   Comprehensive metabolic panel    Standing Status:   Future    Expected Date:   10/27/2024    Expiration Date:   01/25/2025   Lactate dehydrogenase    Standing Status:   Future    Expected Date:   10/27/2024    Expiration Date:   01/25/2025   Uric acid    Standing Status:   Future    Expected Date:   10/27/2024    Expiration Date:   01/25/2025    Release to patient:   Immediate     ONCOLOGY HISTORY:   I have reviewed his chart and materials related to his cancer extensively and collaborated history with the patient. 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 Small hepatic and RIGHT renal cysts. Suspected medical renal disease changes of both kidneys. -10/11/2020: Flow cytometry: Monoclonal B-cell population (86% of cells  in gated population) favoring CLL, positive for CD5, CD19, CD20, CD200, and Lambda.  -12/06/2023: Significant worsening of leukocytosis with WBC at 52.5 and lymphocytes at 48.8. -12/11/2023: IGHV mutation: Hypermetabolism detected. -12/11/2023:T p53 mutation: Negative. -12/11/2023: CLL FISH panel: 42.5% of nuclei positive for 13 q. deletion. -01/28/2024: Improved leukocytosis with WBC at 30.1 and lymphocytes at 25.8.    Current Treatment:  Surveillance  INTERVAL HISTORY:   Discussed the use of AI scribe software for clinical note  transcription with the patient, who gave verbal consent to proceed.  History of Present Illness David Pham is a 73 year old male with chronic lymphocytic leukemia who presents for routine follow-up and to establish care with me.  He was accompanied by his wife today.  He has a history of chronic lymphocytic leukemia diagnosed in 2022, with elevated white blood cell counts noted since 2019. His current white blood cell count is 29.7, which is lower than previous counts that reached up to 50. No symptoms such as fever, chills, night sweats, weight loss, or loss of appetite.  He has a history of prostate cancer diagnosed in 2011, for which he continues to follow up with his urologist annually for PSA checks, all of which have been normal.  He experiences chronic back pain, described as a 'real bad disc' in his back that has been present for over a year. The pain sometimes radiates to his stomach area and is exacerbated by standing for long periods. He has previously received two injections for this pain about five or six years ago, with the second injection providing some relief. He currently manages the pain with Tylenol Arthritis, taking 650 mg twice nightly.  He has a history of bladder issues for which he was prescribed medication to relax the bladder, but he did not fill the prescription due to cost. He reports a past episode of stomach tenderness, which has since resolved.  In terms of social history, he does not smoke but uses tobacco in chewable form, which he acknowledges increases his risk for oral cancer.    I have reviewed the past medical history, past surgical history, social history and family history with the patient and they are unchanged from previous note.  ALLERGIES:  is allergic to penicillins, codeine, and morphine and codeine.  MEDICATIONS:  Current Outpatient Medications  Medication Sig Dispense Refill   acetaminophen (TYLENOL) 650 MG CR tablet Take 650 mg by mouth  every 8 (eight) hours as needed for pain.     amLODipine  (NORVASC ) 2.5 MG tablet Take 1 tablet (2.5 mg total) by mouth daily. 100 tablet 2   azelastine  (ASTELIN ) 0.1 % nasal spray Place 2 sprays into both nostrils 2 (two) times daily. 30 mL 12   Camphor-Menthol-Methyl Sal (SALONPAS) 3.07-29-08 % PTCH Place 1 patch onto the skin daily as needed (back pain).     Cholecalciferol (VITAMIN D3) 50 MCG (2000 UT) TABS Take 2,000 Units by mouth in the morning.     fexofenadine  (ALLEGRA ) 180 MG tablet Take 180 mg by mouth daily.     fluticasone  (FLONASE ) 50 MCG/ACT nasal spray Place 2 sprays into both nostrils daily. 48 g 2   ipratropium (ATROVENT ) 0.03 % nasal spray Place 2 sprays into both nostrils every 12 (twelve) hours. 30 mL 12   levocetirizine (XYZAL  ALLERGY 24HR) 5 MG tablet Take 1 tablet (5 mg total) by mouth every evening. 30 tablet 3   Multiple Vitamins-Minerals (MULTIVITAMIN WITH MINERALS) tablet Take 1 tablet by mouth daily.  omeprazole  (PRILOSEC) 40 MG capsule Take 40 mg by mouth daily.     pantoprazole  (PROTONIX ) 40 MG tablet Take 40 mg by mouth daily.     Polyethyl Glycol-Propyl Glycol (SYSTANE) 0.4-0.3 % SOLN Place 1-2 drops into both eyes 3 (three) times daily as needed (dry/irritated eyes.).     Polyvinyl Alcohol-Povidone (REFRESH OP) Place 1-2 drops into both eyes 3 (three) times daily as needed (dry/irritated eyes.).     tadalafil  (CIALIS ) 20 MG tablet Take 0.5-1 tablets (10-20 mg total) by mouth every other day as needed for erectile dysfunction. 5 tablet 1   vitamin B-12 (CYANOCOBALAMIN) 1000 MCG tablet Take 1,000 mcg by mouth in the morning.     No current facility-administered medications for this visit.    REVIEW OF SYSTEMS:   Constitutional: Denies fevers, chills or abnormal weight loss Eyes: Denies blurriness of vision Ears, nose, mouth, throat, and face: Denies mucositis or sore throat Respiratory: Denies cough, dyspnea or wheezes Cardiovascular: Denies palpitation,  chest discomfort or lower extremity swelling Gastrointestinal:  Denies nausea, heartburn or change in bowel habits Skin: Denies abnormal skin rashes Lymphatics: Denies new lymphadenopathy or easy bruising Neurological:Denies numbness, tingling or new weaknesses Behavioral/Psych: Mood is stable, no new changes  All other systems were reviewed with the patient and are negative.   VITALS:  Blood pressure (!) 148/87, pulse 66, temperature 98.7 F (37.1 C), temperature source Tympanic, resp. rate 18, height 5' 8.7 (1.745 m), weight 164 lb 3.2 oz (74.5 kg), SpO2 100%.  Wt Readings from Last 3 Encounters:  05/06/24 164 lb 3.2 oz (74.5 kg)  02/04/24 161 lb 13.1 oz (73.4 kg)  12/11/23 159 lb 13.3 oz (72.5 kg)    Body mass index is 24.46 kg/m.  Performance status (ECOG): 1 - Symptomatic but completely ambulatory  PHYSICAL EXAM:   GENERAL:alert, no distress and comfortable SKIN: skin color, texture, turgor are normal, no rashes or significant lesions LYMPH:  no palpable lymphadenopathy in the cervical, axillary or inguinal LUNGS: clear to auscultation and percussion with normal breathing effort HEART: regular rate & rhythm and no murmurs and no lower extremity edema ABDOMEN:abdomen soft, non-tender and normal bowel sounds Musculoskeletal:no cyanosis of digits and no clubbing  NEURO: alert & oriented x 3 with fluent speech, no focal motor/sensory deficits  LABORATORY DATA:  I have reviewed the data as listed  Lab Results  Component Value Date   WBC 29.7 (H) 04/29/2024   NEUTROABS 3.1 04/29/2024   HGB 13.3 04/29/2024   HCT 41.2 04/29/2024   MCV 94.5 04/29/2024   PLT 198 04/29/2024      Chemistry      Component Value Date/Time   NA 140 04/29/2024 1033   NA 141 02/06/2023 0814   K 3.9 04/29/2024 1033   CL 106 04/29/2024 1033   CO2 24 04/29/2024 1033   BUN 13 04/29/2024 1033   BUN 9 02/06/2023 0814   CREATININE 0.87 04/29/2024 1033   CREATININE 0.85 10/27/2013 0703       Component Value Date/Time   CALCIUM 9.2 04/29/2024 1033   ALKPHOS 72 04/29/2024 1033   AST 21 04/29/2024 1033   ALT 14 04/29/2024 1033   BILITOT 0.6 04/29/2024 1033   BILITOT 0.9 01/31/2022 0926       RADIOGRAPHIC STUDIES: I have personally reviewed the radiological images as listed and agreed with the findings in the report.

## 2024-05-08 DIAGNOSIS — Z299 Encounter for prophylactic measures, unspecified: Secondary | ICD-10-CM | POA: Diagnosis not present

## 2024-05-08 DIAGNOSIS — R109 Unspecified abdominal pain: Secondary | ICD-10-CM | POA: Diagnosis not present

## 2024-05-08 DIAGNOSIS — C9110 Chronic lymphocytic leukemia of B-cell type not having achieved remission: Secondary | ICD-10-CM | POA: Diagnosis not present

## 2024-05-08 DIAGNOSIS — R52 Pain, unspecified: Secondary | ICD-10-CM | POA: Diagnosis not present

## 2024-05-08 DIAGNOSIS — K219 Gastro-esophageal reflux disease without esophagitis: Secondary | ICD-10-CM | POA: Diagnosis not present

## 2024-05-08 DIAGNOSIS — I10 Essential (primary) hypertension: Secondary | ICD-10-CM | POA: Diagnosis not present

## 2024-05-08 DIAGNOSIS — M545 Low back pain, unspecified: Secondary | ICD-10-CM | POA: Diagnosis not present

## 2024-10-28 ENCOUNTER — Inpatient Hospital Stay

## 2024-11-04 ENCOUNTER — Inpatient Hospital Stay: Admitting: Oncology
